# Patient Record
Sex: Male | Born: 1965 | Race: Asian | Hispanic: No | Marital: Married | Smoking: Never smoker
Health system: Southern US, Community
[De-identification: ages and names within clinical notes are randomized; demographics above are authoritative.]

## PROBLEM LIST (undated history)

## (undated) DIAGNOSIS — Z923 Personal history of irradiation: Secondary | ICD-10-CM

## (undated) DIAGNOSIS — I10 Essential (primary) hypertension: Secondary | ICD-10-CM

## (undated) DIAGNOSIS — C3490 Malignant neoplasm of unspecified part of unspecified bronchus or lung: Secondary | ICD-10-CM

## (undated) DIAGNOSIS — D492 Neoplasm of unspecified behavior of bone, soft tissue, and skin: Secondary | ICD-10-CM

## (undated) DIAGNOSIS — G939 Disorder of brain, unspecified: Secondary | ICD-10-CM

## (undated) HISTORY — DX: Neoplasm of unspecified behavior of bone, soft tissue, and skin: D49.2

## (undated) HISTORY — DX: Personal history of irradiation: Z92.3

## (undated) HISTORY — DX: Malignant neoplasm of unspecified part of unspecified bronchus or lung: C34.90

## (undated) HISTORY — DX: Disorder of brain, unspecified: G93.9

---

## 2013-11-05 ENCOUNTER — Emergency Department: Payer: Self-pay | Admitting: Emergency Medicine

## 2013-11-05 LAB — CBC
HCT: 45.6 % (ref 40.0–52.0)
HGB: 14.9 g/dL (ref 13.0–18.0)
MCH: 26.6 pg (ref 26.0–34.0)
MCHC: 32.6 g/dL (ref 32.0–36.0)
MCV: 82 fL (ref 80–100)
Platelet: 229 10*3/uL (ref 150–440)
RBC: 5.6 10*6/uL (ref 4.40–5.90)
RDW: 13.8 % (ref 11.5–14.5)
WBC: 9.6 10*3/uL (ref 3.8–10.6)

## 2013-11-05 LAB — BASIC METABOLIC PANEL
Anion Gap: 5 — ABNORMAL LOW (ref 7–16)
BUN: 15 mg/dL (ref 7–18)
Calcium, Total: 9.1 mg/dL (ref 8.5–10.1)
Chloride: 104 mmol/L (ref 98–107)
Co2: 30 mmol/L (ref 21–32)
Creatinine: 1.26 mg/dL (ref 0.60–1.30)
EGFR (African American): >60
EGFR (Non-African Amer.): >60
Glucose: 94 mg/dL (ref 65–99)
Osmolality: 278 (ref 275–301)
POTASSIUM: 3.2 mmol/L — AB (ref 3.5–5.1)
SODIUM: 139 mmol/L (ref 136–145)

## 2013-11-05 LAB — TROPONIN I

## 2014-02-11 ENCOUNTER — Ambulatory Visit: Payer: Self-pay | Admitting: Internal Medicine

## 2014-02-11 DIAGNOSIS — D492 Neoplasm of unspecified behavior of bone, soft tissue, and skin: Secondary | ICD-10-CM

## 2014-02-11 HISTORY — DX: Neoplasm of unspecified behavior of bone, soft tissue, and skin: D49.2

## 2014-03-04 LAB — CBC
HCT: 39.3 % — ABNORMAL LOW (ref 40.0–52.0)
HGB: 12.7 g/dL — ABNORMAL LOW (ref 13.0–18.0)
MCH: 25 pg — ABNORMAL LOW (ref 26.0–34.0)
MCHC: 32.2 g/dL (ref 32.0–36.0)
MCV: 78 fL — ABNORMAL LOW (ref 80–100)
PLATELETS: 400 10*3/uL (ref 150–440)
RBC: 5.07 10*6/uL (ref 4.40–5.90)
RDW: 15.2 % — AB (ref 11.5–14.5)
WBC: 15 10*3/uL — AB (ref 3.8–10.6)

## 2014-03-04 LAB — BASIC METABOLIC PANEL
Anion Gap: 11 (ref 7–16)
BUN: 17 mg/dL (ref 7–18)
CREATININE: 1.04 mg/dL (ref 0.60–1.30)
Calcium, Total: 9.8 mg/dL (ref 8.5–10.1)
Chloride: 96 mmol/L — ABNORMAL LOW (ref 98–107)
Co2: 28 mmol/L (ref 21–32)
EGFR (Non-African Amer.): >60
Glucose: 137 mg/dL — ABNORMAL HIGH (ref 65–99)
OSMOLALITY: 274 (ref 275–301)
POTASSIUM: 3.8 mmol/L (ref 3.5–5.1)
Sodium: 135 mmol/L — ABNORMAL LOW (ref 136–145)

## 2014-03-04 LAB — URINALYSIS, COMPLETE
BILIRUBIN, UR: NEGATIVE
BLOOD: NEGATIVE
Bacteria: NONE SEEN
Glucose,UR: NEGATIVE mg/dL (ref 0–75)
Ketone: NEGATIVE
LEUKOCYTE ESTERASE: NEGATIVE
NITRITE: NEGATIVE
PH: 6 (ref 4.5–8.0)
Protein: 30
RBC, UR: NONE SEEN /HPF (ref 0–5)
SQUAMOUS EPITHELIAL: NONE SEEN
Specific Gravity: 1.018 (ref 1.003–1.030)
WBC UR: NONE SEEN /HPF (ref 0–5)

## 2014-03-04 LAB — PRO B NATRIURETIC PEPTIDE: B-TYPE NATIURETIC PEPTID: 71 pg/mL (ref 0–125)

## 2014-03-04 LAB — TROPONIN I: Troponin-I: <0.02 ng/mL

## 2014-03-06 ENCOUNTER — Inpatient Hospital Stay: Payer: Self-pay | Admitting: Internal Medicine

## 2014-03-06 ENCOUNTER — Inpatient Hospital Stay (HOSPITAL_COMMUNITY)
Admission: AD | Admit: 2014-03-06 | Discharge: 2014-03-11 | DRG: 477 | Disposition: A | Discharge status: Home Health | Payer: BC Managed Care – PPO | Source: Other Acute Inpatient Hospital | Attending: Internal Medicine | Admitting: Internal Medicine

## 2014-03-06 DIAGNOSIS — D497 Neoplasm of unspecified behavior of endocrine glands and other parts of nervous system: Secondary | ICD-10-CM

## 2014-03-06 DIAGNOSIS — R279 Unspecified lack of coordination: Secondary | ICD-10-CM | POA: Diagnosis present

## 2014-03-06 DIAGNOSIS — R339 Retention of urine, unspecified: Secondary | ICD-10-CM | POA: Diagnosis present

## 2014-03-06 DIAGNOSIS — M8448XA Pathological fracture, other site, initial encounter for fracture: Secondary | ICD-10-CM | POA: Diagnosis present

## 2014-03-06 DIAGNOSIS — R29898 Other symptoms and signs involving the musculoskeletal system: Secondary | ICD-10-CM | POA: Diagnosis present

## 2014-03-06 DIAGNOSIS — G992 Myelopathy in diseases classified elsewhere: Secondary | ICD-10-CM | POA: Diagnosis present

## 2014-03-06 DIAGNOSIS — G959 Disease of spinal cord, unspecified: Secondary | ICD-10-CM | POA: Diagnosis present

## 2014-03-06 DIAGNOSIS — G9520 Unspecified cord compression: Secondary | ICD-10-CM

## 2014-03-06 DIAGNOSIS — D630 Anemia in neoplastic disease: Secondary | ICD-10-CM | POA: Diagnosis present

## 2014-03-06 DIAGNOSIS — C7951 Secondary malignant neoplasm of bone: Secondary | ICD-10-CM | POA: Diagnosis present

## 2014-03-06 DIAGNOSIS — G936 Cerebral edema: Secondary | ICD-10-CM | POA: Diagnosis present

## 2014-03-06 DIAGNOSIS — I10 Essential (primary) hypertension: Secondary | ICD-10-CM | POA: Diagnosis present

## 2014-03-06 DIAGNOSIS — C3490 Malignant neoplasm of unspecified part of unspecified bronchus or lung: Secondary | ICD-10-CM | POA: Diagnosis present

## 2014-03-06 DIAGNOSIS — K5900 Constipation, unspecified: Secondary | ICD-10-CM | POA: Diagnosis not present

## 2014-03-06 DIAGNOSIS — C799 Secondary malignant neoplasm of unspecified site: Secondary | ICD-10-CM

## 2014-03-06 DIAGNOSIS — Z800 Family history of malignant neoplasm of digestive organs: Secondary | ICD-10-CM | POA: Diagnosis not present

## 2014-03-06 DIAGNOSIS — C7952 Secondary malignant neoplasm of bone marrow: Principal | ICD-10-CM

## 2014-03-06 DIAGNOSIS — C7949 Secondary malignant neoplasm of other parts of nervous system: Secondary | ICD-10-CM | POA: Diagnosis present

## 2014-03-06 DIAGNOSIS — C7931 Secondary malignant neoplasm of brain: Secondary | ICD-10-CM | POA: Diagnosis present

## 2014-03-06 DIAGNOSIS — M545 Low back pain, unspecified: Secondary | ICD-10-CM | POA: Diagnosis present

## 2014-03-06 DIAGNOSIS — R531 Weakness: Secondary | ICD-10-CM | POA: Diagnosis present

## 2014-03-06 DIAGNOSIS — G8220 Paraplegia, unspecified: Secondary | ICD-10-CM | POA: Diagnosis not present

## 2014-03-06 HISTORY — DX: Essential (primary) hypertension: I10

## 2014-03-06 LAB — CBC WITH DIFFERENTIAL/PLATELET
BASOS ABS: 0.1 10*3/uL (ref 0.0–0.1)
Basophil %: 0.5 %
EOS PCT: 1.5 %
Eosinophil #: 0.2 10*3/uL (ref 0.0–0.7)
HCT: 36.7 % — ABNORMAL LOW (ref 40.0–52.0)
HGB: 11.7 g/dL — ABNORMAL LOW (ref 13.0–18.0)
Lymphocyte #: 1.8 10*3/uL (ref 1.0–3.6)
Lymphocyte %: 16.1 %
MCH: 24.9 pg — ABNORMAL LOW (ref 26.0–34.0)
MCHC: 31.8 g/dL — ABNORMAL LOW (ref 32.0–36.0)
MCV: 78 fL — ABNORMAL LOW (ref 80–100)
Monocyte #: 1.1 x10 3/mm — ABNORMAL HIGH (ref 0.2–1.0)
Monocyte %: 10.1 %
NEUTROS PCT: 71.8 %
Neutrophil #: 8.1 10*3/uL — ABNORMAL HIGH (ref 1.4–6.5)
PLATELETS: 364 10*3/uL (ref 150–440)
RBC: 4.7 10*6/uL (ref 4.40–5.90)
RDW: 14.8 % — ABNORMAL HIGH (ref 11.5–14.5)
WBC: 11.3 10*3/uL — AB (ref 3.8–10.6)

## 2014-03-06 LAB — BASIC METABOLIC PANEL
Anion Gap: 6 — ABNORMAL LOW (ref 7–16)
BUN: 14 mg/dL (ref 7–18)
CHLORIDE: 102 mmol/L (ref 98–107)
CREATININE: 1.06 mg/dL (ref 0.60–1.30)
Calcium, Total: 8.8 mg/dL (ref 8.5–10.1)
Co2: 29 mmol/L (ref 21–32)
EGFR (African American): >60
Glucose: 91 mg/dL (ref 65–99)
Osmolality: 274 (ref 275–301)
POTASSIUM: 3.9 mmol/L (ref 3.5–5.1)
Sodium: 137 mmol/L (ref 136–145)

## 2014-03-06 LAB — MAGNESIUM: MAGNESIUM: 1.9 mg/dL

## 2014-03-06 MED ORDER — TUBERCULIN PPD 5 UNIT/0.1ML ID SOLN
5.0000 [IU] | Freq: Once | INTRADERMAL | Status: DC
  Filled 2014-03-06: qty 0.1

## 2014-03-06 MED ORDER — DEXAMETHASONE SODIUM PHOSPHATE 4 MG/ML IJ SOLN
8.0000 mg | Freq: Two times a day (BID) | INTRAMUSCULAR | Status: DC
  Administered 2014-03-06 – 2014-03-07 (×2): 8 mg via INTRAVENOUS
  Filled 2014-03-06 (×2): qty 2

## 2014-03-06 MED ORDER — MORPHINE SULFATE 2 MG/ML IJ SOLN: 1.0000 mg | INTRAMUSCULAR | Status: DC | PRN

## 2014-03-06 MED ORDER — HEPARIN SODIUM (PORCINE) 5000 UNIT/ML IJ SOLN
5000.0000 [IU] | Freq: Three times a day (TID) | INTRAMUSCULAR | Status: DC
  Administered 2014-03-06 – 2014-03-07 (×2): 5000 [IU] via SUBCUTANEOUS
  Filled 2014-03-06 (×2): qty 1

## 2014-03-06 NOTE — Evaluation (Signed)
Received call from Dr. Manuella Ghazi at [facility] about patient with progressive lower extremity weakness. Was found to have multiple metastatic lesions on MRI of the spine yesterday (ER physician unclear at this MRI was obtained in the ER versus outpatient setting). MRI of the spine also showedT2 and L2 compression fractures as well as some canal stenosis. A followup chest CT in the ER today shows a 2.5 cm left upper lobe nodule concerning for bronchogenic carcinoma. Patient's been having some urinary retention. Worsening ability to ambulate.patient previously relatively healthy high functioning prior to onset of symptoms per report. Has received IV Decadron. Vital signs stable.Dr. Manuella Ghazi discuss case with neurosurgery via Dr. Ellene Route. His currently are recommending medical admission for workup of metastatic lesions.

## 2014-03-06 NOTE — H&P (Addendum)
Hospitalist Admission History and Physical  Patient name: Luis [name] Medical record number: [id number] Date of birth: 01-19-1966 Age: 48 y.o. Gender: male  Primary Care Provider: Pcp Not In System  Chief Complaint: back pain, weakness History of Present Illness:This is a 48 y.o. year old male with significant past medical history of HTN presenting with back pain, weakness. Pt states that he has had progressive back pain over the past 3-4 months. Pt states that he saw his PCP about the issue within the past 2-3 weeks. Had ? xrays that were benign. Was rxd anti inflammatories and tramadol/flexeril with minimal to mild improvement in sxs. Pt states that he developed progressive LE weakness and intermittent urinary retention over the course of the weekend. Also with abd pain, nausea, vomiting. Pt went to [facility] because of sxs. Was found to have multiple lytic and bony lesions on MRI L spine. Had follow up chest CT that showed roughly 3x2x1 cm RUL nodule concerning for ? Bronchogenic carcinoma with multiple pulmonary and bony nodules. Oncology was also consulted. SPEP and UPEP were ordered. Was planned for CT guided bx biopsy by H-O at [facility] However this was stopped given neuro complaints. PPD also ordered out of concern for possible TB (pt originally from [place]). Oncologist from [facility] discussed case with Dr. Ellene [name] from [facility] out of concern for sxs of spinal cord compression. Dr. Ellene [name] recommended transfer to [facility] for further eval to assess whether pt is surgical candidate. Pt received decadron 40mg  IV x1 prior to transfer. Reports minimal to mild improvement in sxs with decadron thus far. Currently with minimal abd pain, nausea, vomiting. Does have some mild leg pain bilaterally. Has not had colonoscopy. + family hx/o colon ca in mother (died from colon ca complications)  Assessment and Plan: Luis [name] is a 48 y.o. year old male presenting with back pain, weakness, lytic lesions   Active Problems:    Weakness   1-Weakness/back pain  -NS aware of case -noted lytic lesions on MRI and CT -f/u on recs -continue IV decadron  -H-O c/s while in house -PPD  2-Lytic lesions/chest mass -broad ddx including metatstatic and infectious etiologies -no pulm complaints currently  -resp isolation --PPD  -ca markers -f/u SPEP/UPEP from [facility] -f/u H-O recs  -may need pulm/ID input given ? Infectious etiology   3-HTN -continue home regimen   4-Urinary retention  -likely secondary to above -continue foley   FEN/GI: heart healthy diet  Prophylaxis: sub q heparin  Disposition: pending furhter evaluation  Code Status:Full Code    Patient Active Problem List   Diagnosis Date Noted  . Weakness 03/06/2014   Past Medical History: No past medical history on file.  Past Surgical History: No past surgical history on file.  Social History: History   Social History  . Marital Status: Married    Spouse Name: N/A    Number of Children: N/A  . Years of Education: N/A   Social History Main Topics  . Smoking status: Not on file  . Smokeless tobacco: Not on file  . Alcohol Use: Not on file  . Drug Use: Not on file  . Sexual Activity: Not on file   Other Topics Concern  . Not on file   Social History Narrative  . No narrative on file    Family History: No family history on file.  Allergies: Allergies not on file  Current Facility-Administered Medications  Medication Dose [name] Frequency Provider Last Rate Last Dose  . dexamethasone (DECADRON) injection 8 mg  8 mg Intravenous Q12H Shanda Howells, MD      . heparin injection 5,000 Units  5,000 Units Subcutaneous 3 times per day Shanda Howells, MD      . morphine 2 MG/ML injection 1-2 mg  1-2 mg Intravenous Q4H PRN Shanda Howells, MD       Review Of Systems: 12 point ROS negative except as noted above in HPI.  Physical Exam: Filed Vitals:   03/06/14 2208  BP: 154/99  Pulse: 112  Temp: 98.4 F (36.9 C)  Resp: 16     General: alert and cooperative HEENT: PERRLA and extra ocular movement intact Heart: S1, S2 normal, no murmur, rub or gallop, regular rate and rhythm Lungs: clear to auscultation, no wheezes or rales and unlabored breathing Abdomen: abdomen is soft without significant tenderness, masses, organomegaly or guarding Extremities: + weakness in LEs bilaterallyu  Skin:no rashes, no ecchymoses Neurology: CN, UEs WNL, + generalized weakness in LEs. No noted hypo/hypereflexia   Labs and Imaging:       Shanda Howells MD  Pager: [phone]

## 2014-03-07 ENCOUNTER — Inpatient Hospital Stay (HOSPITAL_COMMUNITY): Payer: BC Managed Care – PPO

## 2014-03-07 ENCOUNTER — Encounter (HOSPITAL_COMMUNITY)
Admission: AD | Disposition: A | Discharge status: Home Health | Payer: Self-pay | Source: Other Acute Inpatient Hospital | Attending: Internal Medicine

## 2014-03-07 ENCOUNTER — Encounter (HOSPITAL_COMMUNITY): Payer: BC Managed Care – PPO | Admitting: Anesthesiology

## 2014-03-07 ENCOUNTER — Encounter (HOSPITAL_COMMUNITY): Payer: Self-pay | Admitting: *Deleted

## 2014-03-07 ENCOUNTER — Inpatient Hospital Stay (HOSPITAL_COMMUNITY): Payer: BC Managed Care – PPO | Admitting: Anesthesiology

## 2014-03-07 DIAGNOSIS — R5383 Other fatigue: Secondary | ICD-10-CM

## 2014-03-07 DIAGNOSIS — R5381 Other malaise: Secondary | ICD-10-CM

## 2014-03-07 DIAGNOSIS — G959 Disease of spinal cord, unspecified: Secondary | ICD-10-CM

## 2014-03-07 DIAGNOSIS — D497 Neoplasm of unspecified behavior of endocrine glands and other parts of nervous system: Secondary | ICD-10-CM

## 2014-03-07 HISTORY — PX: LAMINECTOMY: SHX219

## 2014-03-07 LAB — CBC
HCT: 39.2 % (ref 39.0–52.0)
Hemoglobin: 12.7 g/dL — ABNORMAL LOW (ref 13.0–17.0)
MCH: 25.1 pg — AB (ref 26.0–34.0)
MCHC: 32.4 g/dL (ref 30.0–36.0)
MCV: 77.6 fL — AB (ref 78.0–100.0)
Platelets: 374 10*3/uL (ref 150–400)
RBC: 5.05 MIL/uL (ref 4.22–5.81)
RDW: 14.7 % (ref 11.5–15.5)
WBC: 9.4 10*3/uL (ref 4.0–10.5)

## 2014-03-07 LAB — CREATININE, SERUM
Creatinine, Ser: 0.82 mg/dL (ref 0.50–1.35)
GFR calc Af Amer: >90 mL/min (ref >90)
GFR calc non Af Amer: >90 mL/min (ref >90)

## 2014-03-07 LAB — PROTEIN ELECTROPHORESIS(ARMC)

## 2014-03-07 LAB — COMPREHENSIVE METABOLIC PANEL
ALK PHOS: 258 U/L — AB (ref 39–117)
ALT: 87 U/L — AB (ref 0–53)
AST: 53 U/L — AB (ref 0–37)
Albumin: 2.8 g/dL — ABNORMAL LOW (ref 3.5–5.2)
Anion gap: 12 (ref 5–15)
BUN: 15 mg/dL (ref 6–23)
CO2: 25 mEq/L (ref 19–32)
Calcium: 9.7 mg/dL (ref 8.4–10.5)
Chloride: 98 mEq/L (ref 96–112)
Creatinine, Ser: 0.76 mg/dL (ref 0.50–1.35)
GFR calc non Af Amer: >90 mL/min (ref >90)
GLUCOSE: 136 mg/dL — AB (ref 70–99)
POTASSIUM: 4.6 meq/L (ref 3.7–5.3)
SODIUM: 135 meq/L — AB (ref 137–147)
TOTAL PROTEIN: 8.5 g/dL — AB (ref 6.0–8.3)
Total Bilirubin: 0.3 mg/dL (ref 0.3–1.2)

## 2014-03-07 LAB — LACTATE DEHYDROGENASE: LDH: 213 U/L (ref 94–250)

## 2014-03-07 LAB — CBC WITH DIFFERENTIAL/PLATELET
Basophils Absolute: 0 10*3/uL (ref 0.0–0.1)
Basophils Relative: 0 % (ref 0–1)
EOS ABS: 0 10*3/uL (ref 0.0–0.7)
Eosinophils Relative: 0 % (ref 0–5)
HEMATOCRIT: 39.1 % (ref 39.0–52.0)
HEMOGLOBIN: 12.6 g/dL — AB (ref 13.0–17.0)
Lymphocytes Relative: 10 % — ABNORMAL LOW (ref 12–46)
Lymphs Abs: 0.9 10*3/uL (ref 0.7–4.0)
MCH: 24.6 pg — AB (ref 26.0–34.0)
MCHC: 32.2 g/dL (ref 30.0–36.0)
MCV: 76.4 fL — ABNORMAL LOW (ref 78.0–100.0)
MONO ABS: 0.1 10*3/uL (ref 0.1–1.0)
MONOS PCT: 1 % — AB (ref 3–12)
Neutro Abs: 7.8 10*3/uL — ABNORMAL HIGH (ref 1.7–7.7)
Neutrophils Relative %: 89 % — ABNORMAL HIGH (ref 43–77)
Platelets: 413 10*3/uL — ABNORMAL HIGH (ref 150–400)
RBC: 5.12 MIL/uL (ref 4.22–5.81)
RDW: 14.5 % (ref 11.5–15.5)
WBC: 8.8 10*3/uL (ref 4.0–10.5)

## 2014-03-07 LAB — HIV ANTIBODY (ROUTINE TESTING W REFLEX): HIV 1&2 Ab, 4th Generation: NONREACTIVE

## 2014-03-07 LAB — CANCER ANTIGEN 19-9: CA 19-9: 1.1 U/mL — ABNORMAL LOW (ref <35.0)

## 2014-03-07 LAB — CEA: CEA: 3.2 ng/mL (ref 0.0–5.0)

## 2014-03-07 SURGERY — THORACIC LAMINECTOMY FOR TUMOR
Anesthesia: General

## 2014-03-07 MED ORDER — PROPOFOL 10 MG/ML IV BOLUS
INTRAVENOUS | Status: DC | PRN
  Administered 2014-03-07: 200 mg via INTRAVENOUS

## 2014-03-07 MED ORDER — ONDANSETRON HCL 4 MG/2ML IJ SOLN
INTRAMUSCULAR | Status: DC | PRN
  Administered 2014-03-07: 4 mg via INTRAVENOUS

## 2014-03-07 MED ORDER — ONDANSETRON HCL 4 MG/2ML IJ SOLN: 4.0000 mg | INTRAMUSCULAR | Status: DC | PRN

## 2014-03-07 MED ORDER — METHOCARBAMOL 500 MG PO TABS
500.0000 mg | ORAL_TABLET | Freq: Four times a day (QID) | ORAL | Status: DC | PRN
  Administered 2014-03-07: 500 mg via ORAL

## 2014-03-07 MED ORDER — PHENOL 1.4 % MT LIQD: 1.0000 | OROMUCOSAL | Status: DC | PRN

## 2014-03-07 MED ORDER — PROPOFOL 10 MG/ML IV BOLUS
INTRAVENOUS | Status: AC
  Filled 2014-03-07: qty 20

## 2014-03-07 MED ORDER — 0.9 % SODIUM CHLORIDE (POUR BTL) OPTIME
TOPICAL | Status: DC | PRN
  Administered 2014-03-07: 1000 mL

## 2014-03-07 MED ORDER — GLYCOPYRROLATE 0.2 MG/ML IJ SOLN
INTRAMUSCULAR | Status: AC
  Filled 2014-03-07: qty 3

## 2014-03-07 MED ORDER — SODIUM CHLORIDE 0.9 % IJ SOLN: 3.0000 mL | INTRAMUSCULAR | Status: DC | PRN

## 2014-03-07 MED ORDER — BACITRACIN 50000 UNITS IM SOLR
INTRAMUSCULAR | Status: DC | PRN
  Administered 2014-03-07: 17:00:00

## 2014-03-07 MED ORDER — FENTANYL CITRATE 0.05 MG/ML IJ SOLN
INTRAMUSCULAR | Status: DC | PRN
  Administered 2014-03-07: 150 ug via INTRAVENOUS
  Administered 2014-03-07: 100 ug via INTRAVENOUS

## 2014-03-07 MED ORDER — HYDROCODONE-ACETAMINOPHEN 5-325 MG PO TABS: 1.0000 | ORAL_TABLET | ORAL | Status: DC | PRN

## 2014-03-07 MED ORDER — DEXAMETHASONE SODIUM PHOSPHATE 4 MG/ML IJ SOLN
INTRAMUSCULAR | Status: DC | PRN
  Administered 2014-03-07: 4 mg via INTRAVENOUS

## 2014-03-07 MED ORDER — MIDAZOLAM HCL 5 MG/5ML IJ SOLN
INTRAMUSCULAR | Status: DC | PRN
  Administered 2014-03-07: 2 mg via INTRAVENOUS

## 2014-03-07 MED ORDER — LIDOCAINE HCL (CARDIAC) 20 MG/ML IV SOLN
INTRAVENOUS | Status: AC
  Filled 2014-03-07: qty 10

## 2014-03-07 MED ORDER — SCOPOLAMINE 1 MG/3DAYS TD PT72
MEDICATED_PATCH | TRANSDERMAL | Status: DC | PRN
  Administered 2014-03-07: 1 via TRANSDERMAL

## 2014-03-07 MED ORDER — ROCURONIUM BROMIDE 50 MG/5ML IV SOLN
INTRAVENOUS | Status: AC
Start: 2014-03-07 — End: 2014-03-07
  Filled 2014-03-07: qty 1

## 2014-03-07 MED ORDER — ACETAMINOPHEN 325 MG PO TABS: 650.0000 mg | ORAL_TABLET | ORAL | Status: DC | PRN

## 2014-03-07 MED ORDER — LACTATED RINGERS IV SOLN
INTRAVENOUS | Status: DC | PRN
  Administered 2014-03-07: 16:00:00 via INTRAVENOUS

## 2014-03-07 MED ORDER — ACETAMINOPHEN 650 MG RE SUPP: 650.0000 mg | RECTAL | Status: DC | PRN

## 2014-03-07 MED ORDER — METHOCARBAMOL 500 MG PO TABS
ORAL_TABLET | ORAL | Status: AC
  Filled 2014-03-07: qty 1

## 2014-03-07 MED ORDER — LIDOCAINE HCL 4 % MT SOLN
OROMUCOSAL | Status: DC | PRN
  Administered 2014-03-07: 4 mL via TOPICAL

## 2014-03-07 MED ORDER — SCOPOLAMINE 1 MG/3DAYS TD PT72
MEDICATED_PATCH | TRANSDERMAL | Status: AC
  Filled 2014-03-07: qty 1

## 2014-03-07 MED ORDER — LIDOCAINE-EPINEPHRINE 1 %-1:100000 IJ SOLN
INTRAMUSCULAR | Status: DC | PRN
  Administered 2014-03-07: 5 mL

## 2014-03-07 MED ORDER — GADOBENATE DIMEGLUMINE 529 MG/ML IV SOLN
15.0000 mL | Freq: Once | INTRAVENOUS | Status: AC | PRN
  Administered 2014-03-07: 14 mL via INTRAVENOUS

## 2014-03-07 MED ORDER — NEOSTIGMINE METHYLSULFATE 10 MG/10ML IV SOLN
INTRAVENOUS | Status: AC
  Filled 2014-03-07: qty 1

## 2014-03-07 MED ORDER — HYDROMORPHONE HCL PF 1 MG/ML IJ SOLN
INTRAMUSCULAR | Status: AC
  Filled 2014-03-07: qty 1

## 2014-03-07 MED ORDER — THROMBIN 20000 UNITS EX SOLR
CUTANEOUS | Status: DC | PRN
  Administered 2014-03-07: 17:00:00 via TOPICAL

## 2014-03-07 MED ORDER — BUPIVACAINE HCL (PF) 0.5 % IJ SOLN
INTRAMUSCULAR | Status: DC | PRN
Start: 2014-03-07 — End: 2014-03-07
  Administered 2014-03-07: 5 mL

## 2014-03-07 MED ORDER — SODIUM CHLORIDE 0.9 % IV SOLN: 250.0000 mL | INTRAVENOUS | Status: DC

## 2014-03-07 MED ORDER — ONDANSETRON HCL 4 MG/2ML IJ SOLN
INTRAMUSCULAR | Status: AC
  Filled 2014-03-07: qty 2

## 2014-03-07 MED ORDER — SODIUM CHLORIDE 0.9 % IJ SOLN
3.0000 mL | Freq: Two times a day (BID) | INTRAMUSCULAR | Status: DC
  Administered 2014-03-07 – 2014-03-10 (×5): 3 mL via INTRAVENOUS

## 2014-03-07 MED ORDER — PHENYLEPHRINE HCL 10 MG/ML IJ SOLN
10.0000 mg | INTRAVENOUS | Status: DC | PRN
  Administered 2014-03-07: 50 ug/min via INTRAVENOUS

## 2014-03-07 MED ORDER — HYDROMORPHONE HCL PF 1 MG/ML IJ SOLN
0.2500 mg | INTRAMUSCULAR | Status: DC | PRN
  Administered 2014-03-07 (×3): 0.5 mg via INTRAVENOUS

## 2014-03-07 MED ORDER — OXYCODONE-ACETAMINOPHEN 5-325 MG PO TABS
1.0000 | ORAL_TABLET | ORAL | Status: DC | PRN
  Administered 2014-03-08: 2 via ORAL
  Administered 2014-03-08: 1 via ORAL
  Administered 2014-03-08: 2 via ORAL
  Administered 2014-03-10: 1 via ORAL
  Administered 2014-03-10: 2 via ORAL
  Filled 2014-03-07: qty 1
  Filled 2014-03-07 (×3): qty 2
  Filled 2014-03-07: qty 1

## 2014-03-07 MED ORDER — DEXAMETHASONE SODIUM PHOSPHATE 4 MG/ML IJ SOLN
INTRAMUSCULAR | Status: AC
  Filled 2014-03-07: qty 1

## 2014-03-07 MED ORDER — DEXAMETHASONE SODIUM PHOSPHATE 4 MG/ML IJ SOLN
4.0000 mg | Freq: Four times a day (QID) | INTRAMUSCULAR | Status: DC
  Administered 2014-03-08 – 2014-03-10 (×12): 4 mg via INTRAVENOUS
  Filled 2014-03-07 (×12): qty 1

## 2014-03-07 MED ORDER — ROCURONIUM BROMIDE 100 MG/10ML IV SOLN
INTRAVENOUS | Status: DC | PRN
  Administered 2014-03-07: 50 mg via INTRAVENOUS

## 2014-03-07 MED ORDER — ARTIFICIAL TEARS OP OINT
TOPICAL_OINTMENT | OPHTHALMIC | Status: DC | PRN
  Administered 2014-03-07: 1 via OPHTHALMIC

## 2014-03-07 MED ORDER — GLYCOPYRROLATE 0.2 MG/ML IJ SOLN
INTRAMUSCULAR | Status: DC | PRN
  Administered 2014-03-07: 0.6 mg via INTRAVENOUS

## 2014-03-07 MED ORDER — ALUM & MAG HYDROXIDE-SIMETH 200-200-20 MG/5ML PO SUSP: 30.0000 mL | Freq: Four times a day (QID) | ORAL | Status: DC | PRN

## 2014-03-07 MED ORDER — METOPROLOL SUCCINATE ER 25 MG PO TB24
50.0000 mg | ORAL_TABLET | Freq: Every day | ORAL | Status: DC
  Administered 2014-03-07 – 2014-03-10 (×4): 50 mg via ORAL
  Filled 2014-03-07 (×4): qty 2

## 2014-03-07 MED ORDER — HYDROMORPHONE HCL PF 1 MG/ML IJ SOLN: 0.5000 mg | INTRAMUSCULAR | Status: DC | PRN

## 2014-03-07 MED ORDER — MENTHOL 3 MG MT LOZG: 1.0000 | LOZENGE | OROMUCOSAL | Status: DC | PRN

## 2014-03-07 MED ORDER — NEOSTIGMINE METHYLSULFATE 10 MG/10ML IV SOLN
INTRAVENOUS | Status: DC | PRN
  Administered 2014-03-07: 4 mg via INTRAVENOUS

## 2014-03-07 MED ORDER — METHOCARBAMOL 1000 MG/10ML IJ SOLN
500.0000 mg | Freq: Four times a day (QID) | INTRAMUSCULAR | Status: DC | PRN
  Filled 2014-03-07: qty 5

## 2014-03-07 MED ORDER — PROMETHAZINE HCL 25 MG/ML IJ SOLN: 6.2500 mg | INTRAMUSCULAR | Status: DC | PRN

## 2014-03-07 MED ORDER — LISINOPRIL 20 MG PO TABS
40.0000 mg | ORAL_TABLET | Freq: Every day | ORAL | Status: DC
Start: 2014-03-07 — End: 2014-03-11
  Administered 2014-03-07 – 2014-03-11 (×5): 40 mg via ORAL
  Filled 2014-03-07: qty 4
  Filled 2014-03-07 (×4): qty 2

## 2014-03-07 MED ORDER — METOPROLOL TARTRATE 1 MG/ML IV SOLN
INTRAVENOUS | Status: DC | PRN
  Administered 2014-03-07: 5 mg via INTRAVENOUS

## 2014-03-07 MED ORDER — FENTANYL CITRATE 0.05 MG/ML IJ SOLN
INTRAMUSCULAR | Status: AC
  Filled 2014-03-07: qty 5

## 2014-03-07 MED ORDER — CEFAZOLIN SODIUM-DEXTROSE 2-3 GM-% IV SOLR
INTRAVENOUS | Status: DC | PRN
  Administered 2014-03-07: 2 g via INTRAVENOUS

## 2014-03-07 MED ORDER — OXYCODONE HCL 5 MG PO TABS
ORAL_TABLET | ORAL | Status: AC
  Filled 2014-03-07: qty 1

## 2014-03-07 MED ORDER — CEFAZOLIN SODIUM 1-5 GM-% IV SOLN
1.0000 g | Freq: Three times a day (TID) | INTRAVENOUS | Status: AC
  Administered 2014-03-08 (×2): 1 g via INTRAVENOUS
  Filled 2014-03-07 (×2): qty 50

## 2014-03-07 MED ORDER — OXYCODONE HCL 5 MG PO TABS
5.0000 mg | ORAL_TABLET | Freq: Once | ORAL | Status: AC | PRN
  Administered 2014-03-07: 5 mg via ORAL

## 2014-03-07 MED ORDER — MIDAZOLAM HCL 2 MG/2ML IJ SOLN
INTRAMUSCULAR | Status: AC
  Filled 2014-03-07: qty 2

## 2014-03-07 MED ORDER — OXYCODONE HCL 5 MG/5ML PO SOLN: 5.0000 mg | Freq: Once | ORAL | Status: AC | PRN

## 2014-03-07 MED ORDER — METOPROLOL TARTRATE 1 MG/ML IV SOLN
INTRAVENOUS | Status: AC
  Filled 2014-03-07: qty 5

## 2014-03-07 MED ORDER — HYDROCHLOROTHIAZIDE 25 MG PO TABS
25.0000 mg | ORAL_TABLET | Freq: Every day | ORAL | Status: DC
  Administered 2014-03-07 – 2014-03-11 (×5): 25 mg via ORAL
  Filled 2014-03-07 (×5): qty 1

## 2014-03-07 MED ORDER — THROMBIN 5000 UNITS EX SOLR
OROMUCOSAL | Status: DC | PRN
  Administered 2014-03-07 (×2): via TOPICAL

## 2014-03-07 MED ORDER — ALBUMIN HUMAN 5 % IV SOLN
INTRAVENOUS | Status: DC | PRN
  Administered 2014-03-07 (×2): via INTRAVENOUS

## 2014-03-07 MED ORDER — PHENYLEPHRINE HCL 10 MG/ML IJ SOLN
INTRAMUSCULAR | Status: DC | PRN
  Administered 2014-03-07: 160 ug via INTRAVENOUS
  Administered 2014-03-07 (×2): 120 ug via INTRAVENOUS

## 2014-03-07 SURGICAL SUPPLY — 75 items
BAG DECANTER FOR FLEXI CONT (MISCELLANEOUS) ×3 IMPLANT
BENZOIN TINCTURE PRP APPL 2/3 (GAUZE/BANDAGES/DRESSINGS) IMPLANT
BLADE SURG 11 STRL SS (BLADE) IMPLANT
BLADE SURG ROTATE 9660 (MISCELLANEOUS) ×3 IMPLANT
BUR MATCHSTICK NEURO 3.0 LAGG (BURR) ×3 IMPLANT
CANISTER SUCT 3000ML (MISCELLANEOUS) ×3 IMPLANT
CATH ROBINSON RED A/P 10FR (CATHETERS) IMPLANT
CLIP TI MEDIUM 6 (CLIP) IMPLANT
CLOSURE WOUND 1/2 X4 (GAUZE/BANDAGES/DRESSINGS)
CONT SPEC 4OZ CLIKSEAL STRL BL (MISCELLANEOUS) ×3 IMPLANT
DECANTER SPIKE VIAL GLASS SM (MISCELLANEOUS) ×3 IMPLANT
DERMABOND ADVANCED (GAUZE/BANDAGES/DRESSINGS) ×2
DERMABOND ADVANCED .7 DNX12 (GAUZE/BANDAGES/DRESSINGS) ×1 IMPLANT
DRAPE LAPAROTOMY 100X72 PEDS (DRAPES) IMPLANT
DRAPE LAPAROTOMY 100X72X124 (DRAPES) ×3 IMPLANT
DRAPE LONG LASER MIC (DRAPES) IMPLANT
DRAPE MICROSCOPE LEICA (MISCELLANEOUS) IMPLANT
DRAPE POUCH INSTRU U-SHP 10X18 (DRAPES) ×3 IMPLANT
DRSG OPSITE POSTOP 4X6 (GAUZE/BANDAGES/DRESSINGS) ×3 IMPLANT
DURAPREP 26ML APPLICATOR (WOUND CARE) ×3 IMPLANT
ELECT REM PT RETURN 9FT ADLT (ELECTROSURGICAL) ×3
ELECTRODE REM PT RTRN 9FT ADLT (ELECTROSURGICAL) ×1 IMPLANT
EVACUATOR 1/8 PVC DRAIN (DRAIN) IMPLANT
EVACUATOR 3/16  PVC DRAIN (DRAIN) ×2
EVACUATOR 3/16 PVC DRAIN (DRAIN) ×1 IMPLANT
GAUZE SPONGE 4X4 12PLY STRL (GAUZE/BANDAGES/DRESSINGS) IMPLANT
GAUZE SPONGE 4X4 16PLY XRAY LF (GAUZE/BANDAGES/DRESSINGS) IMPLANT
GLOVE BIOGEL PI IND STRL 8.5 (GLOVE) ×1 IMPLANT
GLOVE BIOGEL PI INDICATOR 8.5 (GLOVE) ×2
GLOVE ECLIPSE 6.5 STRL STRAW (GLOVE) ×6 IMPLANT
GLOVE ECLIPSE 8.5 STRL (GLOVE) ×6 IMPLANT
GLOVE EXAM NITRILE LRG STRL (GLOVE) IMPLANT
GLOVE EXAM NITRILE MD LF STRL (GLOVE) IMPLANT
GLOVE EXAM NITRILE XL STR (GLOVE) IMPLANT
GLOVE EXAM NITRILE XS STR PU (GLOVE) IMPLANT
GOWN STRL REUS W/ TWL LRG LVL3 (GOWN DISPOSABLE) IMPLANT
GOWN STRL REUS W/ TWL XL LVL3 (GOWN DISPOSABLE) ×2 IMPLANT
GOWN STRL REUS W/TWL 2XL LVL3 (GOWN DISPOSABLE) ×6 IMPLANT
GOWN STRL REUS W/TWL LRG LVL3 (GOWN DISPOSABLE)
GOWN STRL REUS W/TWL XL LVL3 (GOWN DISPOSABLE) ×4
HEMOSTAT POWDER SURGIFOAM 1G (HEMOSTASIS) IMPLANT
HEMOSTAT SURGICEL 2X14 (HEMOSTASIS) ×3 IMPLANT
KIT BASIN OR (CUSTOM PROCEDURE TRAY) ×3 IMPLANT
KIT ROOM TURNOVER OR (KITS) ×3 IMPLANT
NEEDLE HYPO 22GX1.5 SAFETY (NEEDLE) ×3 IMPLANT
NEEDLE SPNL 18GX3.5 QUINCKE PK (NEEDLE) ×3 IMPLANT
NS IRRIG 1000ML POUR BTL (IV SOLUTION) ×3 IMPLANT
PACK LAMINECTOMY NEURO (CUSTOM PROCEDURE TRAY) IMPLANT
PAD ARMBOARD 7.5X6 YLW CONV (MISCELLANEOUS) ×9 IMPLANT
PATTIES SURGICAL .25X.25 (GAUZE/BANDAGES/DRESSINGS) IMPLANT
PATTIES SURGICAL .5 X1 (DISPOSABLE) IMPLANT
PATTIES SURGICAL .5 X3 (DISPOSABLE) IMPLANT
PATTIES SURGICAL 1/4 X 3 (GAUZE/BANDAGES/DRESSINGS) IMPLANT
PATTIES SURGICAL 1X1 (DISPOSABLE) ×3 IMPLANT
RUBBERBAND STERILE (MISCELLANEOUS) IMPLANT
SPECIMEN JAR SMALL (MISCELLANEOUS) IMPLANT
SPONGE LAP 4X18 X RAY DECT (DISPOSABLE) IMPLANT
SPONGE SURGIFOAM ABS GEL 100 (HEMOSTASIS) ×3 IMPLANT
STAPLER SKIN PROX WIDE 3.9 (STAPLE) ×3 IMPLANT
STRIP CLOSURE SKIN 1/2X4 (GAUZE/BANDAGES/DRESSINGS) IMPLANT
SUT NURALON 4 0 TR CR/8 (SUTURE) IMPLANT
SUT PDS AB 1 CTX 36 (SUTURE) IMPLANT
SUT PROLENE 6 0 BV (SUTURE) IMPLANT
SUT SILK 3 0 TIES 17X18 (SUTURE)
SUT SILK 3-0 18XBRD TIE BLK (SUTURE) IMPLANT
SUT VIC AB 1 CT1 18XBRD ANBCTR (SUTURE) ×1 IMPLANT
SUT VIC AB 1 CT1 8-18 (SUTURE) ×2
SUT VIC AB 2-0 CP2 18 (SUTURE) ×3 IMPLANT
SUT VIC AB 3-0 SH 8-18 (SUTURE) ×6 IMPLANT
SYR 20ML ECCENTRIC (SYRINGE) ×3 IMPLANT
SYR CONTROL 10ML LL (SYRINGE) ×3 IMPLANT
TOWEL OR 17X24 6PK STRL BLUE (TOWEL DISPOSABLE) ×3 IMPLANT
TOWEL OR 17X26 10 PK STRL BLUE (TOWEL DISPOSABLE) ×3 IMPLANT
TRAY FOLEY CATH 14FRSI W/METER (CATHETERS) IMPLANT
WATER STERILE IRR 1000ML POUR (IV SOLUTION) ×3 IMPLANT

## 2014-03-07 NOTE — Progress Notes (Signed)
Patient ID: Luis [name], male   DOB: 05-21-1966, 48 y.o.   MRN: [id number] Legs are moving well. Patient has moderate pain in back of neck but very tolerable at this time.  Discussed pathology and situation with wife and friend who interprets for the patient's wife. They will need much support in the coming weeks and months.

## 2014-03-07 NOTE — Transfer of Care (Signed)
Immediate Anesthesia Transfer of Care Note  Patient: Luis [name]  Procedure(s) Performed: Procedure(s): T1-T2 Laminectomy with Decompression of Cord and Removal of Cancer  thoracic one/two (N/A)  Patient Location: PACU  Anesthesia Type:General  Level of Consciousness: sedated  Airway & Oxygen Therapy: Patient Spontanous Breathing and Patient connected to nasal cannula oxygen  Post-op Assessment: Report given to PACU RN and Post -op Vital signs reviewed and stable  Post vital signs: Reviewed and stable  Complications: No apparent anesthesia complications

## 2014-03-07 NOTE — Progress Notes (Signed)
Report called from [place]. Was told in report that patient received PPD test at 1314 on 8/24. Patient arrived to floor via carelink. MD notified. Pt calm and cooperative. C/o lower extremity weakness and numbness. Seven medications were counted and taken down to pharmacy for keeping. Form for meds is in chart. Currently in room settled and resting. Will continue to monitor.

## 2014-03-07 NOTE — Consult Note (Signed)
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 male.  HPI: Patient is a 48 year old right-handed male who emigrated from [place] about 10 years ago. He notes that in the past several months he developed a vague pain across the base of the neck and shoulders. A few days ago he started developed weakness in his legs. He was last able to walk on Friday. Since about Saturday or Sunday he couldn't void. He was hospitalized at [facility] and x-rays were performed which demonstrated lesions in multiple vertebrae of the thoracic spine. He was to have a biopsy of her rib mass and a lung lesion. He was noted to be significantly weak, and house consults and yesterday. It took the patient about 12 hours to be transferred here. This morning early he underwent an MRI of the thoracic spine. This reveals bony destructive lesions in the vertebrae of T1-T2 T3-T4 and T5. Worst area of spinal cord compression is at T1-T2. He's been advised regarding the need for surgical decompression of his thoracic spinal cord at T1-T2 as he has significant weakness in the legs he has also had urinary retention. This will also provide tissues so that an adequate biopsy diagnosis of the exact nature of the lesion can be performed.  No past medical history on file.  No past surgical history on file.  No family history on file.  Social History:  reports that he has never smoked. He does not have any smokeless tobacco history on file. He reports that he drinks about .5 ounces of alcohol per week. His drug history is not on file.  Allergies: No Known Allergies  Medications: I have reviewed the patient's current medications.  Results for orders placed during the hospital encounter of 03/06/14 (from the past 48 hour(s))  CBC     Status: Abnormal   Collection Time    03/07/14 12:38 AM      Result Value Ref Range   WBC 9.4  4.0 - 10.5 K/uL   RBC  5.05  4.22 - 5.81 MIL/uL   Hemoglobin 12.7 (*) 13.0 - 17.0 g/dL   HCT 39.2  39.0 - 52.0 %   MCV 77.6 (*) 78.0 - 100.0 fL   MCH 25.1 (*) 26.0 - 34.0 pg   MCHC 32.4  30.0 - 36.0 g/dL   RDW 14.7  11.5 - 15.5 %   Platelets 374  150 - 400 K/uL  CREATININE, SERUM     Status: None   Collection Time    03/07/14 12:38 AM      Result Value Ref Range   Creatinine, Ser 0.82  0.50 - 1.35 mg/dL   GFR calc non Af Amer >90  >90 mL/min   GFR calc Af Amer >90  >90 mL/min   Comment: (NOTE)     The eGFR has been calculated using the CKD EPI equation.     This calculation has not been validated in all clinical situations.     eGFR's persistently <90 mL/min signify possible Chronic Kidney     Disease.  HIV ANTIBODY (ROUTINE TESTING)     Status: None   Collection Time    03/07/14 12:38 AM      Result Value Ref Range   HIV 1&2 Ab, 4th Generation NONREACTIVE  NONREACTIVE   Comment: (NOTE)     A NONREACTIVE HIV Ag/Ab result does not exclude HIV infection since     the time frame  for seroconversion is variable. If acute HIV infection     is suspected, a HIV-1 RNA Qualitative TMA test is recommended.     HIV-1/2 Antibody Diff         Not indicated.     HIV-1 RNA, Qual TMA           Not indicated.     PLEASE NOTE: This information has been disclosed to you from records     whose confidentiality may be protected by state law. If your state     requires such protection, then the state law prohibits you from making     any further disclosure of the information without the specific written     consent of the person to whom it pertains, or as otherwise permitted     by law. A general authorization for the release of medical or other     information is NOT sufficient for this purpose.     The performance of this assay has not been clinically validated in     patients less than 20 years old.     Performed at [facility]     Status: Abnormal   Collection Time    03/07/14   6:40 AM      Result Value Ref Range   Sodium 135 (*) 137 - 147 mEq/L   Potassium 4.6  3.7 - 5.3 mEq/L   Chloride 98  96 - 112 mEq/L   CO2 25  19 - 32 mEq/L   Glucose, Bld 136 (*) 70 - 99 mg/dL   BUN 15  6 - 23 mg/dL   Creatinine, Ser 0.76  0.50 - 1.35 mg/dL   Calcium 9.7  8.4 - 10.5 mg/dL   Total Protein 8.5 (*) 6.0 - 8.3 g/dL   Albumin 2.8 (*) 3.5 - 5.2 g/dL   AST 53 (*) 0 - 37 U/L   ALT 87 (*) 0 - 53 U/L   Alkaline Phosphatase 258 (*) 39 - 117 U/L   Total Bilirubin 0.3  0.3 - 1.2 mg/dL   GFR calc non Af Amer >90  >90 mL/min   GFR calc Af Amer >90  >90 mL/min   Comment: (NOTE)     The eGFR has been calculated using the CKD EPI equation.     This calculation has not been validated in all clinical situations.     eGFR's persistently <90 mL/min signify possible Chronic Kidney     Disease.   Anion gap 12  5 - 15  CBC WITH DIFFERENTIAL     Status: Abnormal   Collection Time    03/07/14  6:40 AM      Result Value Ref Range   WBC 8.8  4.0 - 10.5 K/uL   RBC 5.12  4.22 - 5.81 MIL/uL   Hemoglobin 12.6 (*) 13.0 - 17.0 g/dL   HCT 39.1  39.0 - 52.0 %   MCV 76.4 (*) 78.0 - 100.0 fL   MCH 24.6 (*) 26.0 - 34.0 pg   MCHC 32.2  30.0 - 36.0 g/dL   RDW 14.5  11.5 - 15.5 %   Platelets 413 (*) 150 - 400 K/uL   Neutrophils Relative % 89 (*) 43 - 77 %   Neutro Abs 7.8 (*) 1.7 - 7.7 K/uL   Lymphocytes Relative 10 (*) 12 - 46 %   Lymphs Abs 0.9  0.7 - 4.0 K/uL   Monocytes Relative 1 (*) 3 - 12 %  Monocytes Absolute 0.1  0.1 - 1.0 K/uL   Eosinophils Relative 0  0 - 5 %   Eosinophils Absolute 0.0  0.0 - 0.7 K/uL   Basophils Relative 0  0 - 1 %   Basophils Absolute 0.0  0.0 - 0.1 K/uL  LACTATE DEHYDROGENASE     Status: None   Collection Time    03/07/14  6:40 AM      Result Value Ref Range   LDH 213  94 - 250 U/L    Mr Thoracic Spine W Wo Contrast  03/07/2014   ADDENDUM REPORT: 03/07/2014 09:28  ADDENDUM: Critical Value/emergent results were called by telephone at the time of  interpretation on 03/07/2014 at 0923 hr to Dr. Charlies Silvers who verbally acknowledged these results.   Electronically Signed   By: Lars Pinks M.D.   On: 03/07/2014 09:28   03/07/2014   CLINICAL DATA:  48 year old male with progressive Back pain and paraplegia. 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  EXAM: MRI THORACIC SPINE WITHOUT AND WITH CONTRAST  TECHNIQUE: Multiplanar and multiecho pulse sequences of the thoracic spine were obtained without and with intravenous contrast.  CONTRAST:  47m MULTIHANCE GADOBENATE DIMEGLUMINE 529 MG/ML IV SOLN  COMPARISON:  Chest CT 03/06/2014 and earlier.  FINDINGS: Bulky enhancing tumor throughout the upper thoracic spine and paraspinal soft tissues. Confluent disease affecting the T1, T2, and T3 posterior elements and medial ribs, greater on the left. Nearly circumferential epidural tumor results, with mild cord compression at T2 (series 7, image 7), and T3 (series 7, image 11). Obliteration of the left T1 and T2 neural foramina.  Confluent tumor throughout the T4 vertebra. Ventral epidural extension on the right without cord compression at this time.  Pathologic compression fracture of T5, with bulky pedicle and posterior element tumor on the left resulting in mild cord compression (series 7, image 19). The left T5 neural foramen is obliterated.  Vertebral and epidural tumor at T6 without cord compression at this time.  Bulky and expansile right T7 rib mass (series 7, image 25). Similar expansile left T10 rib mass. Lower thoracic vertebral body metastases. Without additional epidural tumor  No abnormal intradural enhancement identified.  Stable lung parenchyma and visualized abdominal viscera from recent CTs.  IMPRESSION: 1. Confluent metastatic disease to the upper thoracic spine. Epidural and paraspinal tumor resulting in mild cord compression at the T2, T3, and T5 spinal cord levels. No cord edema identified. 2. Tumor filling the left T1, T2, and T5 neural foramina.  3. Epidural tumor without cord compression also at the T4 and T6 levels. 4. Metastatic destruction of several posterior ribs.  Electronically Signed: By: LLars PinksM.D. On: 03/07/2014 08:51    Review of Systems  Eyes: Negative.   Respiratory: Negative.   Cardiovascular: Negative.   Gastrointestinal: Negative.   Musculoskeletal: Positive for back pain and neck pain.  Skin: Negative.   Neurological: Positive for focal weakness and weakness.  Endo/Heme/Allergies: Negative.   Psychiatric/Behavioral: Negative.    Blood pressure 138/93, pulse 95, temperature 98.6 F (37 C), temperature source Oral, resp. rate 18, height 5' 7" (1.702 m), weight 65.137 kg (143 lb 9.6 oz), SpO2 100.00%. Physical Exam  Constitutional: He is oriented to person, place, and time. He appears well-developed and well-nourished.  HENT:  Head: Normocephalic and atraumatic.  Eyes: Conjunctivae and EOM are normal. Pupils are equal, round, and reactive to light.  Neck: Normal range of motion. Neck supple.  Cardiovascular: Normal rate and regular  rhythm.   Respiratory: Effort normal and breath sounds normal.  GI: Soft. Bowel sounds are normal.  Musculoskeletal:  Weakness in lower extremities proximally and distally to 4 minus out of 5 in iliopsoas and quads 4-5 in tibialis anterior gastrocs. Absent deep tendon reflexes in the patellae and Achilles both.  Neurological: He is alert and oriented to person, place, and time.  Cranial nerve examination revealed pupils are 3 mm briskly reactive light and accommodation extraocular movements are full face is symmetric to grimace tongue and uvula in the midline sclera and conjunctiva are clear there is no evidence of cortical drift in the upper extremities upper extremity strength is completely within the limits of normal lower external he strength reveals that there is weakness proximally and distally to 4-5. Is hyporeflexive. Sensory examination appears to be intact  Skin: Skin is  warm and dry.  Psychiatric: He has a normal mood and affect. His behavior is normal. Judgment and thought content normal.    Assessment/Plan: Metastatic lesion at T1 and T2.  Patient is to undergo surgical decompression via laminectomy at the T1-T2 level for ultimate diagnosis of what appears to be a malignancy in his thoracic spine.  Corie Allis J 03/07/2014, 3:01 PM

## 2014-03-07 NOTE — Progress Notes (Signed)
Patient ID: Luis [name], male   DOB: [birth year], 48 y.o.   MRN: [id number] [facility] PROGRESS NOTE  Luis [name] DUK:[id number] DOB: Feb 24, 1966 DOA: 03/06/2014 PCP: Pcp Not In System  Brief narrative: 48 year old male from [place] who presented to [facility] ED 03/06/2014 with vague neck and shoulder pain for past few months which has progressed to involve weakness in lower extremities. This then progressed to inability to void. He was hospitalized at [facility] and x-rays were performed which demonstrated lesions in multiple vertebrae of the thoracic spine. He was to have a biopsy of her rib mass and a lung lesion. He was noted to be significantly weak and has presented to [facility] 03/06/2014. MRI of thoracic spine which revealed bony destructive lesions in the vertebrae of T1-T2 T3-T4 and T5. Worst area of spinal cord compression is at T1-T2. Pt underwent surgical decompression via laminectomy at T1-T2 level 03/07/2014 by Dr. Ellene Route of neurosurgery.  Assessment/Plan:    Principal Problem: Lower extremity weakness with urinary retention in the setting of spinal cord compression at T1-T2  Status post surgical decompression via laminectomy at the T1-T2 level for ultimate diagnosis of what appears to be a malignancy in his thoracic spine.  Order also placed for radiation oncology.  Continue pain management efforts with dilaudid 0.5 mg IV PRN for severe pain, oxycodone PO PRN moderate pain; also on robaxin scheduled dose  Continue decadron 4 mg IV every 6 hours.  Active Problems: Hypertension  Continue lisinopril, hctz, metoprolol  DVT Prophylaxis   SCD's bilaterally    Code Status: Full.  Family Communication:  plan of care discussed with the patient Disposition Plan: Home when stable.    IV Access:   Peripheral IV Procedures and diagnostic studies:    Mr Thoracic Spine W Wo Contrast 03/07/2014   ADDENDUM REPORT: 03/07/2014 09:28  ADDENDUM: Critical Value/emergent results  were called by telephone at the time of interpretation on 03/07/2014 at 0923 hr to Dr. Charlies Silvers who verbally acknowledged these results.   Electronically Signed   By: Lars Pinks M.D.   On: 03/07/2014 09:28 1. Confluent metastatic disease to the upper thoracic spine. Epidural and paraspinal tumor resulting in mild cord compression at the T2, T3, and T5 spinal cord levels. No cord edema identified. 2. Tumor filling the left T1, T2, and T5 neural foramina. 3. Epidural tumor without cord compression also at the T4 and T6 levels. 4. Metastatic destruction of several posterior ribs.    Dg Thoracic Spine 1 View 03/07/2014: Limited exam showing no definite acute thoracic spine abnormalities.     Medical Consultants:   Neurosurgery (Dr. Ellene Route) Radiation oncology  Other Consultants:   Physical therapy  Anti-Infectives:   Cefazolin preoperatively and postoperatively 03/07/2014    Leisa Lenz, MD  [facility] Pager [phone]  If 7PM-7AM, please contact night-coverage [email or web address] Password [email or web address] 03/07/2014, 10:18 PM   LOS: 1 day    HPI/Subjective: No acute overnight events.  Objective: Filed Vitals:   03/07/14 2003 03/07/14 2015 03/07/14 2044 03/07/14 2156  BP: 115/73  118/82 139/82  Pulse:  96 98   Temp:  98.3 F (36.8 C) 97.8 F (36.6 C)   TempSrc:   Oral   Resp:  13 16   Height:      Weight:      SpO2:  100% 97%     Intake/Output Summary (Last 24 hours) at 03/07/14 2218 Last data filed at 03/07/14 2020  Gross per 24 hour  Intake   1730  ml  Output   3000 ml  Net  -1270 ml    Exam:   General:  Pt is alert, follows commands appropriately, not in acute distress  Cardiovascular: Regular rate and rhythm, S1/S2, no murmurs  Respiratory: Clear to auscultation bilaterally, no wheezing, no crackles, no rhonchi  Abdomen: Soft, non tender, non distended, bowel sounds present  Extremities: No edema, pulses DP and PT palpable bilaterally  Neuro: Grossly nonfocal  Data  Reviewed: Basic Metabolic Panel:  Recent Labs Lab 03/07/14 0038 03/07/14 0640  NA  --  135*  K  --  4.6  CL  --  98  CO2  --  25  GLUCOSE  --  136*  BUN  --  15  CREATININE 0.82 0.76  CALCIUM  --  9.7   Liver Function Tests:  Recent Labs Lab 03/07/14 0640  AST 53*  ALT 87*  ALKPHOS 258*  BILITOT 0.3  PROT 8.5*  ALBUMIN 2.8*   No results found for this basename: LIPASE, AMYLASE,  in the last 168 hours No results found for this basename: AMMONIA,  in the last 168 hours CBC:  Recent Labs Lab 03/07/14 0038 03/07/14 0640  WBC 9.4 8.8  NEUTROABS  --  7.8*  HGB 12.7* 12.6*  HCT 39.2 39.1  MCV 77.6* 76.4*  PLT 374 413*   Cardiac Enzymes: No results found for this basename: CKTOTAL, CKMB, CKMBINDEX, TROPONINI,  in the last 168 hours BNP: No components found with this basename: POCBNP,  CBG: No results found for this basename: GLUCAP,  in the last 168 hours  No results found for this or any previous visit (from the past 240 hour(s)).   Scheduled Meds: . [START ON 03/08/2014]  ceFAZolin (ANCEF) IV  1 g Intravenous Q8H  . hydrochlorothiazide  25 mg Oral Daily  . HYDROmorphone      . HYDROmorphone      . lisinopril  40 mg Oral Daily  . methocarbamol      . metoprolol succinate  50 mg Oral QHS  . oxyCODONE      . sodium chloride  3 mL Intravenous Q12H  . tuberculin  5 Units Intradermal Once   Continuous Infusions: . sodium chloride

## 2014-03-07 NOTE — Progress Notes (Signed)
Utilization Review Completed.Donne Anon T8/25/2015

## 2014-03-07 NOTE — Op Note (Signed)
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 Anesthesia: Gen. endotracheal Indications: Patient is a 48 year old male who had developed some upper neck and shoulder pain in addition to weakness in his legs his found to have deposits of cancer in multiple vertebrae and ribs and lesions lung. He's not had a tissue diagnosis. Because he has spinal cord compression and weakness in the legs he is taken to the operating room to undergo decompression at T1 and T2. The MRI demonstrates that he has expansive cancer involving only T1-T2 but also T3-T4 and T5.  Procedure: Patient was brought to the operating room supine on a stretcher. After the smooth induction of general endotracheal anesthesia, he was turned prone. The back was prepped with alcohol and DuraPrep in the region of the cervical thoracic junction. This region was draped sterilely. Prior to this the shoulders and arms had been padded and protected very carefully. These were placed at the patient's side. A localizing radiograph was obtained with a needle identifying the T1-T2 interspace. A vertical incision was created in the midline measuring 56 cm in length. Dissection was taken through the cervical dorsal fascia into the rectal dorsal fascia which was opened on either side of midline. Incision was extended to 10 cm. A subperiosteal dissection was then performed at T1-T2, however on the left side there was noted be substantially weakened cortical structure of bone at the level of T1 and T2 vertebrae. This was clearly invaded with a gelatinous material which was a cancerous lesion itself. This was resected and a laminectomy  of T1 and T2 were performed with a Leksell rongeur removing the very softened bone. The epidural space was identified. Gelatinous tumor was encountered in this region and it was sent for frozen section diagnosis which was later returned as a papillary malignant tumor. Many pieces of specimen were then sent for permanent section as a decompression ensued removing the laminar arches of L1 and L2 and decompressing the dura and the epidural space from left to right. The tumor extended into the muscular tissues inferiorly. This was partially resected but the tumor itself was proving to be rather hemorrhagic. A number of efforts at obtaining hemostasis were performed including packing with Gelfoam pledgets packing with Surgifoam and careful cautery of the tissues along with further packing methods. Ultimately it hemostasis was achieved. The dura was carefully inspected to make sure that the decompression was adequate at the T1 and T2 levels. This felt that the tumor did extend ventrally into the vertebral bodies. They still remained with some structure in the were left alone. Once adequate hemostasis was achieved, a large Hemovac drain was placed into the depths of the wound and brought out through separate stab incision. The thoracic of dorsal fascia was then closed with #1 Vicryl in interrupted fashion 2-0 Vicryl is used to keep pain is clean 3-0 Vicryl subcuticularly. Blood loss was estimated at 500 cc. The patient was returned to the recovery room in stable condition.

## 2014-03-07 NOTE — Anesthesia Procedure Notes (Addendum)
Procedure Name: Intubation Date/Time: 03/07/2014 4:10 PM Performed by: Trixie Deis A Pre-anesthesia Checklist: Patient identified, Timeout performed, Emergency Drugs available, Patient being monitored and Suction available Patient Re-evaluated:Patient Re-evaluated prior to inductionOxygen Delivery Method: Circle system utilized Preoxygenation: Pre-oxygenation with 100% oxygen Intubation Type: IV induction Ventilation: Mask ventilation without difficulty Laryngoscope Size: Mac and 3 Grade View: Grade I Tube type: Oral Tube size: 7.5 mm Number of attempts: 1 Airway Equipment and Method: Stylet and LTA kit utilized Placement Confirmation: ETT inserted through vocal cords under direct vision,  breath sounds checked- equal and bilateral and positive ETCO2 Secured at: 23 cm Tube secured with: Tape Dental Injury: Teeth and Oropharynx as per pre-operative assessment

## 2014-03-07 NOTE — Anesthesia Preprocedure Evaluation (Signed)
Anesthesia Evaluation  Patient identified by MRN, date of birth, ID band Patient awake    Reviewed: Allergy & Precautions, H&P , NPO status , Patient's Chart, lab work & pertinent test results  History of Anesthesia Complications Negative for: history of anesthetic complications  Airway Mallampati: II TM Distance: >3 FB Neck ROM: Full    Dental  (+) Teeth Intact, Dental Advisory Given   Pulmonary neg pulmonary ROS,    Pulmonary exam normal       Cardiovascular hypertension, Pt. on home beta blockers and Pt. on medications     Neuro/Psych negative psych ROS   GI/Hepatic negative GI ROS, Neg liver ROS,   Endo/Other  negative endocrine ROS  Renal/GU negative Renal ROS     Musculoskeletal   Abdominal   Peds  Hematology   Anesthesia Other Findings   Reproductive/Obstetrics                           Anesthesia Physical Anesthesia Plan  ASA: II  Anesthesia Plan: General   Post-op Pain Management:    Induction: Intravenous  Airway Management Planned: Oral ETT  Additional Equipment:   Intra-op Plan:   Post-operative Plan: Extubation in OR  Informed Consent: I have reviewed the patients History and Physical, chart, labs and discussed the procedure including the risks, benefits and alternatives for the proposed anesthesia with the patient or authorized representative who has indicated his/her understanding and acceptance.   Dental advisory given  Plan Discussed with: CRNA, Anesthesiologist and Surgeon  Anesthesia Plan Comments:         Anesthesia Quick Evaluation

## 2014-03-08 ENCOUNTER — Encounter (HOSPITAL_COMMUNITY): Payer: Self-pay | Admitting: Physical Medicine and Rehabilitation

## 2014-03-08 ENCOUNTER — Ambulatory Visit: Payer: BC Managed Care – PPO | Attending: Radiation Oncology | Admitting: Radiation Oncology

## 2014-03-08 DIAGNOSIS — D630 Anemia in neoplastic disease: Secondary | ICD-10-CM

## 2014-03-08 DIAGNOSIS — R350 Frequency of micturition: Secondary | ICD-10-CM

## 2014-03-08 DIAGNOSIS — I10 Essential (primary) hypertension: Secondary | ICD-10-CM | POA: Diagnosis present

## 2014-03-08 DIAGNOSIS — C801 Malignant (primary) neoplasm, unspecified: Secondary | ICD-10-CM

## 2014-03-08 DIAGNOSIS — D497 Neoplasm of unspecified behavior of endocrine glands and other parts of nervous system: Secondary | ICD-10-CM | POA: Diagnosis present

## 2014-03-08 DIAGNOSIS — D72819 Decreased white blood cell count, unspecified: Secondary | ICD-10-CM

## 2014-03-08 DIAGNOSIS — C7952 Secondary malignant neoplasm of bone marrow: Principal | ICD-10-CM

## 2014-03-08 DIAGNOSIS — C7951 Secondary malignant neoplasm of bone: Principal | ICD-10-CM

## 2014-03-08 DIAGNOSIS — M25519 Pain in unspecified shoulder: Secondary | ICD-10-CM

## 2014-03-08 DIAGNOSIS — R911 Solitary pulmonary nodule: Secondary | ICD-10-CM

## 2014-03-08 DIAGNOSIS — M549 Dorsalgia, unspecified: Secondary | ICD-10-CM

## 2014-03-08 LAB — COMPREHENSIVE METABOLIC PANEL
ALT: 65 U/L — ABNORMAL HIGH (ref 0–53)
ANION GAP: 11 (ref 5–15)
AST: 29 U/L (ref 0–37)
Albumin: 2.7 g/dL — ABNORMAL LOW (ref 3.5–5.2)
Alkaline Phosphatase: 180 U/L — ABNORMAL HIGH (ref 39–117)
BUN: 16 mg/dL (ref 6–23)
CHLORIDE: 100 meq/L (ref 96–112)
CO2: 26 meq/L (ref 19–32)
Calcium: 8.8 mg/dL (ref 8.4–10.5)
Creatinine, Ser: 0.76 mg/dL (ref 0.50–1.35)
Glucose, Bld: 123 mg/dL — ABNORMAL HIGH (ref 70–99)
Potassium: 4.3 mEq/L (ref 3.7–5.3)
Sodium: 137 mEq/L (ref 137–147)
Total Protein: 6.9 g/dL (ref 6.0–8.3)

## 2014-03-08 LAB — CBC WITH DIFFERENTIAL/PLATELET
BASOS ABS: 0 10*3/uL (ref 0.0–0.1)
Basophils Relative: 0 % (ref 0–1)
EOS PCT: 0 % (ref 0–5)
Eosinophils Absolute: 0 10*3/uL (ref 0.0–0.7)
HCT: 30.4 % — ABNORMAL LOW (ref 39.0–52.0)
Hemoglobin: 9.7 g/dL — ABNORMAL LOW (ref 13.0–17.0)
LYMPHS ABS: 1.1 10*3/uL (ref 0.7–4.0)
LYMPHS PCT: 6 % — AB (ref 12–46)
MCH: 24.7 pg — ABNORMAL LOW (ref 26.0–34.0)
MCHC: 31.9 g/dL (ref 30.0–36.0)
MCV: 77.4 fL — AB (ref 78.0–100.0)
Monocytes Absolute: 0.8 10*3/uL (ref 0.1–1.0)
Monocytes Relative: 4 % (ref 3–12)
NEUTROS ABS: 16.9 10*3/uL — AB (ref 1.7–7.7)
NEUTROS PCT: 90 % — AB (ref 43–77)
Platelets: 335 10*3/uL (ref 150–400)
RBC: 3.93 MIL/uL — AB (ref 4.22–5.81)
RDW: 14.6 % (ref 11.5–15.5)
WBC: 18.8 10*3/uL — ABNORMAL HIGH (ref 4.0–10.5)

## 2014-03-08 LAB — FREE PSA
PSA, Free Pct: 24 % (ref >25)
PSA, Free: 0.18 ng/mL

## 2014-03-08 LAB — PSA: PSA: 0.76 ng/mL (ref <=4.00)

## 2014-03-08 LAB — AFP TUMOR MARKER: AFP TUMOR MARKER: 1.9 ng/mL (ref <6.1)

## 2014-03-08 LAB — PROT IMMUNOELECTROPHORES(ARMC)

## 2014-03-08 MED ORDER — SODIUM CHLORIDE 0.9 % IV SOLN
INTRAVENOUS | Status: DC
  Administered 2014-03-08 (×3): via INTRAVENOUS

## 2014-03-08 NOTE — Consult Note (Signed)
Physical Medicine and Rehabilitation Consult  Reason for Consult: Metastatic thoracic cancer Referring Physician: Dr. Tana Coast.   HPI: Luis [name] is a 48 y.o. [place] male who developed vague neck and shoulder pain a few months ago. He started developing BLE weakness last week and was last able to walk on 03/03/14. He developed difficulty with urinary retention, constipation as well as abdominal pain. He was evaluated at [facility] and was found to have multiple lytic bony lesion involving Lumbar sine as well as RUL nodule concerning for bronchogenic CA. SPEP/UPEP ordered as well as  PPD due to concerns of possible TB. MRI spine done 03/07/14 revealing  destructive lesions in the vertebrae of T1-T2 T3-T4 and T5 as well as spinal cord compression --worse at T1-T2, bulky expansile T7 rib mass and metastatic destruction of several posterior ribs. He was transferred to [facility] for treatment. He was evaluated by Dr. Ellene Route and taken to OR for T1-T2 laminectomy and decompression of spinal cord, resection of malignant metastatic tumor. Frozen section positive for papillary malignant tumor and final pathology pending. Heme onc consulted for input and work up ongoing. PT evaluation done this am and patient continues to be limited by BLE weakness with ataxia. MD, PT recommending CIR.    Review of Systems  HENT: Negative for hearing loss.   Eyes: Negative for blurred vision and double vision.       Wears reading glasses  Respiratory: Negative for shortness of breath and wheezing.   Cardiovascular: Negative for palpitations and orthopnea.  Gastrointestinal: Negative for heartburn, nausea, abdominal pain and constipation (+BM today).  Genitourinary:       Urinary retention  Musculoskeletal: Positive for myalgias and neck pain.  Neurological: Positive for sensory change and focal weakness (BLE). Negative for headaches.  Psychiatric/Behavioral: Negative for hallucinations, memory loss and substance abuse.     Past  Medical History  Diagnosis Date  . HTN (hypertension)     No past surgical history on file.  Family History  Problem Relation Age of Onset  . Cancer Mother   . Heart disease Father   . Hypertension Sister   . Hypertension Brother     Social History:  Leanne Chang with wife and two children (18 months and 10  Years). Wife works but has supportive friends Works as a Architectural technologist at [employer]. Move from [place] 10 years ago. He reports that he has never smoked. He does not have any smokeless tobacco history on file. He reports that he drinks about .5 ounces of alcohol per week. His drug history is not on file.   Allergies: No Known Allergies   Medications Prior to Admission  Medication Sig Dispense Refill  . cyclobenzaprine (FLEXERIL) 5 MG tablet Take 5 mg by mouth every 8 (eight) hours as needed. For muscle spasms      . hydrochlorothiazide (HYDRODIURIL) 25 MG tablet Take 25 mg by mouth daily.      . hydrocortisone valerate ointment (WEST-CORT) 0.2 % Apply 1 application topically 2 (two) times daily. Applied to affected area twice daily      . lisinopril (PRINIVIL,ZESTRIL) 40 MG tablet Take 40 mg by mouth daily.      . meloxicam (MOBIC) 15 MG tablet Take 15 mg by mouth daily.      . metoprolol succinate (TOPROL-XL) 50 MG 24 hr tablet Take 50 mg by mouth at bedtime.      . traMADol (ULTRAM) 50 MG tablet Take 50 mg by mouth every 8 (eight)  hours as needed. For pain        Home: Home Living Family/patient expects to be discharged to:: Private residence Living Arrangements: Spouse/significant other;Children Available Help at Discharge: Family;Available 24 hours/day Type of Home: Apartment Home Access: Stairs to enter Entrance Stairs-Number of Steps: 1 (threshold step) Entrance Stairs-Rails: None Home Layout: One level Home Equipment: None Additional Comments: pt works a Architectural technologist at [employer] History: Prior Function Level of Independence:  Independent Functional Status:  Mobility: Bed Mobility Overal bed mobility: Needs Assistance Bed Mobility: Rolling;Sidelying to Sit Rolling: Supervision Sidelying to sit: Min guard General bed mobility comments: cues for log rolling technique; min guard to facilitate sidelying to sit position  Transfers Overall transfer level: Needs assistance Equipment used: 1 person hand held assist Transfers: Sit to/from Stand Sit to Stand: Min assist General transfer comment: (A) to maintain balance with sit to stand; reaching for UE support; pt with shaky; unsteady LEs due to generalized weakness  Ambulation/Gait Ambulation/Gait assistance: Mod assist Ambulation Distance (Feet): 10 Feet Assistive device: 1 person hand held assist (with IV support in opposite UE) Gait Pattern/deviations: Step-to pattern;Decreased stride length;Wide base of support;Ataxic (bil LEs locked in extension ) Gait velocity: very decr Gait velocity interpretation: Below normal speed for age/gender General Gait Details: pt unsteady with gt; keeping wide BOS and knees extended; mod (A) to maintain balance; pt with ataxic like/uncoordinated movements; given RW for support to transfer with nurses     ADL:    Cognition: Cognition Overall Cognitive Status: Within Functional Limits for tasks assessed Orientation Level: Oriented X4 Cognition Arousal/Alertness: Awake/alert Behavior During Therapy: WFL for tasks assessed/performed Overall Cognitive Status: Within Functional Limits for tasks assessed Memory: Decreased recall of precautions  Blood pressure 110/64, pulse 70, temperature 98.2 F (36.8 C), temperature source Oral, resp. rate 16, height 5\' 7"  (1.702 m), weight 67.087 kg (147 lb 14.4 oz), SpO2 100.00%. Physical Exam  Nursing note and vitals reviewed. Constitutional: He is oriented to person, place, and time. He appears well-developed and well-nourished.  HENT:  Head: Normocephalic and atraumatic.  Eyes: Pupils  are equal, round, and reactive to light.  Neck: Normal range of motion. Neck supple.  Cardiovascular: Normal rate and regular rhythm.   Respiratory: Effort normal and breath sounds normal. No respiratory distress. He has no wheezes.  GI: Bowel sounds are normal. He exhibits no distension. There is no tenderness.  Musculoskeletal: He exhibits no edema and no tenderness.  Surgical site tender.  Neurological: He is alert and oriented to person, place, and time.  UES 5/5. No sensory deficits. LE's: HF 4-, KE 4+, ADF/APF 4+. Senses light touch and pain. Mild pain with proximal leg movement.   Skin: Skin is warm and dry.  Psychiatric: He has a normal mood and affect. His behavior is normal. Judgment and thought content normal.    Results for orders placed during the hospital encounter of 03/06/14 (from the past 24 hour(s))  COMPREHENSIVE METABOLIC PANEL     Status: Abnormal   Collection Time    03/08/14  4:30 AM      Result Value Ref Range   Sodium 137  137 - 147 mEq/L   Potassium 4.3  3.7 - 5.3 mEq/L   Chloride 100  96 - 112 mEq/L   CO2 26  19 - 32 mEq/L   Glucose, Bld 123 (*) 70 - 99 mg/dL   BUN 16  6 - 23 mg/dL   Creatinine, Ser 0.76  0.50 - 1.35 mg/dL  Calcium 8.8  8.4 - 10.5 mg/dL   Total Protein 6.9  6.0 - 8.3 g/dL   Albumin 2.7 (*) 3.5 - 5.2 g/dL   AST 29  0 - 37 U/L   ALT 65 (*) 0 - 53 U/L   Alkaline Phosphatase 180 (*) 39 - 117 U/L   Total Bilirubin <0.2 (*) 0.3 - 1.2 mg/dL   GFR calc non Af Amer >90  >90 mL/min   GFR calc Af Amer >90  >90 mL/min   Anion gap 11  5 - 15  CBC WITH DIFFERENTIAL     Status: Abnormal   Collection Time    03/08/14  4:30 AM      Result Value Ref Range   WBC 18.8 (*) 4.0 - 10.5 K/uL   RBC 3.93 (*) 4.22 - 5.81 MIL/uL   Hemoglobin 9.7 (*) 13.0 - 17.0 g/dL   HCT 30.4 (*) 39.0 - 52.0 %   MCV 77.4 (*) 78.0 - 100.0 fL   MCH 24.7 (*) 26.0 - 34.0 pg   MCHC 31.9  30.0 - 36.0 g/dL   RDW 14.6  11.5 - 15.5 %   Platelets 335  150 - 400 K/uL    Neutrophils Relative % 90 (*) 43 - 77 %   Neutro Abs 16.9 (*) 1.7 - 7.7 K/uL   Lymphocytes Relative 6 (*) 12 - 46 %   Lymphs Abs 1.1  0.7 - 4.0 K/uL   Monocytes Relative 4  3 - 12 %   Monocytes Absolute 0.8  0.1 - 1.0 K/uL   Eosinophils Relative 0  0 - 5 %   Eosinophils Absolute 0.0  0.0 - 0.7 K/uL   Basophils Relative 0  0 - 1 %   Basophils Absolute 0.0  0.0 - 0.1 K/uL   Mr Thoracic Spine W Wo Contrast  03/07/2014   ADDENDUM REPORT: 03/07/2014 09:28  ADDENDUM: Critical Value/emergent results were called by telephone at the time of interpretation on 03/07/2014 at 0923 hr to Dr. Charlies Silvers who verbally acknowledged these results.   Electronically Signed   By: Lars Pinks M.D.   On: 03/07/2014 09:28   03/07/2014   CLINICAL DATA:  48 year old male with progressive Back pain and paraplegia. 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  EXAM: MRI THORACIC SPINE WITHOUT AND WITH CONTRAST  TECHNIQUE: Multiplanar and multiecho pulse sequences of the thoracic spine were obtained without and with intravenous contrast.  CONTRAST:  40mL MULTIHANCE GADOBENATE DIMEGLUMINE 529 MG/ML IV SOLN  COMPARISON:  Chest CT 03/06/2014 and earlier.  FINDINGS: Bulky enhancing tumor throughout the upper thoracic spine and paraspinal soft tissues. Confluent disease affecting the T1, T2, and T3 posterior elements and medial ribs, greater on the left. Nearly circumferential epidural tumor results, with mild cord compression at T2 (series 7, image 7), and T3 (series 7, image 11). Obliteration of the left T1 and T2 neural foramina.  Confluent tumor throughout the T4 vertebra. Ventral epidural extension on the right without cord compression at this time.  Pathologic compression fracture of T5, with bulky pedicle and posterior element tumor on the left resulting in mild cord compression (series 7, image 19). The left T5 neural foramen is obliterated.  Vertebral and epidural tumor at T6 without cord compression at this time.  Bulky and  expansile right T7 rib mass (series 7, image 25). Similar expansile left T10 rib mass. Lower thoracic vertebral body metastases. Without additional epidural tumor  No abnormal intradural enhancement identified.  Stable lung  parenchyma and visualized abdominal viscera from recent CTs.  IMPRESSION: 1. Confluent metastatic disease to the upper thoracic spine. Epidural and paraspinal tumor resulting in mild cord compression at the T2, T3, and T5 spinal cord levels. No cord edema identified. 2. Tumor filling the left T1, T2, and T5 neural foramina. 3. Epidural tumor without cord compression also at the T4 and T6 levels. 4. Metastatic destruction of several posterior ribs.  Electronically Signed: By: Lars Pinks M.D. On: 03/07/2014 08:51   Dg Thoracic Spine 1 View  03/07/2014   CLINICAL DATA:  Thoracic laminectomy for tumor removal  EXAM: OPERATIVE THORACIC SPINE 1 VIEW(S)  COMPARISON:  Thoracic MRI 03/07/2014  FINDINGS: Tip of endotracheal tube projects 3.2 cm above carina.  Metallic marker projects over C7 and T1 at the midline.  Thoracic spine inadequately visualized due to technique.  IMPRESSION: Limited exam showing no definite acute thoracic spine abnormalities.   Electronically Signed   By: Lavonia Dana M.D.   On: 03/07/2014 18:30    Assessment/Plan: Diagnosis: metastatic ca to the Thoracic spine s/p decompression 1. Does the need for close, 24 hr/day medical supervision in concert with the patient's rehab needs make it unreasonable for this patient to be served in a less intensive setting? Yes 2. Co-Morbidities requiring supervision/potential complications: htn, oncological management 3. Due to bladder management, bowel management, safety, skin/wound care, disease management, medication administration, pain management and patient education, does the patient require 24 hr/day rehab nursing? Yes 4. Does the patient require coordinated care of a physician, rehab nurse, PT (1-2 hrs/day, 5 days/week) and OT  (1-2 hrs/day, 5 days/week) to address physical and functional deficits in the context of the above medical diagnosis(es)? Yes Addressing deficits in the following areas: balance, endurance, locomotion, strength, transferring, bowel/bladder control, bathing, dressing, feeding, grooming, toileting and psychosocial support 5. Can the patient actively participate in an intensive therapy program of at least 3 hrs of therapy per day at least 5 days per week? Yes 6. The potential for patient to make measurable gains while on inpatient rehab is excellent 7. Anticipated functional outcomes upon discharge from inpatient rehab are modified independent  with PT, modified independent with OT, n/a with SLP. 8. Estimated rehab length of stay to reach the above functional goals is: ?5-7 days 9. Does the patient have adequate social supports to accommodate these discharge functional goals? Potentially 10. Anticipated D/C setting: Home   11. Anticipated post D/C treatments: HH therapy, Outpatient therapy and Home excercise program 12. Overall Rehab/Functional Prognosis: excellent  RECOMMENDATIONS: This patient's condition is appropriate for continued rehabilitative care in the following setting: CIR vs SNF Patient has agreed to participate in recommended program. No and Potentially Note that insurance prior authorization may be required for reimbursement for recommended care.  Comment: The patient tells me that his family prefers that he's closer to home [facility]). I explained the differences between our program and SNF.  He seems to be progressing neurologically. Would like to see how he does with therapy here this am. If he's doing really well, it might be sufficient just to go to SNF.  Rehab Admissions Coordinator to follow up.  Thanks,  Meredith Staggers, MD, Mellody Drown     03/08/2014

## 2014-03-08 NOTE — Consult Note (Signed)
[place]  Telephone:(336) [place] NOTE  Luis [name]                                MR#: [id number]  DOB: 02-27-1966                       CSN#: [id number]  Referring MD: Dr. Sheliah Plane [facility]   Primary MD: Dr. Virgel Bouquet [facility])  Reason for Consult: Metastatic Bone Lesions   [name]:[name] Stapel is a 48 y.o. male originally from [place], admitted with 4 month history of diffuse neck and shoulder pain,not improved with antiinflammatories, flexeril and tramadol given by his PCP 2 weeks ago. It appears at that time he had x rays performed which were negative, but no records are available for review. Status was complicated by bilateral lower extremity weakness, unable to walk since 8/21, nausea and vomiting. On 8/22, he noted inability to void. He had no unintentional weight loss.He presented to [facility] on 8/23.  Multiple lytic and bony lesions on MRI L spine were see, with L2 pathologic compression fracture, destructive lesion on the 10th rib with extraosseous extension of tumor.  Had follow up chest CT remarkable for  a 2.5 x 2.1 LUL nodule concerning for possible Bronchogenic carcinoma, innumerable smaller pulmonary nodules scattered throughout the lungs (although more pronounced on the upper lung) without definite hilar or mediastinal lymphadenopathy and a large right rib lesion, among other osseous mets, including a T2 lesion with soft tissue extension encroaching the spinal canal. Oncology was also consulted at that facility, Dr. Ma [name].    He was to undergo ar CT guided biopsy at [facility]. However this was stopped given neurological complaints. He received Decadron IV and he was transferred to [facility] for further evaluation and management of possible cord compression. At the time, he had SPEP and UPEP drawn, but the results are unavailable due to transfer here. Calcium was normal. LDH upper normal at 213.  Of note tumor markers drawn on  8/25 are as follows:  AFP 1.9, CA 19.9 1.1, CEA 3.2, PSA 0.76 . AlkPhos was elevated at 258, now 123.   On arrival, he underwent  MRI of the T spine, showing confluent metastatic disease to the upper thoracic spine with some destruction in the posterior ribs. He had emergent surgical decompression via laminectomy at the T1-T2 level with biopsies taken for formal diagnosis (Dr. Ellene Route). He is now able to move his extremities(4/10), ambulate with assistance, and sensation has increased. His pain is better controlled. He is still unable to urinate, but his constipation is resolved.  Radiation Oncology to see patient today for treatment options. We were requested to see this patient with recommendations.        PMH: Hypertension  Surgeries: T1-T2 laminectomy and decompression of spinal cord, resection of malignant metastatic tumor, frozen section biopsy, Dr. Ellene Route 03/07/14  Allergies: No Known Allergies  Medications:  Scheduled Meds: . dexamethasone  4 mg Intravenous 4 times per day  . hydrochlorothiazide  25 mg Oral Daily  . lisinopril  40 mg Oral Daily  . metoprolol succinate  50 mg Oral QHS  . sodium chloride  3 mL Intravenous Q12H  . tuberculin  5 Units Intradermal Once   Continuous Infusions: . sodium chloride    . sodium chloride 75 mL/hr at 03/08/14 0751   PRN Meds:.acetaminophen, acetaminophen, alum & mag  hydroxide-simeth, HYDROcodone-acetaminophen, HYDROmorphone (DILAUDID) injection, HYDROmorphone (DILAUDID) injection, menthol-cetylpyridinium, methocarbamol (ROBAXIN) IV, methocarbamol, morphine injection, ondansetron (ZOFRAN) IV, oxyCODONE-acetaminophen, phenol, promethazine, sodium chloride  ROS: Constitutional: Denies fevers, chills or abnormal night sweats Eyes: Denies blurriness of vision, double vision or watery eyes Ears, nose, mouth, throat, and face: Denies mucositis or sore throat Respiratory: Denies cough, dyspnea or wheezes Cardiovascular: Denies palpitation, chest  discomfort or lower extremity swelling Gastrointestinal: He had  nausea and vomiting on presentation, denies heartburn. Constipation due to compression. No hematochezia. Skin: Denies abnormal skin rashes Lymphatics: Denies new lymphadenopathy or easy bruising Neurological Had bilateral lower extremity weakness, inability to urinate, constipation due to compression, as per HPI, but is able to move legs after surgery (4/10), sensation is 7/10, still unable to urinate. Constipation resolved. Musculoskeletal: positive for myalgia and bone pain as per HPI, especially in pelvis, shoulder and back Behavioral/Psych: Mood is stable, no new changes   All other systems otherwise mentioned in HPI were reviewed with the patient and are negative.   Family History:    Remarkable for mother died with colon cancer. Father has cardiac disease. 4 brothers and 1 sister, all with HTN.  Social History:  reports that he has never smoked. He does not have any smokeless tobacco history on file. He reports that he drinks about .5 ounces of alcohol per week. His drug history is not on file. moved from [place] 10 years ago Married. 2 children Works as a Architectural technologist at [employer].  Physical Exam    ECOG PERFORMANCE STATUS:  3  Symptomatic, >50% in bed, but not bedbound (Capable of only limited self-care, confined to bed or chair 50% or more of waking hours)     Filed Vitals:   03/08/14 1014  BP: 110/64  Pulse: 70  Temp: 98.2 F (36.8 C)  Resp: 16   Filed Weights   03/06/14 2208 03/07/14 0500 03/08/14 0500  Weight: 142 lb 8 oz (64.638 kg) 143 lb 9.6 oz (65.137 kg) 147 lb 14.4 oz (67.087 kg)    GENERAL:alert, no distress and comfortable SKIN: skin color, texture, turgor are normal, no rashes or significant lesions EYES: normal, conjunctiva are pink and non-injected, sclera clear OROPHARYNX:no exudate, no erythema and lips, buccal mucosa, and tongue normal  NECK: supple, thyroid normal size, non-tender,  without nodularity LYMPH:  no palpable lymphadenopathy in the cervical, axillary or inguinal area LUNGS: clear to auscultation and percussion with normal breathing effort HEART: regular rate & rhythm and no murmurs and no lower extremity edema ABDOMEN:abdomen soft, non-tender and normal bowel sounds Musculoskeletal:no cyanosis of digits and no clubbing  PSYCH: alert & oriented x 3 with fluent speech NEURO: strength 3/5 bilaterally in lower extremities, sensation 7/10. No other focal abnormalities.   Labs:  CBC   Recent Labs Lab 03/07/14 0038 03/07/14 0640 03/08/14 0430  WBC 9.4 8.8 18.8*  HGB 12.7* 12.6* 9.7*  HCT 39.2 39.1 30.4*  PLT 374 413* 335  MCV 77.6* 76.4* 77.4*  MCH 25.1* 24.6* 24.7*  MCHC 32.4 32.2 31.9  RDW 14.7 14.5 14.6  LYMPHSABS  --  0.9 1.1  MONOABS  --  0.1 0.8  EOSABS  --  0.0 0.0  BASOSABS  --  0.0 0.0     CMP    Recent Labs Lab 03/07/14 0038 03/07/14 0640 03/08/14 0430  NA  --  135* 137  K  --  4.6 4.3  CL  --  98 100  CO2  --  25 26  GLUCOSE  --  136* 123*  BUN  --  15 16  CREATININE 0.82 0.76 0.76  CALCIUM  --  9.7 8.8  AST  --  53* 29  ALT  --  87* 65*  ALKPHOS  --  258* 180*  BILITOT  --  0.3 <0.2*      Imaging Studies:  Mr Thoracic Spine W Wo Contrast  03/07/2014   COMPARISON:  Chest CT 03/06/2014 and earlier.  FINDINGS: Bulky enhancing tumor throughout the upper thoracic spine and paraspinal soft tissues. Confluent disease affecting the T1, T2, and T3 posterior elements and medial ribs, greater on the left. Nearly circumferential epidural tumor results, with mild cord compression at T2 (series 7, image 7), and T3 (series 7, image 11). Obliteration of the left T1 and T2 neural foramina.  Confluent tumor throughout the T4 vertebra. Ventral epidural extension on the right without cord compression at this time.  Pathologic compression fracture of T5, with bulky pedicle and posterior element tumor on the left resulting in mild cord  compression (series 7, image 19). The left T5 neural foramen is obliterated.  Vertebral and epidural tumor at T6 without cord compression at this time.  Bulky and expansile right T7 rib mass (series 7, image 25). Similar expansile left T10 rib mass. Lower thoracic vertebral body metastases. Without additional epidural tumor  No abnormal intradural enhancement identified.  Stable lung parenchyma and visualized abdominal viscera from recent CTs.  IMPRESSION: 1. Confluent metastatic disease to the upper thoracic spine. Epidural and paraspinal tumor resulting in mild cord compression at the T2, T3, and T5 spinal cord levels. No cord edema identified. 2. Tumor filling the left T1, T2, and T5 neural foramina. 3. Epidural tumor without cord compression also at the T4 and T6 levels. 4. Metastatic destruction of several posterior ribs.  Electronically Signed: By: Lars Pinks M.D. On: 03/07/2014 08:51   Dg Thoracic Spine 1 View  03/07/2014   CLINICAL DATA:  Thoracic laminectomy for tumor removal  EXAM: OPERATIVE THORACIC SPINE 1 VIEW(S)  COMPARISON:  Thoracic MRI 03/07/2014  FINDINGS: Tip of endotracheal tube projects 3.2 cm above carina.  Metallic marker projects over C7 and T1 at the midline.  Thoracic spine inadequately visualized due to technique.  IMPRESSION: Limited exam showing no definite acute thoracic spine abnormalities.   Electronically Signed   By: Lavonia Dana M.D.   On: 03/07/2014 18:30    A/P: 48 y.o. male with   Metastatic Bone Lesions: Cord Compression   MRI demonstrated that he has expansive cancer involving only T1-T2 but also T3-T4 and T5.   MRI L spine remarkable for L2 pathologic compression fracture, destructive lesion on the 10th rib with extraosseous extension of tumor.   CT of the chest shows a 2.5 x 2.1 LUL nodule concerning for possible Bronchogenic carcinoma, innumerable smaller pulmonary nodules scattered throughout the  lungs (although more pronounced on the upper lung) without  definite    hilar or mediastinal lymphadenopathy   He is S/P decompression at T1 and T2. Biopsies were obtained with Pathology results pending. Further studies to follow once biopsy results obtained    He will need to complete staging with CT of abdomen and pelvis and possible MRI brain due to extensive metastatic disease   Continue Decadron IV   Obtain SPEP/UPEP from [place] to see   Patient is interested in following up in [facility] for treatment once discharged, due to geographical convenience.  Urinary Retention Secondary to malignancy/cord compression On  urinary foley  Anemia Secondary to malignancy, malnutrition, surgical procedure and dilution Could benefit from screening colonoscopy as outpatient due to family history of colon cancer  Leukocytosis Secondary to Decadron, pain and recent surgery/inflammation Monitor.  Possible prior exposure to TB PPD ordered out of concern for possible TB (pt originally from [place])  Continue OT/PT  DVT prophylaxis On Lovenox  Full Code No transfusion indicated at this time    **Disclaimer: This note was dictated with voice recognition software. Similar sounding words can inadvertently be transcribed and this note may contain transcription errors which may not have been corrected upon publication of note.Sharene Butters E, PA-C 03/08/2014 10:30 AM  ADDENDUM: Hematology/Oncology Attending: The patient is seen and examined today. I agree with the above note. This is a very pleasant 48 years old Asian male originally from [place] runs a [employer] in [place]. The patient mentions that since February of 2015 he has been complaining of neck and left shoulder pain. He was seen by his primary care physician several times and was treated with anti-inflammatory as well as muscle relaxants and pain medication. His condition was not improving and the patient started feeling weakness and fatigue in the  lower extremities. He presented to the emergency Department at [facility] on 03/05/2014. During his evaluation CT scan of the abdomen was performed initially and it showed metastatic disease to the visible the spine with L2 pathologic compression fracture. There was also a destructive lesion of the left 10th rib with extraosseous extension of tumor. MRI of the lumbosacral spines was performed on 03/05/2014 and it showed diffuse osseous metastases involving the lumbar spine and pelvis, including lesions at T12 and T11. There was also metastatic lesion involving the entirety of the L2 vertebral body associated with pathologic compression fracture with approximately 50% of the anterior height loss. Metastatic tumor also present within the spinous process of L1. Tumor present within the L4 spinous process. 1.2 cm lesion present within the right lamina/spinous process of L5. 7 mm lesion present within the S3 segment. There is a 1.1 cm within the left sacral ala. 1.9 x 2.3 cm lesion present within the right sacral ala. There is no evidence of significant spinal canal stenosis or CORD compression within the lumbar spine.  CT scan of the chest was performed on 03/03/2014 and it showed 2.5 x 2.1 CM left upper lobe nodule highly suspicious for primary bronchogenic carcinoma. There was no definite mediastinal or hilar lymphadenopathy but there was multiple lytic lesions in the bone compatible with metastatic disease to the skeleton. There was also innumerable smaller pulmonary nodules scattered throughout the lungs bilaterally predominantly in the upper lung and peribronchial vascular in distribution. The scan also showed multiple skeletal lesions including a lesion and T2 with any pathologic fractures and soft tissue encroachment upon the central spinal canal.  The patient was transferred to [facility] and MRI of the thoracic spine on 03/07/2014 and it showed metastatic disease to the upper thoracic spine.  There was epidural and paraspinal tumor resulting in mild cord compression at the T2, T3 and T5 spinal cord levels. There was no cord edema identified. There is tumor filling the left T1 and T2 as well as T5 neural foramina. There was epidural tumor without cord compression also at the T4 and T6 levels. The patient also had metastatic destruction of several posterior ribs. The patient underwent T1-T2 laminectomy and decompression of spinal cord, resection of malignant metastatic tumor under the care of Dr.  Elsner on 03/07/2014. The final pathology is still pending. The patient is feeling much better today and he still has good strength and mobility in his lower extremities. His pain in the neck and shoulder area has improved. He denied having any significant chest pain, shortness breath, cough or hemoptysis. He denied having any significant weight loss or night sweats. The patient denied having any bowel or urinary incontinence. His mother had colon cancer. 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 He has no history of alcohol or drug abuse. He is married and has 2 children age 67 and 1&1/2 years.  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 The final pathology is still pending. He presented with cord compression at the T1-T2 A. status post laminectomy and decompression of the spinal cord with resection of the malignant metastatic tumor. I would wait for the final pathology and it is consistent with non-small cell lung cancer, I would request the tissue block to be sent to [facility] one for molecular biomarker testing as the patient is likely to have a target mutation for treatment like EGFR. I would recommend consultation with radiation oncology for evaluation and palliative radiotherapy to the metastatic bone  lesion and the resection area after healing. I will order MRI of the brain to rule out brain metastasis. I would also consider the patient for a PET scan on outpatient basis after discharge. I will arrange for the patient a followup appointment with me at the [facility] after discharge from the hospital for more detailed discussion of his treatment options based on the final pathology and the staging workup. I gave the patient the time to ask questions and I answered them completely to his satisfaction. Thank you so much for allowing me to participate in the care of Mr. Kauth. I will continue to follow the patient with you and assist in his management an as-needed basis.

## 2014-03-08 NOTE — Progress Notes (Signed)
Clinical Social Work Department BRIEF PSYCHOSOCIAL ASSESSMENT 03/08/2014  Patient:  Luis [name],Luis [name]     Account Number:  [id number]     Admit date:  03/06/2014  Clinical Social Worker:  Adair Laundry  Date/Time:  03/08/2014 03:00 PM  Referred by:  Physician  Date Referred:  03/08/2014 Referred for  SNF Placement   Other Referral:   Interview type:  Patient Other interview type:    PSYCHOSOCIAL DATA Living Status:  WIFE Admitted from facility:   Level of care:   Primary support name:   Primary support relationship to patient:  SPOUSE Degree of support available:   Pt has good family support    CURRENT CONCERNS Current Concerns  Post-Acute Placement   Other Concerns:    SOCIAL WORK ASSESSMENT / PLAN CSW aware that recommendation is for CIR. CSW visited pt room to assess for backup SNF. CSW introduced self and spoke with pt about recomendation. Pt informed CSW he is aware of recommendation and is hoping he can dc to CIR. CSW explained that CIR will be preference but pt should begin to think of back up option. CSW explained SNF vs home with HH.  After discussing, rehab options of both pt decided he would not be able to manage at home and would like to have SNF backup. CSW explained referral process and pt is agreeable to referral being sent to all of [facility]. Pt prefers a [place] facility but has no specific facility preference. Pt thankful for CSW visit and informed CSW he would speak with his family about our discussion.   Assessment/plan status:  Psychosocial Support/Ongoing Assessment of Needs Other assessment/ plan:   Information/referral to community resources:   SNF list to be provided with bed offers if needed    PATIENT'S/FAMILY'S RESPONSE TO PLAN OF CARE: Pt very pleasant and coopeartive. Pt has strong preference for CIR but agreeable to SNF backup.       [place], [place]

## 2014-03-08 NOTE — Progress Notes (Signed)
Clinical Social Work Department CLINICAL SOCIAL WORK PLACEMENT NOTE 03/08/2014  Patient:  Luis [name], Luis [name]  Account Number:  [id number] [place] date:  03/06/2014  Clinical Social Worker:  Berton Mount, Latanya Presser  Date/time:  03/08/2014 03:45 PM  Clinical Social Work is seeking post-discharge placement for this patient at the following level of care:   [place]   (*CSW will update this form in Epic as items are completed)   03/08/2014  Patient/family provided with [place] Department of Clinical Social Work's list of facilities offering this level of care within the geographic area requested by the patient (or if unable, by the patient's family).  03/08/2014  Patient/family informed of their freedom to choose among providers that offer the needed level of care, that participate in Medicare, Medicaid or managed care program needed by the patient, have an available bed and are willing to accept the patient.  03/08/2014  Patient/family informed of [facility]' ownership interest in [facility], as well as of the fact that they are under no obligation to receive care at this facility.  PASARR submitted to EDS on 03/08/2014 PASARR number received on 03/08/2014  FL2 transmitted to all facilities in geographic area requested by pt/family on  03/08/2014 FL2 transmitted to all facilities within larger geographic area on   Patient informed that his/her managed care company has contracts with or will negotiate with  certain facilities, including the following:     Patient/family informed of bed offers received:   Patient chooses bed at  Physician recommends and patient chooses bed at    Patient to be transferred to  on   Patient to be transferred to facility by  Patient and family notified of transfer on  Name of family member notified:    The following physician request were entered in Epic: Physician Request  Please sign FL2.    Additional CommentsBerton Mount,  [place]

## 2014-03-08 NOTE — Progress Notes (Signed)
Patient ID: Luis [name]  male  NHA:[id number]    DOB: [birth year]    DOA: 03/06/2014  PCP: Pcp Not In System  Assessment/Plan: Principal Problem:  Metastatic Spinal cord tumor: Patient presented with pain across base of neck, shoulders, bilateral lower extremity weakness. MRI of the thoracic spine showed bony destructive lesions in the vertebraeT1-T2 T3-T4 and T5. Worst area of spinal cord compression is at T1-T2. Metastatic destruction of several posterior ribs. - Neurosurgery was consulted and patient underwent T1-T2 laminectomy and decompression of spinal cord, resection of malignant metastatic tumor and biopsy. Biopsy results are still pending - I have consulted oncology, Dr. Julien Nordmann and urgent consult radiation oncology for further recommendations   Active Problems:   Weakness - PT, OT evaluation done, recommending CIR    Essential hypertension, benign - Currently stable, continue Toprol, lisinopril, HCTZ  DVT Prophylaxis:  Code Status:  Family Communication:Discussed with patient in detail   Disposition:  Consultants:  Neurosurgery    oncology  Radiation oncology  Procedures: T1-T2 laminectomy and decompression of spinal cord, resection of malignant metastatic tumor and biopsy.   Antibiotics:  none    Subjective: Patient seen and examined, feeling that strength is improving no significant pain   Objective: Weight change: 2.449 kg (5 lb 6.4 oz)  Intake/Output Summary (Last 24 hours) at 03/08/14 1142 Last data filed at 03/08/14 0814  Gross per 24 hour  Intake   1730 ml  Output   4095 ml  Net  -2365 ml   Blood pressure 110/64, pulse 70, temperature 98.2 F (36.8 C), temperature source Oral, resp. rate 16, height 5\' 7"  (1.702 m), weight 67.087 kg (147 lb 14.4 oz), SpO2 100.00%.  Physical Exam: General: Alert and awake, oriented x3, not in any acute distress. CVS: S1-S2 clear, no murmur rubs or gallops Chest: clear to auscultation bilaterally, no wheezing,  rales or rhonchi Abdomen: soft nontender, nondistended, normal bowel sounds  Extremities: no cyanosis, clubbing or edema noted bilaterally   Lab Results: Basic Metabolic Panel:  Recent Labs Lab 03/07/14 0640 03/08/14 0430  NA 135* 137  K 4.6 4.3  CL 98 100  CO2 25 26  GLUCOSE 136* 123*  BUN 15 16  CREATININE 0.76 0.76  CALCIUM 9.7 8.8   Liver Function Tests:  Recent Labs Lab 03/07/14 0640 03/08/14 0430  AST 53* 29  ALT 87* 65*  ALKPHOS 258* 180*  BILITOT 0.3 <0.2*  PROT 8.5* 6.9  ALBUMIN 2.8* 2.7*   No results found for this basename: LIPASE, AMYLASE,  in the last 168 hours No results found for this basename: AMMONIA,  in the last 168 hours CBC:  Recent Labs Lab 03/07/14 0640 03/08/14 0430  WBC 8.8 18.8*  NEUTROABS 7.8* 16.9*  HGB 12.6* 9.7*  HCT 39.1 30.4*  MCV 76.4* 77.4*  PLT 413* 335   Cardiac Enzymes: No results found for this basename: CKTOTAL, CKMB, CKMBINDEX, TROPONINI,  in the last 168 hours BNP: No components found with this basename: POCBNP,  CBG: No results found for this basename: GLUCAP,  in the last 168 hours   Micro Results: No results found for this or any previous visit (from the past 240 hour(s)).  Studies/Results: Mr Thoracic Spine Moise Boring Contrast  03/07/2014   ADDENDUM REPORT: 03/07/2014 09:28  ADDENDUM: Critical Value/emergent results were called by telephone at the time of interpretation on 03/07/2014 at 0923 hr to Dr. Charlies Silvers who verbally acknowledged these results.   Electronically Signed   By: Lezlie Octave.D.  On: 03/07/2014 09:28   03/07/2014   CLINICAL DATA:  48 year old male with progressive Back pain and paraplegia. 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  EXAM: MRI THORACIC SPINE WITHOUT AND WITH CONTRAST  TECHNIQUE: Multiplanar and multiecho pulse sequences of the thoracic spine were obtained without and with intravenous contrast.  CONTRAST:  55mL MULTIHANCE GADOBENATE DIMEGLUMINE 529 MG/ML IV SOLN  COMPARISON:   Chest CT 03/06/2014 and earlier.  FINDINGS: Bulky enhancing tumor throughout the upper thoracic spine and paraspinal soft tissues. Confluent disease affecting the T1, T2, and T3 posterior elements and medial ribs, greater on the left. Nearly circumferential epidural tumor results, with mild cord compression at T2 (series 7, image 7), and T3 (series 7, image 11). Obliteration of the left T1 and T2 neural foramina.  Confluent tumor throughout the T4 vertebra. Ventral epidural extension on the right without cord compression at this time.  Pathologic compression fracture of T5, with bulky pedicle and posterior element tumor on the left resulting in mild cord compression (series 7, image 19). The left T5 neural foramen is obliterated.  Vertebral and epidural tumor at T6 without cord compression at this time.  Bulky and expansile right T7 rib mass (series 7, image 25). Similar expansile left T10 rib mass. Lower thoracic vertebral body metastases. Without additional epidural tumor  No abnormal intradural enhancement identified.  Stable lung parenchyma and visualized abdominal viscera from recent CTs.  IMPRESSION: 1. Confluent metastatic disease to the upper thoracic spine. Epidural and paraspinal tumor resulting in mild cord compression at the T2, T3, and T5 spinal cord levels. No cord edema identified. 2. Tumor filling the left T1, T2, and T5 neural foramina. 3. Epidural tumor without cord compression also at the T4 and T6 levels. 4. Metastatic destruction of several posterior ribs.  Electronically Signed: By: Lars Pinks M.D. On: 03/07/2014 08:51   Dg Thoracic Spine 1 View  03/07/2014   CLINICAL DATA:  Thoracic laminectomy for tumor removal  EXAM: OPERATIVE THORACIC SPINE 1 VIEW(S)  COMPARISON:  Thoracic MRI 03/07/2014  FINDINGS: Tip of endotracheal tube projects 3.2 cm above carina.  Metallic marker projects over C7 and T1 at the midline.  Thoracic spine inadequately visualized due to technique.  IMPRESSION: Limited  exam showing no definite acute thoracic spine abnormalities.   Electronically Signed   By: Lavonia Dana M.D.   On: 03/07/2014 18:30    Medications: Scheduled Meds: . dexamethasone  4 mg Intravenous 4 times per day  . hydrochlorothiazide  25 mg Oral Daily  . lisinopril  40 mg Oral Daily  . metoprolol succinate  50 mg Oral QHS  . sodium chloride  3 mL Intravenous Q12H  . tuberculin  5 Units Intradermal Once      LOS: 2 days   Abbygail Willhoite M.D. [facility] 03/08/2014, 11:42 AM Pager: [phone]  If 7PM-7AM, please contact night-coverage [email or web address] Password [email or web address]  **Disclaimer: This note was dictated with voice recognition software. Similar sounding words can inadvertently be transcribed and this note may contain transcription errors which may not have been corrected upon publication of note.**

## 2014-03-08 NOTE — Evaluation (Signed)
Occupational Therapy Evaluation Patient Details Name: Luis [name] MRN: [id number] DOB: [birth year] Today's Date: 03/08/2014    History of Present Illness 48 yo male underwent T1-2 laminectomy and decompression of spinal cord resectionof malignant metastatic tumor.    Clinical Impression   Patient is s/p T1-2 surgery resulting in functional limitations due to the deficits listed below (see OT problem list). PtA independent with ADLs. Patient will benefit from skilled OT acutely to increase independence and safety with ADLS to allow discharge CIR. Recommend a palliative consult to help address all follow up options. Family and patient could benefit from staff to translate. Pt does reports ability to read [place] handouts without difficulty.  Pt currently with decr understanding of cervical precautions with adls and balance deficits.      Follow Up Recommendations  CIR    Equipment Recommendations  None recommended by OT    Recommendations for Other Services Rehab consult;Other (comment) (Palliative)     Precautions / Restrictions Precautions Precautions: Cervical;Fall Precaution Comments: educated on cervical precautions Restrictions Weight Bearing Restrictions: No      Mobility Bed Mobility Overal bed mobility: Needs Assistance Bed Mobility: Sidelying to Sit;Supine to Sit;Rolling Rolling: Supervision Sidelying to sit: Min guard Supine to sit: Min guard     General bed mobility comments: cues for cervical precautions  Transfers Overall transfer level: Needs assistance Equipment used: Rolling walker (2 wheeled) Transfers: Sit to/from Stand Sit to Stand: Min guard              Balance                                            ADL Overall ADL's : Needs assistance/impaired     Grooming: Wash/dry hands;Min guard;Standing Grooming Details (indicate cue type and reason): pt alternating hands and unable to static stand with bil UE Upper Body  Bathing: Minimal assitance;Sitting   Lower Body Bathing: Minimal assistance;Sit to/from stand Lower Body Bathing Details (indicate cue type and reason): cues for cervical precautions         Toilet Transfer: Min guard;Ambulation;Regular Glass blower/designer Details (indicate cue type and reason): cues for safety and use of RW required         Functional mobility during ADLs: Min guard;Rolling walker General ADL Comments: pt with decr gait velocity and widen base of support     Vision                     Perception     Praxis      Pertinent Vitals/Pain Pain Assessment: Faces Faces Pain Scale: Hurts little more Pain Location: Rt lower back Pain Descriptors / Indicators: Constant Pain Intervention(s): Repositioned;Premedicated before session (medicated at 12 PM and reports pain decreasing)     Hand Dominance Right   Extremity/Trunk Assessment Upper Extremity Assessment Upper Extremity Assessment: Overall WFL for tasks assessed   Lower Extremity Assessment Lower Extremity Assessment: Generalized weakness   Cervical / Trunk Assessment Cervical / Trunk Assessment: Normal   Communication Communication Communication: No difficulties   Cognition Arousal/Alertness: Awake/alert Behavior During Therapy: WFL for tasks assessed/performed Overall Cognitive Status: Within Functional Limits for tasks assessed                     General Comments       Exercises       Shoulder Instructions  Home Living Family/patient expects to be discharged to:: Private residence Living Arrangements: Spouse/significant other;Children Available Help at Discharge: Family;Available 24 hours/day Type of Home: Apartment Home Access: Stairs to enter Entrance Stairs-Number of Steps: 1 Entrance Stairs-Rails: None Home Layout: One level         Biochemist, clinical: Standard     Home Equipment: None   Additional Comments: pt works a Architectural technologist at [employer]       Prior Functioning/Environment Level of Independence: Independent             OT Diagnosis: Generalized weakness;Acute pain   OT Problem List: Decreased strength;Decreased activity tolerance;Impaired balance (sitting and/or standing);Decreased safety awareness;Decreased knowledge of use of DME or AE;Decreased knowledge of precautions;Pain   OT Treatment/Interventions: Self-care/ADL training;Therapeutic exercise;Therapeutic activities;Patient/family education;Balance training    OT Goals(Current goals can be found in the care plan section) Acute Rehab OT Goals Patient Stated Goal: to get home with my kids  OT Goal Formulation: With patient Time For Goal Achievement: 03/22/14 Potential to Achieve Goals: Good  OT Frequency: Min 2X/week   Barriers to D/C:            Co-evaluation              End of Session Equipment Utilized During Treatment: Gait belt;Rolling walker Nurse Communication: Mobility status;Precautions  Activity Tolerance: Patient limited by pain Patient left: in bed;with call bell/phone within reach   Time: 1432-1443 OT Time Calculation (min): 11 min Charges:  OT General Charges $OT Visit: 1 Procedure OT Evaluation $Initial OT Evaluation Tier I: 1 Procedure OT Treatments $Self Care/Home Management : 8-22 mins G-Codes:    Peri Maris 2014/03/31, 2:49 PM Pager: [phone]

## 2014-03-08 NOTE — Progress Notes (Signed)
Rehab Admissions Coordinator Note:  Patient was screened by Cleatrice Burke for appropriateness for an Inpatient Acute Rehab Consult per PT recommendation.  At this time, we are recommending Inpatient Rehab consult. I will contact Dr. Tana Coast for order.  Cleatrice Burke 03/08/2014, 11:36 AM  I can be reached at [phone].

## 2014-03-08 NOTE — Evaluation (Signed)
Physical Therapy Evaluation Patient Details Name: Luis [name] MRN: [id number] DOB: [birth year] Today's Date: 03/08/2014   History of Present Illness  48 yo male underwent T1-2 laminectomy and decompression of spinal cord resectionof malignant metastatic tumor.   Clinical Impression  Patient is s/p above surgery resulting in the deficits listed below (see PT Problem List). Patient will benefit from skilled PT to increase their independence and safety with mobility (while adhering to their precautions) to allow discharge to next appropriate venue. PTA pt independent and working full-time at [employer]. Pt is very motivated to return to PLOF and return home with his family. Will recommend CIR at this time per patient's goals for therapy to increase independence prior to D/C with family.      Follow Up Recommendations CIR    Equipment Recommendations  Rolling walker with 5" wheels    Recommendations for Other Services OT consult;Rehab consult     Precautions / Restrictions Precautions Precautions: Cervical;Fall Precaution Comments: educated on cervical precautions Restrictions Weight Bearing Restrictions: No      Mobility  Bed Mobility Overal bed mobility: Needs Assistance Bed Mobility: Rolling;Sidelying to Sit Rolling: Supervision Sidelying to sit: Min guard       General bed mobility comments: cues for log rolling technique; min guard to facilitate sidelying to sit position   Transfers Overall transfer level: Needs assistance Equipment used: 1 person hand held assist Transfers: Sit to/from Stand Sit to Stand: Min assist         General transfer comment: (A) to maintain balance with sit to stand; reaching for UE support; pt with shaky; unsteady LEs due to generalized weakness   Ambulation/Gait Ambulation/Gait assistance: Mod assist Ambulation Distance (Feet): 10 Feet Assistive device: 1 person hand held assist (with IV support in opposite UE) Gait Pattern/deviations:  Step-to pattern;Decreased stride length;Wide base of support;Ataxic (bil LEs locked in extension ) Gait velocity: very decr Gait velocity interpretation: Below normal speed for age/gender General Gait Details: pt unsteady with gt; keeping wide BOS and knees extended; mod (A) to maintain balance; pt with ataxic like/uncoordinated movements; given RW for support to transfer with nurses   Stairs            Wheelchair Mobility    Modified Rankin (Stroke Patients Only)       Balance Overall balance assessment: Needs assistance Sitting-balance support: Feet supported;No upper extremity supported Sitting balance-Leahy Scale: Fair Sitting balance - Comments: sat EOB ~5 min; denied dizziness   Standing balance support: During functional activity;Bilateral upper extremity supported Standing balance-Leahy Scale: Poor Standing balance comment: required UE support at all times; unsteady in standing                              Pertinent Vitals/Pain Pain Assessment: 0-10 Pain Score: 3  Pain Location: surgical site Pain Descriptors / Indicators: Aching;Operative site guarding Pain Intervention(s): Limited activity within patient's tolerance;Repositioned;Patient requesting pain meds-RN notified    Home Living Family/patient expects to be discharged to:: Private residence Living Arrangements: Spouse/significant other;Children Available Help at Discharge: Family;Available 24 hours/day Type of Home: Apartment Home Access: Stairs to enter Entrance Stairs-Rails: None Entrance Stairs-Number of Steps: 1 (threshold step) Home Layout: One level Home Equipment: None Additional Comments: pt works a Architectural technologist at [employer] Level of Independence: Independent               Journalist, newspaper  Extremity/Trunk Assessment   Upper Extremity Assessment: Defer to OT evaluation           Lower Extremity Assessment: Generalized weakness (sensation  WFL; bil hips 3-/5)      Cervical / Trunk Assessment: Normal  Communication   Communication: No difficulties  Cognition Arousal/Alertness: Awake/alert Behavior During Therapy: WFL for tasks assessed/performed Overall Cognitive Status: Within Functional Limits for tasks assessed       Memory: Decreased recall of precautions              General Comments General comments (skin integrity, edema, etc.): discussed rehab goals and D/C disposition    Exercises General Exercises - Lower Extremity Ankle Circles/Pumps: AROM;Strengthening;Both;10 reps;Supine      Assessment/Plan    PT Assessment Patient needs continued PT services  PT Diagnosis Abnormality of gait;Generalized weakness;Acute pain   PT Problem List Decreased strength;Decreased activity tolerance;Decreased balance;Decreased mobility;Decreased coordination;Decreased knowledge of use of DME;Decreased safety awareness;Decreased knowledge of precautions;Pain  PT Treatment Interventions DME instruction;Gait training;Stair training;Functional mobility training;Therapeutic activities;Therapeutic exercise;Balance training;Neuromuscular re-education;Patient/family education   PT Goals (Current goals can be found in the Care Plan section) Acute Rehab PT Goals Patient Stated Goal: to get home with my kids  PT Goal Formulation: With patient Time For Goal Achievement: 03/15/14 Potential to Achieve Goals: Good    Frequency Min 5X/week   Barriers to discharge        Co-evaluation               End of Session Equipment Utilized During Treatment: Gait belt Activity Tolerance: Patient tolerated treatment well Patient left: in chair;with call bell/phone within reach;with chair alarm set Nurse Communication: Mobility status;Precautions         Time: 9628-3662 PT Time Calculation (min): 23 min   Charges:   PT Evaluation $Initial PT Evaluation Tier I: 1 Procedure PT Treatments $Gait Training: 8-22 mins   PT G  CodesGustavus [name], [place]  [phone] 03/08/2014, 10:12 AM

## 2014-03-09 ENCOUNTER — Inpatient Hospital Stay (HOSPITAL_COMMUNITY): Payer: BC Managed Care – PPO

## 2014-03-09 DIAGNOSIS — C3490 Malignant neoplasm of unspecified part of unspecified bronchus or lung: Secondary | ICD-10-CM

## 2014-03-09 DIAGNOSIS — G8220 Paraplegia, unspecified: Secondary | ICD-10-CM

## 2014-03-09 LAB — COMPREHENSIVE METABOLIC PANEL
ALBUMIN: 2.7 g/dL — AB (ref 3.5–5.2)
ALT: 90 U/L — ABNORMAL HIGH (ref 0–53)
ANION GAP: 13 (ref 5–15)
AST: 45 U/L — ABNORMAL HIGH (ref 0–37)
Alkaline Phosphatase: 213 U/L — ABNORMAL HIGH (ref 39–117)
BUN: 17 mg/dL (ref 6–23)
CHLORIDE: 101 meq/L (ref 96–112)
CO2: 26 mEq/L (ref 19–32)
Calcium: 9 mg/dL (ref 8.4–10.5)
Creatinine, Ser: 0.74 mg/dL (ref 0.50–1.35)
GFR calc Af Amer: >90 mL/min (ref >90)
Glucose, Bld: 112 mg/dL — ABNORMAL HIGH (ref 70–99)
Potassium: 4 mEq/L (ref 3.7–5.3)
Sodium: 140 mEq/L (ref 137–147)
Total Protein: 7 g/dL (ref 6.0–8.3)

## 2014-03-09 LAB — CBC WITH DIFFERENTIAL/PLATELET
BASOS ABS: 0 10*3/uL (ref 0.0–0.1)
Basophils Relative: 0 % (ref 0–1)
Eosinophils Absolute: 0 10*3/uL (ref 0.0–0.7)
Eosinophils Relative: 0 % (ref 0–5)
HCT: 34.4 % — ABNORMAL LOW (ref 39.0–52.0)
Hemoglobin: 10.8 g/dL — ABNORMAL LOW (ref 13.0–17.0)
Lymphocytes Relative: 6 % — ABNORMAL LOW (ref 12–46)
Lymphs Abs: 1.1 10*3/uL (ref 0.7–4.0)
MCH: 24.4 pg — ABNORMAL LOW (ref 26.0–34.0)
MCHC: 31.4 g/dL (ref 30.0–36.0)
MCV: 77.8 fL — ABNORMAL LOW (ref 78.0–100.0)
Monocytes Absolute: 0.6 10*3/uL (ref 0.1–1.0)
Monocytes Relative: 3 % (ref 3–12)
NEUTROS ABS: 16.5 10*3/uL — AB (ref 1.7–7.7)
Neutrophils Relative %: 91 % — ABNORMAL HIGH (ref 43–77)
Platelets: 361 10*3/uL (ref 150–400)
RBC: 4.42 MIL/uL (ref 4.22–5.81)
RDW: 14.7 % (ref 11.5–15.5)
WBC: 18.3 10*3/uL — AB (ref 4.0–10.5)

## 2014-03-09 LAB — GLUCOSE, CAPILLARY: GLUCOSE-CAPILLARY: 108 mg/dL — AB (ref 70–99)

## 2014-03-09 LAB — BETA HCG QUANT (REF LAB): Beta hCG, Tumor Marker: <2 m[IU]/mL (ref <5.0)

## 2014-03-09 MED ORDER — GADOBENATE DIMEGLUMINE 529 MG/ML IV SOLN
15.0000 mL | Freq: Once | INTRAVENOUS | Status: AC
  Administered 2014-03-09: 13 mL via INTRAVENOUS

## 2014-03-09 NOTE — Progress Notes (Signed)
Patient ID: Luis [name], male   DOB: 1966/05/03, 48 y.o.   MRN: [id number] Vital signs are stable. Patient feels reasonably improved. Pain seems under reasonable control Final path pending Radiation oncology consult should be obtained also

## 2014-03-09 NOTE — Progress Notes (Addendum)
Hemovac removed per pt and Dr. No drainage noted.

## 2014-03-09 NOTE — Progress Notes (Signed)
I will begin insurance authorization for a possible inpt rehab admission when pt felt medically ready for d/c. ? Tomorrow. I will discuss with pt and family also. [phone]

## 2014-03-09 NOTE — Progress Notes (Signed)
Physical Therapy Treatment Patient Details Name: Luis [name] MRN: [id number] DOB: [birth year] Today's Date: 03/09/2014    History of Present Illness 48 yo male underwent T1-2 laminectomy and decompression of spinal cord resectionof malignant metastatic tumor.     PT Comments    Pt very motivated and eager to participate in therapy, pt stated "i like to walk". Pt continues to demo balance deficits with gt and c/o dizziness with mobility. Recommend pt ambulate with RW when up with nursing. Continue to recommend CIR; pending palliative consult and POC.   Follow Up Recommendations  CIR     Equipment Recommendations  Rolling walker with 5" wheels    Recommendations for Other Services       Precautions / Restrictions Precautions Precautions: Cervical;Fall Precaution Comments: reinforced cervical precautions  Restrictions Weight Bearing Restrictions: No    Mobility  Bed Mobility Overal bed mobility: Needs Assistance Bed Mobility: Sidelying to Sit;Rolling;Sit to Sidelying Rolling: Supervision Sidelying to sit: Supervision     Sit to sidelying: Supervision General bed mobility comments: cues for log rolling technique due to cervical precautions   Transfers Overall transfer level: Needs assistance Equipment used: Rolling walker (2 wheeled) Transfers: Sit to/from Stand Sit to Stand: Min guard         General transfer comment: min guard to balance and max cues for hand placement/safety with RW  Ambulation/Gait Ambulation/Gait assistance: Min assist Ambulation Distance (Feet): 150 Feet Assistive device: Rolling walker (2 wheeled) Gait Pattern/deviations: Ataxic;Decreased dorsiflexion - left;Shuffle;Narrow base of support (circumducts Lt LE to clear Lt foot PRN) Gait velocity: decreased   General Gait Details: pt more steady with RW; continues to demo difficulty and LOB with high level balance activities; min (A) to balance with directional changes; Lt LE weaker and pt  circumducts at times to clear Lt foot    Stairs            Wheelchair Mobility    Modified Rankin (Stroke Patients Only)       Balance Overall balance assessment: Needs assistance Sitting-balance support: Feet supported;No upper extremity supported Sitting balance-Leahy Scale: Good                       High level balance activites: Direction changes;Head turns;Turns High Level Balance Comments: LOB with directional changes; min (A) to maintain balance and manage RW; c/o dizziness with head turns     Cognition Arousal/Alertness: Awake/alert Behavior During Therapy: WFL for tasks assessed/performed Overall Cognitive Status: Within Functional Limits for tasks assessed                      Exercises      General Comments        Pertinent Vitals/Pain Pain Assessment: 0-10 Pain Score: 2  Pain Location: surgical site Pain Descriptors / Indicators: Aching Pain Intervention(s): Monitored during session;Premedicated before session;Repositioned    Home Living                      Prior Function            PT Goals (current goals can now be found in the care plan section) Acute Rehab PT Goals Patient Stated Goal: to get home with my kids  PT Goal Formulation: With patient Time For Goal Achievement: 03/15/14 Potential to Achieve Goals: Good Progress towards PT goals: Progressing toward goals    Frequency  Min 5X/week    PT Plan Current plan remains appropriate  Co-evaluation             End of Session Equipment Utilized During Treatment: Gait belt Activity Tolerance: Patient tolerated treatment well Patient left: in bed;with call bell/phone within reach;with bed alarm set;with family/visitor present     Time: 9150-5697 PT Time Calculation (min): 16 min  Charges:  $Gait Training: 8-22 mins                    G CodesElie Confer [place], [place] 03/09/2014, 5:06 PM

## 2014-03-09 NOTE — Consult Note (Signed)
Radiation Oncology         [phone] ________________________________  Initial inpatient Consultation  Name: Fatih Stalvey MRN: [id number]  Date: 03/06/2014  DOB: 1965-12-07  CC:Pcp Not In System  No ref. 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 male who is originally from [place], with 4 month history of neck and shoulder pain.  Patient subsequently developed bilateral lower extremity weakness, unable to walk since 8/21, nausea and vomiting. On 8/22, he noted inability to void. He had no unintentional weight loss.He presented to [facility] on 8/23.  Multiple lytic and bony lesions on MRI L spine were see, with L2 pathologic compression fracture, destructive lesion on the 10th rib with extraosseous extension of tumor. Had follow up chest CT remarkable for a 2.5 x 2.1 LUL nodule concerning for possible Bronchogenic carcinoma, innumerable smaller pulmonary nodules scattered throughout the lungs (although more pronounced on the upper lung) without definite hilar or mediastinal lymphadenopathy and a large right rib lesion, among other osseous mets, including a T2 lesion with soft tissue extension encroaching the spinal canal. Oncology was also consulted at that facility, Dr. Ma Hillock.  He was to undergo ar CT guided biopsy at [facility]. However this was stopped given neurological complaints. He received Decadron IV and he was transferred to [facility] for further evaluation and management of possible cord compression. At the time, he had SPEP and UPEP drawn, but the results are unavailable due to transfer here. Calcium was normal. LDH upper normal at 213. Of note tumor markers drawn on 8/25 are as follows: AFP 1.9, CA 19.9 1.1, CEA 3.2, PSA 0.76 . AlkPhos was elevated at 258, now 123.  On arrival, he  underwent MRI of the T spine, showing confluent metastatic disease to the upper thoracic spine with some destruction in the posterior ribs.  He had emergent surgical decompression via laminectomy at the T1-T2 level with biopsies taken for formal diagnosis (Dr. Ellene Route). He is now able to move his extremities(4/10), ambulate with assistance, and sensation has increased. His pain is better controlled. He is still unable to urinate, but his constipation is resolved.  He has kindly been referred to discuss possible radiation options.  PREVIOUS RADIATION THERAPY: No  PAST MEDICAL HISTORY:  has a past medical history of HTN (hypertension).    PAST SURGICAL HISTORY: Past Surgical History  Procedure Laterality Date  . Laminectomy N/A 03/07/2014    Procedure: T1-T2 Laminectomy with Decompression of Cord and Removal of Cancer  thoracic one/two;  Surgeon: Kristeen Miss, MD;  Location: [place] [facility] ORS;  Service: Neurosurgery;  Laterality: N/A;    FAMILY HISTORY: family history includes Cancer in his mother; Heart disease in his father; Hypertension in his brother and sister.  SOCIAL HISTORY:  reports that he has never smoked. He does not have any smokeless tobacco history on file. He reports that he drinks about .5 ounces of alcohol per week. He reports that he does not use illicit drugs.  ALLERGIES: Review of patient's allergies indicates no known allergies.  MEDICATIONS:  Current Facility-Administered Medications  Medication Dose Route Frequency Provider Last Rate Last Dose  . 0.9 %  sodium chloride infusion  250 mL Intravenous Continuous Kristeen Miss, MD      . 0.9 %  sodium chloride infusion   Intravenous Continuous Kristeen Miss, MD 75 mL/hr at  03/08/14 2120    . acetaminophen (TYLENOL) tablet 650 mg  650 mg Oral Q4H PRN Kristeen Miss, MD       Or  . acetaminophen (TYLENOL) suppository 650 mg  650 mg Rectal Q4H PRN Kristeen Miss, MD      . alum & mag hydroxide-simeth (MAALOX/MYLANTA) 200-200-20 MG/5ML  suspension 30 mL  30 mL Oral Q6H PRN Kristeen Miss, MD      . dexamethasone (DECADRON) injection 4 mg  4 mg Intravenous 4 times per day Robbie Lis, MD   4 mg at 03/09/14 1601  . hydrochlorothiazide (HYDRODIURIL) tablet 25 mg  25 mg Oral Daily Kristeen Miss, MD   25 mg at 03/08/14 1212  . HYDROcodone-acetaminophen (NORCO/VICODIN) 5-325 MG per tablet 1-2 tablet  1-2 tablet Oral Q4H PRN Kristeen Miss, MD      . HYDROmorphone (DILAUDID) injection 0.25-0.5 mg  0.25-0.5 mg Intravenous Q5 min PRN Duane Boston, MD   0.5 mg at 03/07/14 1858  . HYDROmorphone (DILAUDID) injection 0.5-1 mg  0.5-1 mg Intravenous Q2H PRN Kristeen Miss, MD      . lisinopril (PRINIVIL,ZESTRIL) tablet 40 mg  40 mg Oral Daily Kristeen Miss, MD   40 mg at 03/08/14 1212  . menthol-cetylpyridinium (CEPACOL) lozenge 3 mg  1 lozenge Oral PRN Kristeen Miss, MD       Or  . phenol (CHLORASEPTIC) mouth spray 1 spray  1 spray Mouth/Throat PRN Kristeen Miss, MD      . methocarbamol (ROBAXIN) tablet 500 mg  500 mg Oral Q6H PRN Kristeen Miss, MD   500 mg at 03/07/14 1831   Or  . methocarbamol (ROBAXIN) 500 mg in dextrose 5 % 50 mL IVPB  500 mg Intravenous Q6H PRN Kristeen Miss, MD      . metoprolol succinate (TOPROL-XL) 24 hr tablet 50 mg  50 mg Oral QHS Kristeen Miss, MD   50 mg at 03/08/14 2129  . morphine 2 MG/ML injection 1-2 mg  1-2 mg Intravenous Q4H PRN Shanda Howells, MD      . ondansetron [facility]) injection 4 mg  4 mg Intravenous Q4H PRN Kristeen Miss, MD      . oxyCODONE-acetaminophen (PERCOCET/ROXICET) 5-325 MG per tablet 1-2 tablet  1-2 tablet Oral Q4H PRN Kristeen Miss, MD   1 tablet at 03/08/14 2129  . promethazine (PHENERGAN) injection 6.25-12.5 mg  6.25-12.5 mg Intravenous Q15 min PRN Duane Boston, MD      . sodium chloride 0.9 % injection 3 mL  3 mL Intravenous Q12H Kristeen Miss, MD   3 mL at 03/08/14 2131  . sodium chloride 0.9 % injection 3 mL  3 mL Intravenous PRN Kristeen Miss, MD      . tuberculin injection 5 Units  5 Units  Intradermal Once Shanda Howells, MD        REVIEW OF SYSTEMS:  A 15 point review of systems is documented in the electronic medical record. This was obtained by the nursing staff. However, I reviewed this with the patient to discuss relevant findings and make appropriate changes.  Pertinent items are noted in HPI.   PHYSICAL EXAM:  height is 5\' 7"  (1.702 m) and weight is 147 lb 14.4 oz (67.087 kg). His oral temperature is 97.7 F (36.5 C). His blood pressure is 150/87 and his pulse is 87. His respiration is 18 and oxygen saturation is 100%.   Per Medical Oncology:  GENERAL:alert, no distress and comfortable  SKIN: skin color, texture, turgor are normal, no rashes or significant  lesions  EYES: normal, conjunctiva are pink and non-injected, sclera clear  OROPHARYNX:no exudate, no erythema and lips, buccal mucosa, and tongue normal  NECK: supple, thyroid normal size, non-tender, without nodularity  LYMPH: no palpable lymphadenopathy in the cervical, axillary or inguinal area  LUNGS: clear to auscultation and percussion with normal breathing effort  HEART: regular rate & rhythm and no murmurs and no lower extremity edema  ABDOMEN:abdomen soft, non-tender and normal bowel sounds  Musculoskeletal:no cyanosis of digits and no clubbing  PSYCH: alert & oriented x 3 with fluent speech  NEURO: strength 3/5 bilaterally in lower extremities, sensation 7/10. No other focal abnormalities   KPS = 40  100 - Normal; no complaints; no evidence of disease. 90   - Able to carry on normal activity; minor signs or symptoms of disease. 80   - Normal activity with effort; some signs or symptoms of disease. 24   - Cares for self; unable to carry on normal activity or to do active work. 60   - Requires occasional assistance, but is able to care for most of his personal needs. 50   - Requires considerable assistance and frequent medical care. 26   - Disabled; requires special care and assistance. 57   - Severely  disabled; hospital admission is indicated although death not imminent. 60   - Very sick; hospital admission necessary; active supportive treatment necessary. 10   - Moribund; fatal processes progressing rapidly. 0     - Dead  Karnofsky DA, Abelmann [place], Craver LS and Burchenal JH [phone]) The use of the nitrogen mustards in the palliative treatment of carcinoma: with particular reference to bronchogenic carcinoma Cancer 1 634-56  LABORATORY DATA:  Lab Results  Component Value Date   WBC 18.8* 03/08/2014   HGB 9.7* 03/08/2014   HCT 30.4* 03/08/2014   MCV 77.4* 03/08/2014   PLT 335 03/08/2014   Lab Results  Component Value Date   NA 137 03/08/2014   K 4.3 03/08/2014   CL 100 03/08/2014   CO2 26 03/08/2014   Lab Results  Component Value Date   ALT 65* 03/08/2014   AST 29 03/08/2014   ALKPHOS 180* 03/08/2014   BILITOT <0.2* 03/08/2014     RADIOGRAPHY: Mr Thoracic Spine W Wo Contrast  03/07/2014   ADDENDUM REPORT: 03/07/2014 09:28  ADDENDUM: Critical Value/emergent results were called by telephone at the time of interpretation on 03/07/2014 at 0923 hr to Dr. Charlies Silvers who verbally acknowledged these results.   Electronically Signed   By: Lars Pinks M.D.   On: 03/07/2014 09:28   03/07/2014   CLINICAL DATA:  48 year old male with progressive Back pain and paraplegia. 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  EXAM: MRI THORACIC SPINE WITHOUT AND WITH CONTRAST  TECHNIQUE: Multiplanar and multiecho pulse sequences of the thoracic spine were obtained without and with intravenous contrast.  CONTRAST:  78mL MULTIHANCE GADOBENATE DIMEGLUMINE 529 MG/ML IV SOLN  COMPARISON:  Chest CT 03/06/2014 and earlier.  FINDINGS: Bulky enhancing tumor throughout the upper thoracic spine and paraspinal soft tissues. Confluent disease affecting the T1, T2, and T3 posterior elements and medial ribs, greater on the left. Nearly circumferential epidural tumor results, with mild cord compression at T2 (series 7, image 7),  and T3 (series 7, image 11). Obliteration of the left T1 and T2 neural foramina.  Confluent tumor throughout the T4 vertebra. Ventral epidural extension on the right without cord compression at this time.  Pathologic compression fracture of T5, with  bulky pedicle and posterior element tumor on the left resulting in mild cord compression (series 7, image 19). The left T5 neural foramen is obliterated.  Vertebral and epidural tumor at T6 without cord compression at this time.  Bulky and expansile right T7 rib mass (series 7, image 25). Similar expansile left T10 rib mass. Lower thoracic vertebral body metastases. Without additional epidural tumor  No abnormal intradural enhancement identified.  Stable lung parenchyma and visualized abdominal viscera from recent CTs.  IMPRESSION: 1. Confluent metastatic disease to the upper thoracic spine. Epidural and paraspinal tumor resulting in mild cord compression at the T2, T3, and T5 spinal cord levels. No cord edema identified. 2. Tumor filling the left T1, T2, and T5 neural foramina. 3. Epidural tumor without cord compression also at the T4 and T6 levels. 4. Metastatic destruction of several posterior ribs.  Electronically Signed: By: Lars Pinks M.D. On: 03/07/2014 08:51   Dg Thoracic Spine 1 View  03/07/2014   CLINICAL DATA:  Thoracic laminectomy for tumor removal  EXAM: OPERATIVE THORACIC SPINE 1 VIEW(S)  COMPARISON:  Thoracic MRI 03/07/2014  FINDINGS: Tip of endotracheal tube projects 3.2 cm above carina.  Metallic marker projects over C7 and T1 at the midline.  Thoracic spine inadequately visualized due to technique.  IMPRESSION: Limited exam showing no definite acute thoracic spine abnormalities.   Electronically Signed   By: Lavonia Dana M.D.   On: 03/07/2014 18:30      IMPRESSION: 48 yo gentleman presenting with widespread bone metastases and a lung nodule s/p T1-2 laminectomy for cord compression.  While pathology remains pending, malignancy is presumed at this  time.  The patient is recovering from surgery with improved neuro function, but, may require ongoing rehab to optimize mobility.  Once malignancy is established, more detailed treatment recommendations will be appropriate.  I would anticipate that the patient may benefit from adjuvant radiotherapy to the upper thoracic spine to reduce the risk of further spinal cord injury.  Radiotherapy in this setting should be considered no sooner than 14 days post-op to optimize incision healing.  PLAN:Today, I talked to the patient and family about the findings and work-up thus far.  We discussed the natural history of cord compression from malignancy and general treatment, highlighting the role or radiotherapy in the management.  We discussed the available radiation techniques, and focused on the details of logistics and delivery.  We reviewed the anticipated acute and late sequelae associated with radiation in this setting.  The patient was encouraged to ask questions that I answered to the best of my ability.   We will await pathology and recovery, and follow-up with further recommendations.  I spent 60 minutes minutes face to face with the patient and more than 50% of that time was spent in counseling and/or coordination of care.   ------------------------------------------------  Sheral Apley. Tammi Klippel, M.D.

## 2014-03-09 NOTE — Progress Notes (Signed)
Patient ID: Luis [name]  male  JGO:[id number]    DOB: [birth year]    DOA: 03/06/2014  PCP: Pcp Not In System  Assessment/Plan: Principal Problem:  Metastatic Spinal cord tumor: Patient had presented with pain across base of neck, shoulders, bilateral lower extremity weakness.  - MRI of the thoracic spine showed bony destructive lesions in the vertebraeT1-T2 T3-T4 and T5. Worst area of spinal cord compression is at T1-T2. Metastatic destruction of several posterior ribs. - Neurosurgery was consulted and patient underwent T1-T2 laminectomy and decompression of spinal cord, resection of malignant metastatic tumor and biopsy. Biopsy results are still pending - Appreciate oncology, awaiting radiation oncology recommendations - MRI of the brain done today: Positive for brain metastasis, limited to cerebellum at this time, cerebellar edema with mass effect including on the fourth ventricle but no impending herniation, 3 cm left vertex skull metastasis - Continue IV Decadron - Patient had chest CT at [place] remarkable for a 2.5 x 2.1 LUL nodule concerning for possible Bronchogenic Ca,  innumerable smaller pulmonary nodules (more pronounced on the upper lung) without definite hilar or mediastinal lymphadenopathy, large right rib lesion, among other osseous mets, including a T2 lesion with soft tissue extension encroaching the spinal canal   Active Problems:   Weakness - PT, OT evaluation done, recommending CIR vs SNF    Essential hypertension, benign - Currently stable, continue Toprol, lisinopril, HCTZ  DVT Prophylaxis:  Code Status:  Family Communication:Discussed with patient in detail   Disposition: Unclear at this time, awaiting pathology, now also metastatic to brain, will discuss with oncology. ?SNF  Consultants:  Neurosurgery, Dr Ellene Route   Oncology Dr Earlie Server   Radiation oncology  Procedures: T1-T2 laminectomy and decompression of spinal cord, resection of malignant metastatic  tumor and biopsy.   Antibiotics:  none    Subjective: Patient seen and examined, no acute complaints, states that strehgth is improving  Objective: Weight change:   Intake/Output Summary (Last 24 hours) at 03/09/14 1235 Last data filed at 03/09/14 0800  Gross per 24 hour  Intake    200 ml  Output   2860 ml  Net  -2660 ml   Blood pressure 110/73, pulse 87, temperature 98.1 F (36.7 C), temperature source Oral, resp. rate 18, height 5\' 7"  (1.702 m), weight 67.087 kg (147 lb 14.4 oz), SpO2 100.00%.  Physical Exam: General: Alert and awake, oriented x3, not in any acute distress. CVS: S1-S2 clear, no murmur rubs or gallops Chest: clear to auscultation bilaterally, no wheezing, rales or rhonchi Abdomen: soft nontender, nondistended, normal bowel sounds  Extremities: no cyanosis, clubbing or edema noted bilaterally   Lab Results: Basic Metabolic Panel:  Recent Labs Lab 03/08/14 0430 03/09/14 0808  NA 137 140  K 4.3 4.0  CL 100 101  CO2 26 26  GLUCOSE 123* 112*  BUN 16 17  CREATININE 0.76 0.74  CALCIUM 8.8 9.0   Liver Function Tests:  Recent Labs Lab 03/08/14 0430 03/09/14 0808  AST 29 45*  ALT 65* 90*  ALKPHOS 180* 213*  BILITOT <0.2* <0.2*  PROT 6.9 7.0  ALBUMIN 2.7* 2.7*   No results found for this basename: LIPASE, AMYLASE,  in the last 168 hours No results found for this basename: AMMONIA,  in the last 168 hours CBC:  Recent Labs Lab 03/08/14 0430 03/09/14 0808  WBC 18.8* 18.3*  NEUTROABS 16.9* 16.5*  HGB 9.7* 10.8*  HCT 30.4* 34.4*  MCV 77.4* 77.8*  PLT 335 361   Cardiac Enzymes: No results  found for this basename: CKTOTAL, CKMB, CKMBINDEX, TROPONINI,  in the last 168 hours BNP: No components found with this basename: POCBNP,  CBG:  Recent Labs Lab 03/09/14 1151  GLUCAP 108*     Micro Results: No results found for this or any previous visit (from the past 240 hour(s)).  Studies/Results: Mr Thoracic Spine Moise Boring  Contrast  03/07/2014   ADDENDUM REPORT: 03/07/2014 09:28  ADDENDUM: Critical Value/emergent results were called by telephone at the time of interpretation on 03/07/2014 at 0923 hr to Dr. Charlies Silvers who verbally acknowledged these results.   Electronically Signed   By: Lars Pinks M.D.   On: 03/07/2014 09:28   03/07/2014   CLINICAL DATA:  48 year old male with progressive Back pain and paraplegia. 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  EXAM: MRI THORACIC SPINE WITHOUT AND WITH CONTRAST  TECHNIQUE: Multiplanar and multiecho pulse sequences of the thoracic spine were obtained without and with intravenous contrast.  CONTRAST:  26mL MULTIHANCE GADOBENATE DIMEGLUMINE 529 MG/ML IV SOLN  COMPARISON:  Chest CT 03/06/2014 and earlier.  FINDINGS: Bulky enhancing tumor throughout the upper thoracic spine and paraspinal soft tissues. Confluent disease affecting the T1, T2, and T3 posterior elements and medial ribs, greater on the left. Nearly circumferential epidural tumor results, with mild cord compression at T2 (series 7, image 7), and T3 (series 7, image 11). Obliteration of the left T1 and T2 neural foramina.  Confluent tumor throughout the T4 vertebra. Ventral epidural extension on the right without cord compression at this time.  Pathologic compression fracture of T5, with bulky pedicle and posterior element tumor on the left resulting in mild cord compression (series 7, image 19). The left T5 neural foramen is obliterated.  Vertebral and epidural tumor at T6 without cord compression at this time.  Bulky and expansile right T7 rib mass (series 7, image 25). Similar expansile left T10 rib mass. Lower thoracic vertebral body metastases. Without additional epidural tumor  No abnormal intradural enhancement identified.  Stable lung parenchyma and visualized abdominal viscera from recent CTs.  IMPRESSION: 1. Confluent metastatic disease to the upper thoracic spine. Epidural and paraspinal tumor resulting in mild  cord compression at the T2, T3, and T5 spinal cord levels. No cord edema identified. 2. Tumor filling the left T1, T2, and T5 neural foramina. 3. Epidural tumor without cord compression also at the T4 and T6 levels. 4. Metastatic destruction of several posterior ribs.  Electronically Signed: By: Lars Pinks M.D. On: 03/07/2014 08:51   Dg Thoracic Spine 1 View  03/07/2014   CLINICAL DATA:  Thoracic laminectomy for tumor removal  EXAM: OPERATIVE THORACIC SPINE 1 VIEW(S)  COMPARISON:  Thoracic MRI 03/07/2014  FINDINGS: Tip of endotracheal tube projects 3.2 cm above carina.  Metallic marker projects over C7 and T1 at the midline.  Thoracic spine inadequately visualized due to technique.  IMPRESSION: Limited exam showing no definite acute thoracic spine abnormalities.   Electronically Signed   By: Lavonia Dana M.D.   On: 03/07/2014 18:30    Medications: Scheduled Meds: . dexamethasone  4 mg Intravenous 4 times per day  . hydrochlorothiazide  25 mg Oral Daily  . lisinopril  40 mg Oral Daily  . metoprolol succinate  50 mg Oral QHS  . sodium chloride  3 mL Intravenous Q12H  . tuberculin  5 Units Intradermal Once      LOS: 3 days   Sameera Betton M.D. [facility] 03/09/2014, 12:35 PM Pager: [phone]  If 7PM-7AM, please  contact night-coverage [email or web address] Password [email or web address]  **Disclaimer: This note was dictated with voice recognition software. Similar sounding words can inadvertently be transcribed and this note may contain transcription errors which may not have been corrected upon publication of note.**

## 2014-03-10 ENCOUNTER — Telehealth: Payer: Self-pay | Admitting: Medical Oncology

## 2014-03-10 LAB — COMPREHENSIVE METABOLIC PANEL
ALBUMIN: 2.6 g/dL — AB (ref 3.5–5.2)
ALK PHOS: 280 U/L — AB (ref 39–117)
ALT: 116 U/L — ABNORMAL HIGH (ref 0–53)
AST: 49 U/L — AB (ref 0–37)
Anion gap: 13 (ref 5–15)
BUN: 17 mg/dL (ref 6–23)
CO2: 27 mEq/L (ref 19–32)
CREATININE: 0.85 mg/dL (ref 0.50–1.35)
Calcium: 9.1 mg/dL (ref 8.4–10.5)
Chloride: 98 mEq/L (ref 96–112)
GFR calc Af Amer: >90 mL/min (ref >90)
GFR calc non Af Amer: >90 mL/min (ref >90)
Glucose, Bld: 112 mg/dL — ABNORMAL HIGH (ref 70–99)
Potassium: 4.7 mEq/L (ref 3.7–5.3)
Sodium: 138 mEq/L (ref 137–147)
Total Bilirubin: <0.2 mg/dL — ABNORMAL LOW (ref 0.3–1.2)
Total Protein: 6.9 g/dL (ref 6.0–8.3)

## 2014-03-10 LAB — CBC WITH DIFFERENTIAL/PLATELET
BASOS ABS: 0 10*3/uL (ref 0.0–0.1)
Basophils Relative: 0 % (ref 0–1)
EOS PCT: 0 % (ref 0–5)
Eosinophils Absolute: 0 10*3/uL (ref 0.0–0.7)
HEMATOCRIT: 34.7 % — AB (ref 39.0–52.0)
Hemoglobin: 11.1 g/dL — ABNORMAL LOW (ref 13.0–17.0)
LYMPHS PCT: 9 % — AB (ref 12–46)
Lymphs Abs: 1.7 10*3/uL (ref 0.7–4.0)
MCH: 24.9 pg — ABNORMAL LOW (ref 26.0–34.0)
MCHC: 32 g/dL (ref 30.0–36.0)
MCV: 78 fL (ref 78.0–100.0)
MONO ABS: 0.9 10*3/uL (ref 0.1–1.0)
Monocytes Relative: 5 % (ref 3–12)
Neutro Abs: 16.8 10*3/uL — ABNORMAL HIGH (ref 1.7–7.7)
Neutrophils Relative %: 86 % — ABNORMAL HIGH (ref 43–77)
Platelets: 344 10*3/uL (ref 150–400)
RBC: 4.45 MIL/uL (ref 4.22–5.81)
RDW: 15 % (ref 11.5–15.5)
WBC: 19.4 10*3/uL — AB (ref 4.0–10.5)

## 2014-03-10 MED ORDER — DEXAMETHASONE 2 MG PO TABS
2.0000 mg | ORAL_TABLET | Freq: Three times a day (TID) | ORAL | Status: DC
  Administered 2014-03-11 (×2): 2 mg via ORAL
  Filled 2014-03-10 (×2): qty 1

## 2014-03-10 NOTE — Progress Notes (Signed)
I discussed with Dr. Naaman Plummer. Pt not in need of intense inpt rehab admission at this high functional level. I discussed with pt and then SW to assess for possible d/c home with [facility] and family support or SNF. I will contact Dr. Tana Coast. [phone]

## 2014-03-10 NOTE — Progress Notes (Signed)
  Radiation Oncology         [phone] ________________________________  Name: Luis [name] MRN: [id number]  Date: 03/06/2014  DOB: February 29, 1966  Chart Note:  Recent events noted.  Brain MRI shows brain metastases.  At this point, the patient may benefit from radiotherapy, either in the form of whole brain radiation versus SRS, depending on tumor histology.  Pathology appears to still be pending.  Disposition is also pending.  Patient indicated a preference to return to [facility] for rehab.  Once pathology and disposition are established, we will make recommendations regarding radiation options for the brain as well as the spine.  Please feel free to page me once pathology and disposition are established for immediate planning - pager [phone] ________________________________  Sheral Apley. Tammi Klippel, M.D.

## 2014-03-10 NOTE — Progress Notes (Signed)
Patient ID: Luis [name]  male  WLN:[id number]    DOB: [birth year]    DOA: 03/06/2014  PCP: Pcp Not In System  Assessment/Plan: Principal Problem:  Metastatic Spinal cord tumor: Patient had presented with pain across base of neck, shoulders, bilateral lower extremity weakness.  - MRI of the thoracic spine showed bony destructive lesions in the vertebraeT1-T2 T3-T4 and T5. Worst area of spinal cord compression is at T1-T2. Metastatic destruction of several posterior ribs. - Neurosurgery was consulted and patient underwent T1-T2 laminectomy and decompression of spinal cord, resection of malignant metastatic tumor and biopsy.  - MRI of the brain: Positive for brain metastasis, limited to cerebellum at this time, cerebellar edema with mass effect including on the fourth ventricle but no impending herniation, 3 cm left vertex skull metastasis - Continue IV Decadron - Patient had chest CT at [place] remarkable for a 2.5 x 2.1 LUL nodule concerning for possible Bronchogenic Ca,  innumerable smaller pulmonary nodules (more pronounced on the upper lung) without definite hilar or mediastinal lymphadenopathy, large right rib lesion, among other osseous mets, including a T2 lesion with soft tissue extension encroaching the spinal canal - spoke with Dr Earlie Server, recommended continue with disposition planning, once final biopsies available, oncology will plan further with PET scan. Dr. Julien Nordmann will arrange outpatient followup. - I also spoke with Dr. Tammi Klippel, radiation oncology again once final biopsies available, plan for further radiation therapy.  -Biopsy results are still pending   Active Problems:   Weakness - PT, OT evaluation done, recommending CIR vs SNF    Essential hypertension, benign - Currently stable, continue Toprol, lisinopril, HCTZ  DVT Prophylaxis:  Code Status:  Family Communication: Discussed with patient in detail   Disposition: Unclear at this time, awaiting pathology also, CIR vs  SNF vs home PT   Consultants:  Neurosurgery, Dr Ellene Route   Oncology Dr Earlie Server   Radiation oncology  Procedures: T1-T2 laminectomy and decompression of spinal cord, resection of malignant metastatic tumor and biopsy.   Antibiotics:  none    Subjective: Patient seen and examined, no acute complaints, wound VAC removed yesterday  Objective: Weight change:   Intake/Output Summary (Last 24 hours) at 03/10/14 1154 Last data filed at 03/10/14 0600  Gross per 24 hour  Intake    300 ml  Output   3400 ml  Net  -3100 ml   Blood pressure 120/76, pulse 90, temperature 97.9 F (36.6 C), temperature source Oral, resp. rate 20, height 5\' 7"  (1.702 m), weight 67.087 kg (147 lb 14.4 oz), SpO2 100.00%.  Physical Exam: General: Alert and awake, oriented x3, not in any acute distress. CVS: S1-S2 clear, no murmur rubs or gallops Chest: clear to auscultation bilaterally, no wheezing, rales or rhonchi Abdomen: soft nontender, nondistended, normal bowel sounds  Extremities: no cyanosis, clubbing or edema noted bilaterally   Lab Results: Basic Metabolic Panel:  Recent Labs Lab 03/09/14 0808 03/10/14 0641  NA 140 138  K 4.0 4.7  CL 101 98  CO2 26 27  GLUCOSE 112* 112*  BUN 17 17  CREATININE 0.74 0.85  CALCIUM 9.0 9.1   Liver Function Tests:  Recent Labs Lab 03/09/14 0808 03/10/14 0641  AST 45* 49*  ALT 90* 116*  ALKPHOS 213* 280*  BILITOT <0.2* <0.2*  PROT 7.0 6.9  ALBUMIN 2.7* 2.6*   No results found for this basename: LIPASE, AMYLASE,  in the last 168 hours No results found for this basename: AMMONIA,  in the last 168 hours CBC:  Recent Labs Lab 03/09/14 0808 03/10/14 0641  WBC 18.3* 19.4*  NEUTROABS 16.5* 16.8*  HGB 10.8* 11.1*  HCT 34.4* 34.7*  MCV 77.8* 78.0  PLT 361 344   Cardiac Enzymes: No results found for this basename: CKTOTAL, CKMB, CKMBINDEX, TROPONINI,  in the last 168 hours BNP: No components found with this basename: POCBNP,   CBG:  Recent Labs Lab 03/09/14 1151  GLUCAP 108*     Micro Results: No results found for this or any previous visit (from the past 240 hour(s)).  Studies/Results: Mr Thoracic Spine Moise Boring Contrast  03/07/2014   ADDENDUM REPORT: 03/07/2014 09:28  ADDENDUM: Critical Value/emergent results were called by telephone at the time of interpretation on 03/07/2014 at 0923 hr to Dr. Charlies Silvers who verbally acknowledged these results.   Electronically Signed   By: Lars Pinks M.D.   On: 03/07/2014 09:28   03/07/2014   CLINICAL DATA:  48 year old male with progressive Back pain and paraplegia. 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  EXAM: MRI THORACIC SPINE WITHOUT AND WITH CONTRAST  TECHNIQUE: Multiplanar and multiecho pulse sequences of the thoracic spine were obtained without and with intravenous contrast.  CONTRAST:  5mL MULTIHANCE GADOBENATE DIMEGLUMINE 529 MG/ML IV SOLN  COMPARISON:  Chest CT 03/06/2014 and earlier.  FINDINGS: Bulky enhancing tumor throughout the upper thoracic spine and paraspinal soft tissues. Confluent disease affecting the T1, T2, and T3 posterior elements and medial ribs, greater on the left. Nearly circumferential epidural tumor results, with mild cord compression at T2 (series 7, image 7), and T3 (series 7, image 11). Obliteration of the left T1 and T2 neural foramina.  Confluent tumor throughout the T4 vertebra. Ventral epidural extension on the right without cord compression at this time.  Pathologic compression fracture of T5, with bulky pedicle and posterior element tumor on the left resulting in mild cord compression (series 7, image 19). The left T5 neural foramen is obliterated.  Vertebral and epidural tumor at T6 without cord compression at this time.  Bulky and expansile right T7 rib mass (series 7, image 25). Similar expansile left T10 rib mass. Lower thoracic vertebral body metastases. Without additional epidural tumor  No abnormal intradural enhancement  identified.  Stable lung parenchyma and visualized abdominal viscera from recent CTs.  IMPRESSION: 1. Confluent metastatic disease to the upper thoracic spine. Epidural and paraspinal tumor resulting in mild cord compression at the T2, T3, and T5 spinal cord levels. No cord edema identified. 2. Tumor filling the left T1, T2, and T5 neural foramina. 3. Epidural tumor without cord compression also at the T4 and T6 levels. 4. Metastatic destruction of several posterior ribs.  Electronically Signed: By: Lars Pinks M.D. On: 03/07/2014 08:51   Dg Thoracic Spine 1 View  03/07/2014   CLINICAL DATA:  Thoracic laminectomy for tumor removal  EXAM: OPERATIVE THORACIC SPINE 1 VIEW(S)  COMPARISON:  Thoracic MRI 03/07/2014  FINDINGS: Tip of endotracheal tube projects 3.2 cm above carina.  Metallic marker projects over C7 and T1 at the midline.  Thoracic spine inadequately visualized due to technique.  IMPRESSION: Limited exam showing no definite acute thoracic spine abnormalities.   Electronically Signed   By: Lavonia Dana M.D.   On: 03/07/2014 18:30    Medications: Scheduled Meds: . dexamethasone  4 mg Intravenous 4 times per day  . hydrochlorothiazide  25 mg Oral Daily  . lisinopril  40 mg Oral Daily  . metoprolol succinate  50 mg Oral QHS  . sodium  chloride  3 mL Intravenous Q12H  . tuberculin  5 Units Intradermal Once      LOS: 4 days   Sebastiano Luecke M.D. [facility] 03/10/2014, 11:54 AM Pager: [phone]  If 7PM-7AM, please contact night-coverage [email or web address] Password [email or web address]  **Disclaimer: This note was dictated with voice recognition software. Similar sounding words can inadvertently be transcribed and this note may contain transcription errors which may not have been corrected upon publication of note.**

## 2014-03-10 NOTE — Progress Notes (Signed)
Physical Therapy Treatment Patient Details Name: Luis [name] MRN: [id number] DOB: March 07, 1966 Today's Date: 03/10/2014    History of Present Illness 48 yo male underwent T1-2 laminectomy and decompression of spinal cord resectionof malignant metastatic tumor.     PT Comments    Pt continues to make good progress with all aspects of mobility.  Continues to demonstrate decreased balance when challenged with head turns, direction changes and changes in gait speed.  Feel he would benefit from CIR to increase independence at home.  Note pts poor prognosis, and therefore continue to recommend palliative care consult.   Follow Up Recommendations  CIR     Equipment Recommendations  Rolling walker with 5" wheels    Recommendations for Other Services       Precautions / Restrictions Precautions Precautions: Cervical;Fall Precaution Comments: reinforced cervical precautions  Restrictions Weight Bearing Restrictions: No    Mobility  Bed Mobility Overal bed mobility: Needs Assistance Bed Mobility: Supine to Sit Rolling: Supervision Sidelying to sit: Supervision       General bed mobility comments: S to maintain cervical precautions.   Transfers Overall transfer level: Needs assistance Equipment used: Rolling walker (2 wheeled) Transfers: Sit to/from Stand Sit to Stand: Supervision         General transfer comment: S with cues for hand placement and safety.   Ambulation/Gait Ambulation/Gait assistance: Min assist;Min guard Ambulation Distance (Feet): 500 Feet Assistive device: Rolling walker (2 wheeled);None Gait Pattern/deviations: Step-through pattern;Decreased dorsiflexion - left;Trunk flexed Gait velocity: decreased   General Gait Details: Pt continues to improve balance with gait with use of RW, therefore challenged pt without use of RW during session working on increasing gait speed, direction changes, and head turns.  Pt continues to have increased difficulty with head  turns and reports of increased dizziness.     Stairs Stairs: Yes Stairs assistance: Min assist Stair Management: One rail Right;Alternating pattern;Forwards Number of Stairs: 11 General stair comments: Pt did great ascending stairs, however note decreased quad control when descending stairs.    Wheelchair Mobility    Modified Rankin (Stroke Patients Only)       Balance Overall balance assessment: Needs assistance Sitting-balance support: Feet supported Sitting balance-Leahy Scale: Good                       High level balance activites: Direction changes;Turns;Head turns High Level Balance Comments: LOB and dizziness with head turns    Cognition Arousal/Alertness: Awake/alert Behavior During Therapy: WFL for tasks assessed/performed Overall Cognitive Status: Within Functional Limits for tasks assessed       Memory: Decreased recall of precautions              Exercises      General Comments General comments (skin integrity, edema, etc.): discussed how rehab would work and that ELOS would be relatively short      Pertinent Vitals/Pain Pain Assessment: 0-10 Pain Score: 3  Pain Location: surgical site Pain Descriptors / Indicators: Aching Pain Intervention(s): Monitored during session;Repositioned    Home Living                      Prior Function            PT Goals (current goals can now be found in the care plan section) Acute Rehab PT Goals Patient Stated Goal: to get home with my kids  PT Goal Formulation: With patient Time For Goal Achievement: 03/15/14 Potential to Achieve Goals: Good Progress  towards PT goals: Progressing toward goals    Frequency  Min 5X/week    PT Plan Current plan remains appropriate    Co-evaluation             End of Session Equipment Utilized During Treatment: Gait belt Activity Tolerance: Patient tolerated treatment well Patient left: in chair;with call bell/phone within reach      Time: 0923-0948 PT Time Calculation (min): 25 min  Charges:  $Gait Training: 8-22 mins $Neuromuscular Re-education: 8-22 mins                    G Codes:      Denice Bors 03/10/2014, 10:13 AM

## 2014-03-10 NOTE — Telephone Encounter (Signed)
Biopsy reported and note to [facility].

## 2014-03-10 NOTE — Progress Notes (Signed)
I met with pt at bedside. I discussed inpt rehab admission vs home with [facility] pending [employer] decision today. I doubt insurance will approve. I explained this to pt and to Dr. Tana Coast. I will follow up today. [phone]

## 2014-03-10 NOTE — Progress Notes (Signed)
Occupational Therapy Treatment Patient Details Name: Luis [name] MRN: [id number] DOB: 08/09/1965 Today's Date: 03/10/2014    History of present illness 48 yo male underwent T1-2 laminectomy and decompression of spinal cord resectionof malignant metastatic tumor.    OT comments  Pt demonstrates RT LE buckle and balance deficits. Pt is high fall risk and remains CIR candidate. OT to address dynamic standing balance and basic transfer. Based on DGI score pt fall risk for bathroom level transfers for bathing.   Follow Up Recommendations  CIR    Equipment Recommendations  None recommended by OT    Recommendations for Other Services Rehab consult;Other (comment)    Precautions / Restrictions Precautions Precautions: Cervical;Fall Precaution Comments: reinforced cervical precautions  Restrictions Weight Bearing Restrictions: No       Mobility Bed Mobility Overal bed mobility: Needs Assistance Bed Mobility: Supine to Sit Rolling: Supervision Sidelying to sit: Supervision       General bed mobility comments: S to maintain cervical precautions.   Transfers Overall transfer level: Needs assistance Equipment used: Rolling walker (2 wheeled) Transfers: Sit to/from Stand Sit to Stand: Min guard         General transfer comment: Pt demonstrates ability to power up with bil UE    Balance Overall balance assessment: Needs assistance Sitting-balance support: Feet supported Sitting balance-Leahy Scale: Good                       High level balance activites: Direction changes;Turns;Head turns High Level Balance Comments: LOB and dizziness with head turns   ADL Overall ADL's : Needs assistance/impaired             Lower Body Bathing: Minimal assistance       Lower Body Dressing: Minimal assistance   Toilet Transfer: Minimal assistance (without DME)             General ADL Comments: Session focused on balance assessment and education. DGI performed and  pt scoring 4 however the vertical head turns was omitted due to cervical precautions. Pt with drift to the Rt and left attempting to rotate trunk with ambulation to maintain cervical precautions. pt with LOB going upstairs and down stairs. pt with multiple buckles with RT LE with ambulation. pt required extended time to walk backwards with narrowed base of support. Pt PTA was ambulating without DME. pt demonstrates high fall risk and has not reached baseline. Based on this balance assessment: bathroom transfers are a fall risk, picking up objects during adls and carrying them to the next location ( clothes to chair to dress) incr fall risk. Pt remains excellent CIR candidate to reach MOD I. Pt currently MIN- MOD (A) ambulation in house hold level .      Vision                     Perception     Praxis      Cognition   Behavior During Therapy: WFL for tasks assessed/performed Overall Cognitive Status: Within Functional Limits for tasks assessed       Memory: Decreased recall of precautions               Extremity/Trunk Assessment               Exercises     Shoulder Instructions       General Comments      Pertinent Vitals/ Pain       Pain Assessment: 0-10 Pain Score: 3  Pain Location: surgical site Pain Descriptors / Indicators: Aching Pain Intervention(s): Monitored during session;Repositioned  Home Living                                          Prior Functioning/Environment              Frequency Min 2X/week     Progress Toward Goals  OT Goals(current goals can now be found in the care plan section)  Progress towards OT goals: Progressing toward goals  Acute Rehab OT Goals Patient Stated Goal: to get home with my kids  OT Goal Formulation: With patient Time For Goal Achievement: 03/22/14 Potential to Achieve Goals: Good ADL Goals Pt Will Perform Grooming: with supervision;standing Pt Will Perform Upper Body Bathing:  with supervision;standing Pt Will Transfer to Toilet: with supervision;regular height toilet Additional ADL Goal #1: Pt will complete adl with supervision dynamic standing balance  Plan Discharge plan remains appropriate    Co-evaluation                 End of Session Equipment Utilized During Treatment: Gait belt   Activity Tolerance Patient tolerated treatment well   Patient Left in bed;with call bell/phone within reach   Nurse Communication Mobility status;Precautions        Time: 4010-2725 OT Time Calculation (min): 11 min  Charges: OT General Charges $OT Visit: 1 Procedure OT Treatments $Therapeutic Activity: 8-22 mins  Parke Poisson B 03/10/2014, 12:02 PM Pager: [phone]

## 2014-03-11 LAB — COMPREHENSIVE METABOLIC PANEL
ALT: 336 U/L — ABNORMAL HIGH (ref 0–53)
ANION GAP: 13 (ref 5–15)
AST: 147 U/L — AB (ref 0–37)
Albumin: 2.9 g/dL — ABNORMAL LOW (ref 3.5–5.2)
Alkaline Phosphatase: 342 U/L — ABNORMAL HIGH (ref 39–117)
BUN: 22 mg/dL (ref 6–23)
CO2: 28 meq/L (ref 19–32)
CREATININE: 0.8 mg/dL (ref 0.50–1.35)
Calcium: 9.3 mg/dL (ref 8.4–10.5)
Chloride: 98 mEq/L (ref 96–112)
GFR calc Af Amer: >90 mL/min (ref >90)
GFR calc non Af Amer: >90 mL/min (ref >90)
Glucose, Bld: 106 mg/dL — ABNORMAL HIGH (ref 70–99)
Potassium: 3.9 mEq/L (ref 3.7–5.3)
Sodium: 139 mEq/L (ref 137–147)
Total Bilirubin: 0.3 mg/dL (ref 0.3–1.2)
Total Protein: 7.6 g/dL (ref 6.0–8.3)

## 2014-03-11 LAB — CBC WITH DIFFERENTIAL/PLATELET
BASOS ABS: 0.1 10*3/uL (ref 0.0–0.1)
Basophils Relative: 0 % (ref 0–1)
Eosinophils Absolute: 0 10*3/uL (ref 0.0–0.7)
Eosinophils Relative: 0 % (ref 0–5)
HCT: 36.6 % — ABNORMAL LOW (ref 39.0–52.0)
Hemoglobin: 11.9 g/dL — ABNORMAL LOW (ref 13.0–17.0)
LYMPHS ABS: 2.8 10*3/uL (ref 0.7–4.0)
Lymphocytes Relative: 11 % — ABNORMAL LOW (ref 12–46)
MCH: 24.9 pg — ABNORMAL LOW (ref 26.0–34.0)
MCHC: 32.5 g/dL (ref 30.0–36.0)
MCV: 76.7 fL — ABNORMAL LOW (ref 78.0–100.0)
Monocytes Absolute: 1.6 10*3/uL — ABNORMAL HIGH (ref 0.1–1.0)
Monocytes Relative: 6 % (ref 3–12)
NEUTROS ABS: 20.6 10*3/uL — AB (ref 1.7–7.7)
NEUTROS PCT: 83 % — AB (ref 43–77)
Platelets: 382 10*3/uL (ref 150–400)
RBC: 4.77 MIL/uL (ref 4.22–5.81)
RDW: 15.1 % (ref 11.5–15.5)
WBC: 25 10*3/uL — AB (ref 4.0–10.5)

## 2014-03-11 MED ORDER — TRAMADOL HCL 50 MG PO TABS: 50.0000 mg | ORAL_TABLET | Freq: Three times a day (TID) | ORAL | Status: DC | PRN

## 2014-03-11 MED ORDER — HYDROCODONE-ACETAMINOPHEN 5-325 MG PO TABS: 1.0000 | ORAL_TABLET | ORAL | Status: DC | PRN

## 2014-03-11 MED ORDER — DEXAMETHASONE 2 MG PO TABS: 2.0000 mg | ORAL_TABLET | Freq: Three times a day (TID) | ORAL | Status: DC

## 2014-03-11 MED ORDER — METHOCARBAMOL 500 MG PO TABS: 500.0000 mg | ORAL_TABLET | Freq: Four times a day (QID) | ORAL | Status: DC | PRN

## 2014-03-11 MED ORDER — ONDANSETRON 4 MG PO TBDP: 4.0000 mg | ORAL_TABLET | Freq: Three times a day (TID) | ORAL | Status: DC | PRN

## 2014-03-11 MED ORDER — SENNOSIDES-DOCUSATE SODIUM 8.6-50 MG PO TABS: 1.0000 | ORAL_TABLET | Freq: Every day | ORAL | Status: DC

## 2014-03-11 NOTE — Discharge Summary (Signed)
Physician Discharge Summary  Patient ID: Luis [name] MRN: [id number] DOB/AGE: 50-Mar-1967 50 y.o.  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 generalized Weakness . 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 Essential hypertension, benign . Metastasis  Consults: Neurosurgery, Dr. Ellene Route                   Oncology, Dr. Julien Nordmann                   Radiation oncology, Dr. Tammi Klippel  Recommendations for Outpatient Follow-up:  Patient had a preference to receive his oncology care at [facility], hence Dr. Tammi Klippel will arrange radiation therapy schedule at [facility]. I explained to the patient to call Dr. Johny Shears office on Monday at [facility] for further guidance.  I also discussed with Dr. Julien Nordmann, he reported that further management including PET scan will be arranged outpatient by Dr. Julien Nordmann and [place].  Allergies:  No Known Allergies   Discharge Medications:   Medication List    STOP taking these medications       cyclobenzaprine 5 MG tablet  Commonly known as:  FLEXERIL     meloxicam 15 MG tablet  Commonly known as:  MOBIC      TAKE these medications       dexamethasone 2 MG tablet  Commonly known as:  DECADRON  Take 1 tablet (2 mg total) by mouth 3 (three) times daily.     hydrochlorothiazide 25 MG tablet  Commonly known as:  HYDRODIURIL  Take 25 mg by mouth daily.     HYDROcodone-acetaminophen 5-325 MG per tablet  Commonly known as:  NORCO/VICODIN  Take 1-2 tablets by mouth every 4 (four) hours as needed for moderate pain or severe pain (mild pain).     hydrocortisone valerate ointment 0.2 %  Commonly known as:  WEST-CORT  Apply 1 application topically 2 (two) times daily. Applied to affected area twice daily     lisinopril 40 MG tablet  Commonly known as:  PRINIVIL,ZESTRIL   Take 40 mg by mouth daily.     methocarbamol 500 MG tablet  Commonly known as:  ROBAXIN  Take 1 tablet (500 mg total) by mouth every 6 (six) hours as needed for muscle spasms.     metoprolol succinate 50 MG 24 hr tablet  Commonly known as:  TOPROL-XL  Take 50 mg by mouth at bedtime.     ondansetron 4 MG disintegrating tablet  Commonly known as:  ZOFRAN ODT  Take 1 tablet (4 mg total) by mouth every 8 (eight) hours as needed for nausea or vomiting.     senna-docusate 8.6-50 MG per tablet  Commonly known as:  SENOKOT S  Take 1-2 tablets by mouth at bedtime. For constipation     traMADol 50 MG tablet  Commonly known as:  ULTRAM  Take 1 tablet (50 mg total) by mouth every 8 (eight) hours as needed for moderate pain. For pain         Brief H and P: For complete details please refer to admission H and P, but in briefThis is a 48 y.o. year old male with significant past medical history of HTN presenting with back pain, weakness. Pt states that he has had progressive back pain over the past 3-4 months. Pt states that he saw  his PCP about the issue within the past 2-3 weeks. Had ? xrays that were benign. Was rxd anti inflammatories and tramadol/flexeril with minimal to mild improvement in sxs. Pt states that he developed progressive LE weakness and intermittent urinary retention over the course of the weekend. Also with abd pain, nausea, vomiting. Pt went to [facility] because of sxs. Was found to have multiple lytic and bony lesions on MRI L spine. Had follow up chest CT that showed roughly 3x2x1 cm RUL nodule concerning for ? Bronchogenic carcinoma with multiple pulmonary and bony nodules. Oncology was also consulted. SPEP and UPEP were ordered. Was planned for CT guided bx biopsy by H-O at [facility]. However this was stopped given neuro complaints. PPD also ordered out of concern for possible TB (pt originally from [place]). Oncologist from [facility] discussed case with Dr. Ellene Route from [place] out of concern for  sxs of spinal cord compression. Dr. Ellene Route recommended transfer to [facility] for further eval to assess whether pt is surgical candidate. Pt received decadron 40mg  IV x1 prior to transfer. Reports minimal to mild improvement in sxs with decadron thus far. Currently with minimal abd pain, nausea, vomiting. Does have some mild leg pain bilaterally. Has not had colonoscopy. 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 MRI of the thoracic spine showed bony destructive lesions in the vertebraeT1-T2 T3-T4 and T5. Worst area of spinal cord compression is at T1-T2. Metastatic destruction of several posterior ribs.  Neurosurgery was consulted and patient underwent T1-T2 laminectomy and decompression of spinal cord, resection of malignant metastatic tumor and biopsyOn 03/07/14.  Patient was placed on IV Decadron transitioned to oral Decadron at discharge.  MRI of the brain: Positive for brain metastasis, limited to cerebellum at this time, cerebellar edema with mass effect including on the fourth ventricle but no impending herniation, 3 cm left vertex skull metastasis  Patient had chest CT at [facility] remarkable for a 2.5 x 2.1 LUL nodule concerning for possible Bronchogenic Ca, innumerable smaller pulmonary nodules (more pronounced on the upper lung) without definite hilar or mediastinal lymphadenopathy, large right rib lesion, among other osseous mets, including a T2 lesion with soft tissue extension encroaching the spinal canal  Biopsy results came back as adenocarcinoma primary to lung radiation oncology and oncology was consulted, patient was seen by Dr. Tammi Klippel and Dr. Julien Nordmann.  I spoke with Dr Earlie Server, recommended continue with disposition planning, oncology will plan  further  outpatient with PET scan. Dr. Julien Nordmann will arrange outpatient followup.  I also spoke with Dr. Tammi Klippel, radiation oncology and Dr. Tammi Klippel will arrange for the radiation therapy schedule outpatient.  Weakness  - PT, OT evaluation done and initially recommended  CIR however patient had significant improvement in his functional status postoperatively hence he was declined by CIR. Home PTOT, RN was arranged.  Essential hypertension, benign  - Currently stable, continue Toprol, lisinopril, HCTZ      Day of Discharge BP 126/82  Pulse 87  Temp(Src) 97.9 F (36.6 C) (Oral)  Resp 17  Ht 5\' 7"  (1.702 m)  Wt 64.683 kg (142 lb 9.6 oz)  BMI 22.33 kg/m2  SpO2 98%  Physical Exam: General: Alert and awake oriented x3 not in any acute distress. CVS: S1-S2 clear no murmur rubs or gallops Chest: clear to  auscultation bilaterally, no wheezing rales or rhonchi Abdomen: soft nontender, nondistended, normal bowel sounds Extremities: no cyanosis, clubbing or edema noted bilaterally Neuro: Cranial nerves II-XII intact, no focal neurological deficits   The results of significant diagnostics from this hospitalization (including imaging, microbiology, ancillary and laboratory) are listed below for reference.    LAB RESULTS: Basic Metabolic Panel:  Recent Labs Lab 03/10/14 0641 03/11/14 0649  NA 138 139  K 4.7 3.9  CL 98 98  CO2 27 28  GLUCOSE 112* 106*  BUN 17 22  CREATININE 0.85 0.80  CALCIUM 9.1 9.3   Liver Function Tests:  Recent Labs Lab 03/10/14 0641 03/11/14 0649  AST 49* 147*  ALT 116* 336*  ALKPHOS 280* 342*  BILITOT <0.2* 0.3  PROT 6.9 7.6  ALBUMIN 2.6* 2.9*   No results found for this basename: LIPASE, AMYLASE,  in the last 168 hours No results found for this basename: AMMONIA,  in the last 168 hours CBC:  Recent Labs Lab 03/10/14 0641 03/11/14 0649  WBC 19.4* 25.0*  NEUTROABS 16.8* 20.6*  HGB 11.1* 11.9*  HCT 34.7* 36.6*  MCV 78.0 76.7*  PLT 344  382   Cardiac Enzymes: No results found for this basename: CKTOTAL, CKMB, CKMBINDEX, TROPONINI,  in the last 168 hours BNP: No components found with this basename: POCBNP,  CBG:  Recent Labs Lab 03/09/14 1151  GLUCAP 108*    Significant Diagnostic Studies:  Mr Thoracic Spine W Wo Contrast  03/07/2014   ADDENDUM REPORT: 03/07/2014 09:28  ADDENDUM: Critical Value/emergent results were called by telephone at the time of interpretation on 03/07/2014 at 0923 hr to Dr. Charlies Silvers who verbally acknowledged these results.   Electronically Signed   By: Lars Pinks M.D.   On: 03/07/2014 09:28   03/07/2014   CLINICAL DATA:  48 year old male with progressive Back pain and paraplegia. 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  EXAM: MRI THORACIC SPINE WITHOUT AND WITH CONTRAST  TECHNIQUE: Multiplanar and multiecho pulse sequences of the thoracic spine were obtained without and with intravenous contrast.  CONTRAST:  93mL MULTIHANCE GADOBENATE DIMEGLUMINE 529 MG/ML IV SOLN  COMPARISON:  Chest CT 03/06/2014 and earlier.  FINDINGS: Bulky enhancing tumor throughout the upper thoracic spine and paraspinal soft tissues. Confluent disease affecting the T1, T2, and T3 posterior elements and medial ribs, greater on the left. Nearly circumferential epidural tumor results, with mild cord compression at T2 (series 7, image 7), and T3 (series 7, image 11). Obliteration of the left T1 and T2 neural foramina.  Confluent tumor throughout the T4 vertebra. Ventral epidural extension on the right without cord compression at this time.  Pathologic compression fracture of T5, with bulky pedicle and posterior element tumor on the left resulting in mild cord compression (series 7, image 19). The left T5 neural foramen is obliterated.  Vertebral and epidural tumor at T6 without cord compression at this time.  Bulky and expansile right T7 rib mass (series 7, image 25). Similar expansile left T10 rib mass. Lower thoracic vertebral  body metastases. Without additional epidural tumor  No abnormal intradural enhancement identified.  Stable lung parenchyma and visualized abdominal viscera from recent CTs.  IMPRESSION: 1. Confluent metastatic disease to the upper thoracic spine. Epidural and paraspinal tumor resulting in mild cord compression at the T2, T3, and T5 spinal cord levels. No cord edema identified. 2. Tumor filling the left T1, T2, and T5 neural foramina. 3. Epidural tumor without cord compression also at the T4 and  T6 levels. 4. Metastatic destruction of several posterior ribs.  Electronically Signed: By: Lars Pinks M.D. On: 03/07/2014 08:51   Dg Thoracic Spine 1 View  03/07/2014   CLINICAL DATA:  Thoracic laminectomy for tumor removal  EXAM: OPERATIVE THORACIC SPINE 1 VIEW(S)  COMPARISON:  Thoracic MRI 03/07/2014  FINDINGS: Tip of endotracheal tube projects 3.2 cm above carina.  Metallic marker projects over C7 and T1 at the midline.  Thoracic spine inadequately visualized due to technique.  IMPRESSION: Limited exam showing no definite acute thoracic spine abnormalities.   Electronically Signed   By: Lavonia Dana M.D.   On: 03/07/2014 18:30       Disposition and Follow-up: Discharge Instructions   Diet general    Complete by:  As directed      Increase activity slowly    Complete by:  As directed             DISPOSITION:Home   DIET: Regular    DISCHARGE FOLLOW-UP Follow-up Information   Follow up with Eilleen Kempf., MD. Call on 03/13/2014. (please call on Monday 8/31 for the appointment for hospital follow-up)    Specialty:  Oncology   Contact information:   [place] [address] [phone]       Follow up with Lora Paula, MD. Call on 03/13/2014. (please call on Monday 8/31 for the appointment for hospital follow-up)    Specialty:  Radiation Oncology   Contact information:   [place] [address] [phone]       Follow up with Earleen Newport,  MD. Schedule an appointment as soon as possible for a visit in 2 weeks. (for hospital follow-up, please call on Monday 8/31 for the appointment.)    Specialty:  Neurosurgery   Contact information:   [address] [place] [address] [phone]       Time spent on Discharge: 40 mins  Signed:   RAI,RIPUDEEP M.D. [facility] 03/11/2014, 10:54 AM Pager: [phone]   **Disclaimer: This note was dictated with voice recognition software. Similar sounding words can inadvertently be transcribed and this note may contain transcription errors which may not have been corrected upon publication of note.**

## 2014-03-11 NOTE — Anesthesia Postprocedure Evaluation (Signed)
  Anesthesia Post-op Note  Patient: Luis [name]  Procedure(s) Performed: Procedure(s): T1-T2 Laminectomy with Decompression of Cord and Removal of Cancer  thoracic one/two (N/A)  Patient Location: PACU  Anesthesia Type:General  Level of Consciousness: awake  Airway and Oxygen Therapy: Patient Spontanous Breathing  Post-op Pain: mild  Post-op Assessment: Post-op Vital signs reviewed, Patient's Cardiovascular Status Stable, Respiratory Function Stable, Patent Airway and Pain level controlled  Post-op Vital Signs: Reviewed  Last Vitals:  Filed Vitals:   03/11/14 1342  BP: 133/78  Pulse: 101  Temp: 36.8 C  Resp: 18    Complications: No apparent anesthesia complications

## 2014-03-11 NOTE — Care Management Note (Signed)
    Page 1 of 2   03/11/2014     4:54:16 PM CARE MANAGEMENT NOTE 03/11/2014  Patient:  SILVIO, SAUSEDO   Account Number:  [id number]  Date Initiated:  03/11/2014  Documentation initiated by:  [facility]  Subjective/Objective Assessment:   discharge planning     Action/Plan:   home   Anticipated DC Date:  03/11/2014   Anticipated DC Plan:  [place]  CM consult      [facility] Choice  HOME HEALTH   Choice offered to / List presented to:  C-1 Patient   DME arranged  Vassie Moselle      DME agency  [place] arranged  HH-1 RN  [place].   Status of service:  Completed, signed off Medicare Important Message given?   (If response is "NO", the following Medicare IM given date fields will be blank) Date Medicare IM given:   Medicare IM given by:   Date Additional Medicare IM given:   Additional Medicare IM given by:    Discharge Disposition:  [place]  Per UR Regulation:    If discussed at Long Length of Stay Meetings, dates discussed:    Comments:  03/11/14 10:48 CM met with pt in room to offer choice of home health agency.  Pt chooses [facility] to render HHPT/OT/RN. Referral called to [facility] rep, Stephanie.  CM called DME rep for delivery of rolling walker to pt's room prior to discharge.  No other CM needs were communicated.  Mariane Masters, BSN, /cm [phone].

## 2014-03-11 NOTE — Progress Notes (Signed)
Discharge instructions given. Pt verbalized understanding and all questions were answered. Pt stated that his ride will arrive around 1:30pm.

## 2014-03-13 ENCOUNTER — Telehealth: Payer: Self-pay | Admitting: Radiation Oncology

## 2014-03-13 NOTE — Telephone Encounter (Signed)
Faxed pathology report to Dr. Olena Leatherwood office. Fax number [phone]. Spoke with Butch Penny who verbalized she would ensure Dr. Baruch Gouty gets the path. Fax confirmation of delivery obtained.

## 2014-03-14 ENCOUNTER — Ambulatory Visit: Payer: Self-pay | Admitting: Internal Medicine

## 2014-03-15 ENCOUNTER — Ambulatory Visit: Payer: Self-pay | Admitting: Radiation Oncology

## 2014-03-28 ENCOUNTER — Encounter (HOSPITAL_COMMUNITY): Payer: Self-pay

## 2014-03-29 LAB — CEA: CEA: 4.4 ng/mL (ref 0.0–4.7)

## 2014-03-29 LAB — PSA: PSA: 0.7 ng/mL (ref 0.0–4.0)

## 2014-04-05 LAB — CBC CANCER CENTER
BASOS ABS: 0 x10 3/mm (ref 0.0–0.1)
BASOS PCT: 0.5 %
EOS PCT: 0.6 %
Eosinophil #: 0 x10 3/mm (ref 0.0–0.7)
HCT: 43.1 % (ref 40.0–52.0)
HGB: 13.6 g/dL (ref 13.0–18.0)
LYMPHS ABS: 0.7 x10 3/mm — AB (ref 1.0–3.6)
Lymphocyte %: 8.8 %
MCH: 25.3 pg — AB (ref 26.0–34.0)
MCHC: 31.5 g/dL — AB (ref 32.0–36.0)
MCV: 80 fL (ref 80–100)
MONO ABS: 0.5 x10 3/mm (ref 0.2–1.0)
MONOS PCT: 6.2 %
NEUTROS PCT: 83.9 %
Neutrophil #: 6.8 x10 3/mm — ABNORMAL HIGH (ref 1.4–6.5)
Platelet: 219 x10 3/mm (ref 150–440)
RBC: 5.37 10*6/uL (ref 4.40–5.90)
RDW: 19.4 % — AB (ref 11.5–14.5)
WBC: 8.1 x10 3/mm (ref 3.8–10.6)

## 2014-04-13 ENCOUNTER — Ambulatory Visit: Payer: Self-pay | Admitting: Radiation Oncology

## 2014-04-13 ENCOUNTER — Ambulatory Visit: Payer: Self-pay | Admitting: Internal Medicine

## 2014-05-02 LAB — HEPATIC FUNCTION PANEL A (ARMC)
ALBUMIN: 2.5 g/dL — AB (ref 3.4–5.0)
ALK PHOS: 393 U/L — AB
BILIRUBIN DIRECT: 0.2 mg/dL (ref 0.0–0.2)
Bilirubin,Total: 0.8 mg/dL (ref 0.2–1.0)
SGOT(AST): 177 U/L — ABNORMAL HIGH (ref 15–37)
SGPT (ALT): 335 U/L — ABNORMAL HIGH
Total Protein: 7.2 g/dL (ref 6.4–8.2)

## 2014-05-02 LAB — CBC CANCER CENTER
Basophil #: 0.1 x10 3/mm (ref 0.0–0.1)
Basophil %: 0.8 %
EOS ABS: 0.1 x10 3/mm (ref 0.0–0.7)
EOS PCT: 0.4 %
HCT: 45.9 % (ref 40.0–52.0)
HGB: 14.4 g/dL (ref 13.0–18.0)
Lymphocyte #: 2 x10 3/mm (ref 1.0–3.6)
Lymphocyte %: 14.8 %
MCH: 25.3 pg — AB (ref 26.0–34.0)
MCHC: 31.4 g/dL — AB (ref 32.0–36.0)
MCV: 81 fL (ref 80–100)
MONO ABS: 1.2 x10 3/mm — AB (ref 0.2–1.0)
Monocyte %: 8.7 %
NEUTROS ABS: 10.3 x10 3/mm — AB (ref 1.4–6.5)
Neutrophil %: 75.3 %
PLATELETS: 256 x10 3/mm (ref 150–440)
RBC: 5.68 10*6/uL (ref 4.40–5.90)
RDW: 19.4 % — AB (ref 11.5–14.5)
WBC: 13.7 x10 3/mm — AB (ref 3.8–10.6)

## 2014-05-02 LAB — BASIC METABOLIC PANEL
ANION GAP: 7 (ref 7–16)
BUN: 11 mg/dL (ref 7–18)
CALCIUM: 9.5 mg/dL (ref 8.5–10.1)
CHLORIDE: 98 mmol/L (ref 98–107)
Co2: 31 mmol/L (ref 21–32)
Creatinine: 1.15 mg/dL (ref 0.60–1.30)
Glucose: 100 mg/dL — ABNORMAL HIGH (ref 65–99)
Osmolality: 271 (ref 275–301)
Potassium: 3.3 mmol/L — ABNORMAL LOW (ref 3.5–5.1)
Sodium: 136 mmol/L (ref 136–145)

## 2014-05-14 ENCOUNTER — Ambulatory Visit: Payer: Self-pay | Admitting: Radiation Oncology

## 2014-05-14 ENCOUNTER — Ambulatory Visit: Payer: Self-pay | Admitting: Internal Medicine

## 2014-05-20 ENCOUNTER — Ambulatory Visit: Payer: Self-pay | Admitting: Neurological Surgery

## 2014-05-28 HISTORY — PX: CT GUIDED BIOPSY  (ARMC HX): HXRAD1397

## 2014-05-30 LAB — CBC CANCER CENTER
BASOS ABS: 0.1 x10 3/mm (ref 0.0–0.1)
BASOS PCT: 1 %
Eosinophil #: 0.1 x10 3/mm (ref 0.0–0.7)
Eosinophil %: 0.8 %
HCT: 39.2 % — ABNORMAL LOW (ref 40.0–52.0)
HGB: 12.5 g/dL — ABNORMAL LOW (ref 13.0–18.0)
LYMPHS ABS: 2.1 x10 3/mm (ref 1.0–3.6)
Lymphocyte %: 23.7 %
MCH: 24.9 pg — AB (ref 26.0–34.0)
MCHC: 31.9 g/dL — AB (ref 32.0–36.0)
MCV: 78 fL — ABNORMAL LOW (ref 80–100)
MONOS PCT: 11.5 %
Monocyte #: 1 x10 3/mm (ref 0.2–1.0)
NEUTROS PCT: 63 %
Neutrophil #: 5.4 x10 3/mm (ref 1.4–6.5)
Platelet: 440 x10 3/mm (ref 150–440)
RBC: 5.03 10*6/uL (ref 4.40–5.90)
RDW: 17.1 % — ABNORMAL HIGH (ref 11.5–14.5)
WBC: 8.6 x10 3/mm (ref 3.8–10.6)

## 2014-05-30 LAB — COMPREHENSIVE METABOLIC PANEL
ANION GAP: 11 (ref 7–16)
Albumin: 2.7 g/dL — ABNORMAL LOW (ref 3.4–5.0)
Alkaline Phosphatase: 303 U/L — ABNORMAL HIGH
BUN: 10 mg/dL (ref 7–18)
Bilirubin,Total: 0.3 mg/dL (ref 0.2–1.0)
CALCIUM: 9.3 mg/dL (ref 8.5–10.1)
CHLORIDE: 97 mmol/L — AB (ref 98–107)
CREATININE: 1.01 mg/dL (ref 0.60–1.30)
Co2: 25 mmol/L (ref 21–32)
GLUCOSE: 95 mg/dL (ref 65–99)
Osmolality: 265 (ref 275–301)
Potassium: 3.6 mmol/L (ref 3.5–5.1)
SGOT(AST): 23 U/L (ref 15–37)
SGPT (ALT): 38 U/L
Sodium: 133 mmol/L — ABNORMAL LOW (ref 136–145)
Total Protein: 7.8 g/dL (ref 6.4–8.2)

## 2014-05-30 LAB — MAGNESIUM: Magnesium: 1.8 mg/dL

## 2014-05-30 LAB — PHOSPHORUS: Phosphorus: 3.5 mg/dL (ref 2.5–4.9)

## 2014-05-31 LAB — BASIC METABOLIC PANEL
ANION GAP: 12 (ref 7–16)
BUN: 12 mg/dL (ref 7–18)
CHLORIDE: 96 mmol/L — AB (ref 98–107)
Calcium, Total: 8.8 mg/dL (ref 8.5–10.1)
Co2: 23 mmol/L (ref 21–32)
Creatinine: 1.12 mg/dL (ref 0.60–1.30)
EGFR (Non-African Amer.): >60
Glucose: 105 mg/dL — ABNORMAL HIGH (ref 65–99)
Osmolality: 263 (ref 275–301)
POTASSIUM: 3.6 mmol/L (ref 3.5–5.1)
Sodium: 131 mmol/L — ABNORMAL LOW (ref 136–145)

## 2014-05-31 LAB — CBC CANCER CENTER
Basophil #: 0.1 x10 3/mm (ref 0.0–0.1)
Basophil %: 1.3 %
Eosinophil #: 0.1 x10 3/mm (ref 0.0–0.7)
Eosinophil %: 0.6 %
HCT: 39.6 % — AB (ref 40.0–52.0)
HGB: 12.9 g/dL — AB (ref 13.0–18.0)
Lymphocyte #: 1.9 x10 3/mm (ref 1.0–3.6)
Lymphocyte %: 18.7 %
MCH: 25.1 pg — ABNORMAL LOW (ref 26.0–34.0)
MCHC: 32.6 g/dL (ref 32.0–36.0)
MCV: 77 fL — ABNORMAL LOW (ref 80–100)
MONO ABS: 0.9 x10 3/mm (ref 0.2–1.0)
Monocyte %: 9.1 %
Neutrophil #: 7.1 x10 3/mm — ABNORMAL HIGH (ref 1.4–6.5)
Neutrophil %: 70.3 %
Platelet: 428 x10 3/mm (ref 150–440)
RBC: 5.13 10*6/uL (ref 4.40–5.90)
RDW: 17.2 % — AB (ref 11.5–14.5)
WBC: 10.1 x10 3/mm (ref 3.8–10.6)

## 2014-06-13 ENCOUNTER — Ambulatory Visit: Payer: Self-pay | Admitting: Internal Medicine

## 2014-06-20 LAB — CBC CANCER CENTER
Basophil #: 0 x10 3/mm (ref 0.0–0.1)
Basophil %: 0.7 %
EOS PCT: 3.5 %
Eosinophil #: 0.2 x10 3/mm (ref 0.0–0.7)
HCT: 39.2 % — ABNORMAL LOW (ref 40.0–52.0)
HGB: 12.6 g/dL — ABNORMAL LOW (ref 13.0–18.0)
Lymphocyte #: 1.8 x10 3/mm (ref 1.0–3.6)
Lymphocyte %: 31.2 %
MCH: 25.1 pg — ABNORMAL LOW (ref 26.0–34.0)
MCHC: 32.2 g/dL (ref 32.0–36.0)
MCV: 78 fL — ABNORMAL LOW (ref 80–100)
MONO ABS: 0.5 x10 3/mm (ref 0.2–1.0)
MONOS PCT: 9 %
Neutrophil #: 3.1 x10 3/mm (ref 1.4–6.5)
Neutrophil %: 55.6 %
Platelet: 237 x10 3/mm (ref 150–440)
RBC: 5.03 10*6/uL (ref 4.40–5.90)
RDW: 18.1 % — ABNORMAL HIGH (ref 11.5–14.5)
WBC: 5.7 x10 3/mm (ref 3.8–10.6)

## 2014-06-20 LAB — COMPREHENSIVE METABOLIC PANEL
AST: 18 U/L (ref 15–37)
Albumin: 2.8 g/dL — ABNORMAL LOW (ref 3.4–5.0)
Alkaline Phosphatase: 317 U/L — ABNORMAL HIGH
Anion Gap: 9 (ref 7–16)
BUN: 4 mg/dL — ABNORMAL LOW (ref 7–18)
Bilirubin,Total: 0.4 mg/dL (ref 0.2–1.0)
CREATININE: 0.86 mg/dL (ref 0.60–1.30)
Calcium, Total: 8.4 mg/dL — ABNORMAL LOW (ref 8.5–10.1)
Chloride: 105 mmol/L (ref 98–107)
Co2: 26 mmol/L (ref 21–32)
EGFR (African American): >60
EGFR (Non-African Amer.): >60
GLUCOSE: 94 mg/dL (ref 65–99)
Osmolality: 276 (ref 275–301)
Potassium: 3.3 mmol/L — ABNORMAL LOW (ref 3.5–5.1)
SGPT (ALT): 24 U/L
Sodium: 140 mmol/L (ref 136–145)
Total Protein: 6.5 g/dL (ref 6.4–8.2)

## 2014-06-20 LAB — MAGNESIUM: Magnesium: 1.6 mg/dL — ABNORMAL LOW

## 2014-06-20 LAB — PHOSPHORUS: PHOSPHORUS: 3.2 mg/dL (ref 2.5–4.9)

## 2014-06-28 ENCOUNTER — Telehealth: Payer: Self-pay | Admitting: *Deleted

## 2014-06-28 NOTE — Telephone Encounter (Signed)
Received a note from [facility] neurology.  Dr Vista Mink saw pt in the hospital August 2015.  Gave Dr Vista Mink his consult note along with the neurology note to determine if pt needs to f/u with Dr Vista Mink on an outpatient basis.

## 2014-06-29 ENCOUNTER — Telehealth: Payer: Self-pay | Admitting: Medical Oncology

## 2014-06-29 NOTE — Telephone Encounter (Signed)
Mohamed notified. Faxed Dr Wallene Huh note to Dr Ma Hillock

## 2014-07-11 LAB — CBC CANCER CENTER
BASOS ABS: 0 x10 3/mm (ref 0.0–0.1)
BASOS PCT: 0.6 %
EOS PCT: 2.2 %
Eosinophil #: 0.2 x10 3/mm (ref 0.0–0.7)
HCT: 44 % (ref 40.0–52.0)
HGB: 14.1 g/dL (ref 13.0–18.0)
LYMPHS ABS: 1.8 x10 3/mm (ref 1.0–3.6)
Lymphocyte %: 24.4 %
MCH: 25.6 pg — AB (ref 26.0–34.0)
MCHC: 32 g/dL (ref 32.0–36.0)
MCV: 80 fL (ref 80–100)
Monocyte #: 0.7 x10 3/mm (ref 0.2–1.0)
Monocyte %: 9.2 %
Neutrophil #: 4.7 x10 3/mm (ref 1.4–6.5)
Neutrophil %: 63.6 %
PLATELETS: 285 x10 3/mm (ref 150–440)
RBC: 5.5 10*6/uL (ref 4.40–5.90)
RDW: 17.8 % — ABNORMAL HIGH (ref 11.5–14.5)
WBC: 7.4 x10 3/mm (ref 3.8–10.6)

## 2014-07-11 LAB — COMPREHENSIVE METABOLIC PANEL
ALBUMIN: 3.2 g/dL — AB (ref 3.4–5.0)
AST: 29 U/L (ref 15–37)
Alkaline Phosphatase: 190 U/L — ABNORMAL HIGH
Anion Gap: 9 (ref 7–16)
BUN: 7 mg/dL (ref 7–18)
Bilirubin,Total: 0.6 mg/dL (ref 0.2–1.0)
CALCIUM: 8.8 mg/dL (ref 8.5–10.1)
CHLORIDE: 104 mmol/L (ref 98–107)
CREATININE: 0.93 mg/dL (ref 0.60–1.30)
Co2: 29 mmol/L (ref 21–32)
EGFR (Non-African Amer.): >60
GLUCOSE: 104 mg/dL — AB (ref 65–99)
Osmolality: 281 (ref 275–301)
Potassium: 3.8 mmol/L (ref 3.5–5.1)
SGPT (ALT): 46 U/L
Sodium: 142 mmol/L (ref 136–145)
Total Protein: 7.5 g/dL (ref 6.4–8.2)

## 2014-07-11 LAB — PHOSPHORUS: Phosphorus: 3.3 mg/dL (ref 2.5–4.9)

## 2014-07-11 LAB — MAGNESIUM: MAGNESIUM: 1.7 mg/dL — AB

## 2014-07-14 ENCOUNTER — Ambulatory Visit: Payer: Self-pay | Admitting: Internal Medicine

## 2014-08-01 LAB — CBC CANCER CENTER
BASOS PCT: 0.8 %
Basophil #: 0.1 x10 3/mm (ref 0.0–0.1)
EOS ABS: 0.2 x10 3/mm (ref 0.0–0.7)
Eosinophil %: 3 %
HCT: 42.2 % (ref 40.0–52.0)
HGB: 13.7 g/dL (ref 13.0–18.0)
LYMPHS ABS: 1.8 x10 3/mm (ref 1.0–3.6)
Lymphocyte %: 26.3 %
MCH: 25.9 pg — ABNORMAL LOW (ref 26.0–34.0)
MCHC: 32.5 g/dL (ref 32.0–36.0)
MCV: 80 fL (ref 80–100)
MONO ABS: 0.6 x10 3/mm (ref 0.2–1.0)
Monocyte %: 9.6 %
NEUTROS ABS: 4.1 x10 3/mm (ref 1.4–6.5)
Neutrophil %: 60.3 %
Platelet: 250 x10 3/mm (ref 150–440)
RBC: 5.29 10*6/uL (ref 4.40–5.90)
RDW: 16.5 % — AB (ref 11.5–14.5)
WBC: 6.7 x10 3/mm (ref 3.8–10.6)

## 2014-08-01 LAB — COMPREHENSIVE METABOLIC PANEL
ALT: 50 U/L
AST: 24 U/L (ref 15–37)
Albumin: 3.4 g/dL (ref 3.4–5.0)
Alkaline Phosphatase: 152 U/L — ABNORMAL HIGH
Anion Gap: 9 (ref 7–16)
BUN: 8 mg/dL (ref 7–18)
Bilirubin,Total: 0.5 mg/dL (ref 0.2–1.0)
CHLORIDE: 104 mmol/L (ref 98–107)
Calcium, Total: 8.6 mg/dL (ref 8.5–10.1)
Co2: 27 mmol/L (ref 21–32)
Creatinine: 0.95 mg/dL (ref 0.60–1.30)
EGFR (African American): >60
EGFR (Non-African Amer.): >60
Glucose: 115 mg/dL — ABNORMAL HIGH (ref 65–99)
Osmolality: 279 (ref 275–301)
Potassium: 3.4 mmol/L — ABNORMAL LOW (ref 3.5–5.1)
Sodium: 140 mmol/L (ref 136–145)
Total Protein: 7.4 g/dL (ref 6.4–8.2)

## 2014-08-01 LAB — PHOSPHORUS: Phosphorus: 2.6 mg/dL (ref 2.5–4.9)

## 2014-08-01 LAB — MAGNESIUM: MAGNESIUM: 1.6 mg/dL — AB

## 2014-08-14 ENCOUNTER — Ambulatory Visit: Payer: Self-pay | Admitting: Radiation Oncology

## 2014-08-17 LAB — CALCIUM: Calcium, Total: 8.6 mg/dL (ref 8.5–10.1)

## 2014-09-12 ENCOUNTER — Ambulatory Visit
Admit: 2014-09-12 | Disposition: A | Discharge status: Home / Self Care | Payer: Self-pay | Attending: Radiation Oncology | Admitting: Radiation Oncology

## 2014-10-11 LAB — PHOSPHORUS: PHOSPHORUS: 3 mg/dL

## 2014-10-11 LAB — CBC CANCER CENTER
BASOS ABS: 0 x10 3/mm (ref 0.0–0.1)
Basophil %: 0.6 %
EOS PCT: 2.9 %
Eosinophil #: 0.2 x10 3/mm (ref 0.0–0.7)
HCT: 44.1 % (ref 40.0–52.0)
HGB: 14.3 g/dL (ref 13.0–18.0)
Lymphocyte #: 1.8 x10 3/mm (ref 1.0–3.6)
Lymphocyte %: 26.1 %
MCH: 26 pg (ref 26.0–34.0)
MCHC: 32.4 g/dL (ref 32.0–36.0)
MCV: 80 fL (ref 80–100)
MONOS PCT: 9.3 %
Monocyte #: 0.6 x10 3/mm (ref 0.2–1.0)
Neutrophil #: 4.2 x10 3/mm (ref 1.4–6.5)
Neutrophil %: 61.1 %
Platelet: 239 x10 3/mm (ref 150–440)
RBC: 5.51 10*6/uL (ref 4.40–5.90)
RDW: 14.3 % (ref 11.5–14.5)
WBC: 6.8 x10 3/mm (ref 3.8–10.6)

## 2014-10-11 LAB — COMPREHENSIVE METABOLIC PANEL
ALBUMIN: 3.9 g/dL
AST: 34 U/L
Alkaline Phosphatase: 69 U/L
Anion Gap: 5 — ABNORMAL LOW (ref 7–16)
BUN: 12 mg/dL
Bilirubin,Total: 0.8 mg/dL
CALCIUM: 8.8 mg/dL — AB
CHLORIDE: 103 mmol/L
Co2: 29 mmol/L
Creatinine: 0.85 mg/dL
Glucose: 87 mg/dL
Potassium: 2.8 mmol/L — ABNORMAL LOW
SGPT (ALT): 51 U/L
Sodium: 137 mmol/L
Total Protein: 7.2 g/dL

## 2014-10-11 LAB — MAGNESIUM: Magnesium: 1.8 mg/dL

## 2014-10-13 ENCOUNTER — Ambulatory Visit
Admit: 2014-10-13 | Disposition: A | Discharge status: Home / Self Care | Payer: Self-pay | Attending: Radiation Oncology | Admitting: Radiation Oncology

## 2014-10-13 ENCOUNTER — Ambulatory Visit
Admit: 2014-10-13 | Disposition: A | Discharge status: Home / Self Care | Payer: Self-pay | Attending: Internal Medicine | Admitting: Internal Medicine

## 2014-10-17 LAB — POTASSIUM: Potassium: 3.3 mmol/L — ABNORMAL LOW

## 2014-11-04 NOTE — Consult Note (Signed)
Reason for Visit: This 49 year old Male patient presents to the clinic for initial evaluation of  stage IV lung cancer .   Referred by Dr. Tammi Klippel.  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 He started having lower extremity weakness and was unable to ambulate when seen on 03/03/2014. MRI scan of his lumbar spine showed extensive  bony metastasis with L2 compression fracture. CT scan of the chest showed a 2.5 left upper lobe nodule concerning for possible primary bronchogenic carcinoma. He also had innumerable smaller pulmonary nodules. There was a large destructive right rib lesion. At T2 there was a large destructive mass encroaching the spinal canal and patient was transferred to  neurosurgical unit at [place] where he underwent emergent surgical decompression via laminectomy with biopsies positive for adenocarcinoma. Immunophenotype and overall morphology was consistent with lung primary. He had an MRI scan of his brain showing 2.8 cm posterior right cerebellar mass consistent with metastatic disease. There is also a small 1.7 CM posterior left cerebellar hemisphere enhancing mass is also at the left vertex expansile bone metastasis extending both into the scalp and soft tissue.  He is now seen for consideration of palliative treatment. He is doing fairly well he states his back  pain is much improved. He's had some good return of strength in his lower extremities  although is still ambulating with some difficulty. He uses a walker. At this time no focal neurologic deficits are apparent. He is using a walker for assistance.  Past Hx:    HTN:    back pain:   Past, Family and Social History:  Past Medical History positive   Cardiovascular hypertension   Past Surgical History laminectomy as described above   Family History positive   Family History Comments mother with cancer of unknown type father with cardiovascular heart disease also family history of hypertension.   Social History positive   Social History Comments no smoking history slight social EtOH use history   Additional Past Medical and Surgical History accompanied by family member today   Allergies:   No Known Allergies:   Home Meds:  Home Medications: Medication Instructions Status  cyclobenzaprine 5 mg oral tablet 1 tab(s) orally 3 times a day, As Needed Active  lisinopril 40 mg oral tablet 1 tab(s) orally once a day Active  Metoprolol Succinate ER 50 mg oral tablet, extended release 1 tab(s) orally once a day (at bedtime) Active  meloxicam 15 mg oral tablet 1 tab(s) orally once a day Active  hydrochlorothiazide 25 mg oral tablet 1 tab(s) orally once a day Active  traMADol 50 mg oral tablet 1 tab(s) orally every 8 hours, As Needed - for Pain Active  methocarbamol 500 mg oral tablet 1 tab(s) orally 4 times a day, As Needed - for Pain Active  Percocet 5/325 325 mg-5 mg oral tablet  orally 1-2 every 4 hours for pain Active  dexamethasone 2 mg oral tablet 1 tab(s) orally 3 times a day Active   Review of Systems:  General negative   Performance Status (ECOG) 0   Skin negative   Breast negative   Ophthalmologic negative   ENMT negative   Respiratory and Thorax negative   Cardiovascular negative   Gastrointestinal negative   Genitourinary negative   Musculoskeletal negative    Neurological negative   Psychiatric negative   Hematology/Lymphatics negative   Endocrine negative   Allergic/Immunologic negative   Review of Systems   denies any weight loss, fatigue, weakness, fever, chills or night sweats. Patient denies any loss of vision, blurred vision. Patient denies any ringing  of the ears or hearing loss. No irregular heartbeat. Patient denies heart murmur or history of fainting. Patient denies any chest pain or pain radiating to her upper extremities. Patient denies any shortness of breath, difficulty breathing at night, cough or hemoptysis. Patient denies any swelling in the lower legs. Patient denies any nausea vomiting, vomiting of blood, or coffee ground material in the vomitus. Patient denies any stomach pain. Patient states has had normal bowel movements no significant constipation or diarrhea. Patient denies any dysuria, hematuria or significant nocturia. Patient denies any problems walking, swelling in the joints or loss of balance. Patient denies any skin changes, loss of hair or loss of weight. Patient denies any excessive worrying or anxiety or significant depression. Patient denies any problems with insomnia. Patient denies excessive thirst, polyuria, polydipsia. Patient denies any swollen glands, patient denies easy bruising or easy bleeding. Patient denies any recent infections, allergies or URI. Patient "s visual fields have not changed significantly in recent time.   Nursing Notes:  Nursing Vital Signs and Chemo Nursing Nursing Notes: *CC Vital Signs Flowsheet:   02-Sep-15 13:24  Temp Temperature 98  Pulse Pulse 96  Respirations Respirations 21  SBP SBP 145  DBP DBP 96  Pain Scale (0-10)  7to 8  Current Weight (kg) (kg) 61.1   Physical Exam:  General/Skin/HEENT:  General normal   Skin normal   Eyes normal   ENMT normal   Head and Neck normal   Additional PE well-developed male in NAD. Cranial nerves II through XII are grossly  intact. Fundi appear benign bilaterally. Motor sensory and DTR levels appear equal and symmetric in the upper and lower extremities. Proprioception is intact. Visual fields are within normal range. Lungs are clear to A&P cardiac examination shows regular rate and rhythm. Abdomen is benign.laminectomy scar is healing well.   Breasts/Resp/CV/GI/GU:  Respiratory and Thorax normal   Cardiovascular normal   Gastrointestinal normal   Genitourinary normal   MS/Neuro/Psych/Lymph:  Musculoskeletal normal   Neurological normal   Lymphatics normal   Other Results:  Radiology Results: LabUnknown:    23-Aug-15 04:25, MRI Lumbar Spine [facility]  PACS Image     24-Aug-15 09:30, CT Chest Without Contrast  PACS Image   MRI:    23-Aug-15 04:25, MRI Lumbar Spine [facility]  MRI Lumbar Spine [facility]   REASON FOR EXAM:    Compression fractures, metastatic disease and urinary   retention  COMMENTS:       PROCEDURE: MR  - MR LUMBAR SPINE WO/W  - Mar 05 2014  4:25AM     CLINICAL DATA:  Low back pain, compression fractures, urinary  retention    EXAM:  MRI LUMBAR SPINE WITHOUT AND WITH CONTRAST    TECHNIQUE:  Multiplanar and multiecho pulse sequences of the lumbar spine were  obtained without and  with intravenous contrast.  CONTRAST:  12 cc of Magnevist.    COMPARISON:  Prior CT performed earlier the same day    FINDINGS:  For the purposes of this dictation, the lowest well-formed  intervertebral disc spaces presumed to be the L5-S1 level, and there  presumed to be 5 lumbar type vertebral bodies.    Conus medullaris terminates normally at the L1-2 level. Signal  intensity within the visualized spinal cord is normal. No  significant epidural tumor.    Vertebral bodies are normally aligned with preservation of the  normal lumbar lordosis. No listhesis.  Ill defined hypointense T1 signal intensity, hyperintense T2/STIR  signal intense lesion seen within the anterior aspect of the T12  vertebral  body, consistent with metastasis. There is an additional  partially visualized lesion within the posterior aspect of T11.    There is metastatic lesion involving the entirety of the L2  vertebral body. There is associated pathologic compression fracture  with approximately 50% of anterior height loss. There is minimal  convex bowing of the posterior contour of the L2 vertebral body  without significant retropulsion. Tumor extends into the posterior  elements bilaterally at this level.    Metastatic tumor also present within the spinous process of L1  (Series 7, image 9). Tumor present within the L4 spinous process.  1.2 cm lesion present within the right lamina/spinous process of L5  (series 7, image 9). 7 mm lesion present within the S3 segment.  There is a 1.1 cm within the left sacral ala. 1.9 x 2.3 cm lesion  present within the right sacral ala (series 6, image 37).    Paraspinous soft tissues within normal limits. No retroperitoneal  adenopathy. T2 hyperintense nonenhancing cyst noted within the left  kidney.    DEGENERATIVE CHANGES:    T11-12:  Negative.    T12-L1:  Negative.    L1-2: Mild diffuse disc desiccation without significant disc  protrusion. No significant facet arthrosis. No canal or neural  foraminal stenosis.    L2-3: Mild diffuse disc bulge with disc desiccation. No focal disc  protrusion. No canal or foraminal stenosis.    L3-4:  Negative.    L4-5:  Negative.    L5-S1: Mild diffuse disc desiccation with minimal disc bulge. There  is a shallow disc protrusion with associated annular fissure. No  significant canal or foraminal stenosis.     IMPRESSION:  1. Diffuse osseous metastases involving the lumbar spine and pelvis  as detailed above.  2. Pathologic compression fracture of the L2 vertebral body with  associated 50% of height loss without significant retropulsion.  3. No evidence of significant spinal canal stenosis or cord  compression within  the lumbar spine.      Electronically Signed    By: Jeannine Boga M.D.    On: 03/05/2014 04:57         Verified By: Neomia Glass, M.D.,  CT:    24-Aug-15 09:30, CT Chest Without Contrast  CT Chest Without Contrast   REASON FOR EXAM:    bone metastases, cough, eval for lung cancer  COMMENTS:       PROCEDURE: CT  - CT CHEST WITHOUT CONTRAST  - Mar 06 2014  9:30AM     CLINICAL DATA:  Metastatic disease to the bones. Evaluate for  potential lung primary.    EXAM:  CT CHEST WITHOUT CONTRAST    TECHNIQUE:  Multidetector CT imaging of the chest was performed following the  standard protocol without  IV contrast..  COMPARISON:  No priors.    FINDINGS:  Mediastinum: Heart size is normal. There is no significant  pericardial fluid, thickening or pericardial calcification. No  pathologically enlarged mediastinal or hilar lymph nodes. Please  note that accurate exclusion of hilar adenopathy is limited on  noncontrast CT scans. Esophagus is unremarkable in appearance.    Lungs/Pleura: In the medial aspect of the apex of the left upper  lobe there is a 2.5 x 2.1 cm macrolobulated nodule with some  spiculated margins and retraction of the overlying pleura, highly  suspicious for a primary bronchogenic carcinoma. There also multiple  other smaller subcentimeter pulmonary nodules scattered throughout  the lungs bilaterally. These are too numerous to count,  predominantly peribronchovascular in distribution and most prevalent  in the upper lungs. A few small calcified granulomas are also noted.  No acute consolidative airspace disease. No pleural effusions. Areas  of dependent atelectasis and/or scarring are noted in the lower  lobes of the lungs bilaterally.    Upper Abdomen: Exophytic cyst in the upper pole of the left kidney  incompletely visualized. Otherwise, unremarkable.    Musculoskeletal: There are multiple lytic lesions throughout the  visualized axial and  appendicular skeleton. These are most prevalent  throughout the upper thoracic spine, and most severe at T2 where  there is a lesion which involves anterior, middle and posterior  columns, with mild compression (approximately 20% loss of height),  and soft tissue encroachment upon the central spinal canal which  appears severely narrowed (best appreciated on image 6 of series 2).  There is also a pathologic compression fracture of T5 with  approximately the 40% loss of anterior vertebral body height and 25%  loss of posterior vertebral body height. A large lytic lesion of the  posterior aspect of the right seventh rib is also noted measuring  approximately 5.7 x 3.0 cm. Multiple smaller rib lesions are also  noted.     IMPRESSION:  1. 2.5 x 2.1 cm the left upper lobe nodule highly suspicious for a  primary bronchogenic carcinoma. No definite mediastinal or hilar  lymphadenopathy noted on today's non contrast CT examination.  Multiple lytic lesions in the bones, compatible with metastatic  disease to the skeleton. Findings suggest stage IV lung cancer.  2. There are also innumerable smaller pulmonary nodules scattered  throughout the lungs bilaterally which are predominantly upper lung  and peribronchovascular in distribution. This pattern is not  compatible with metastatic disease to the lungs, and is rather more  suspicious for atypical infection, including both tuberculosis and  nontuberculous mycobacterial organisms. Clinical correlation is  strongly recommended.  3. Amongst the multiple skeletal lesions, there is a lesion in T2  with early pathologic compression, and soft tissue encroaching upon  the central spinal canal. Clinical correlation for signs and  symptoms of of the vertebra cord compression is recommended.  These results will be called to the ordering clinician or  representative by the Radiologist Assistant, and communication  documented in the PACS or zVision  Dashboard.    Electronically Signed    By: Vinnie Langton M.D.    On: 03/06/2014 09:53         Verified By: Etheleen Mayhew, M.D.,   Relevent Results:   Relevant Scans and Labs scans are reviewed. MRI scan of his brain has been requested for my review   Assessment and Plan: Impression:   stage IV lung cancer with brain and spinal metastases in 49 year old male status  post  surgical decompression of T1-2 vertebral bodies. Plan:   iin the stomach ahead with palliative radiation therapy to his whole brain. We'll incorporate his skull metastasis as well as whole brain and cerebellum up to 3000 cGy in 12 fractions. I do what they would boost a cerebellar met 2000 cGy in 4 fractions using IM RT treatment planning and delivery with MRI fusion study. We'll also simulate the patient for palliative radiation therapy to his thoracic spine  T1-T5 inclusive treat that to 3000 cGy in 10 fractions. Hopefully we can complete his treatments simultaneously. Risks and benefits of treatment including declining cognitive function, hair loss, skin reaction, alteration of blood counts, and possibility of radiation esophagitis from his thoracic field all were explained in detail to the patient. He seems to comprehend my treatment plan well. I have set him up tomorrow for CT simulation of both whole brain and thoracic spine  and will start his treatments early next week. The patient is currently on Decadron therapy. May also  have patient evaluated again by Dr. Loni Muse in the near future for  medical oncology opinion.  I would like to take this opportunity for allowing me to participate in the care of your patient..  Electronic Signatures: Copper Basnett, Roda Shutters (MD)  (Signed 02-Sep-15 14:23)  Authored: HPI, Diagnosis, Past Hx, PFSH, Allergies, Home Meds, ROS, Nursing Notes, Physical Exam, Other Results, Relevent Results, Encounter Assessment and Plan   Last Updated: 02-Sep-15 14:23 by Armstead Peaks (MD)

## 2014-11-04 NOTE — H&P (Signed)
PATIENT NAME:  Luis [name], Luis [name] MR#:  [id number] DATE OF BIRTH:  1965/09/30  DATE OF ADMISSION:  03/05/2014  REFERRING PHYSICIAN: Dr. Harvest Dark.   PRIMARY CARE PHYSICIAN: [facility].   CHIEF COMPLAINT: Abdominal pain, urinary retention and back pain.   HISTORY OF PRESENT ILLNESS: This is a 49 year old male with known history of hypertension, presents with above-mentioned complaints. The patient reports he has been having abdominal pains over the last couple of days. Reports has been having a probable urinating, nothing since noon as well as complaints. Upon presentation, the patient had Foley catheter inserted where he did have 800 mL urine output immediately. The patient mentioned he had complaints of lower back pain for the last few months, as well as he reports that as well back as pelvic back pain and some shoulder pain as well, which has been intermittent can be migratory over the last few months, where he has been seen his PCP where he was given Flexeril and tramadol for that. The patient had leukocytosis of 15,000 on his blood work-up. The patient's CT abdomen and pelvis done because of his vomiting and abdominal pain, which did show evidence of multiple lytic lesions in the lumbar spine. The patient denies any saddle paresthesia and no stool incontinence. No lower extremity weakness or tingling or numbness. He had MRI of lumbosacral spine which did not show any evidence of cord compression but did show evidence of multiple and numerous vertebral column and pelvic lesions, who presented denies any fever, any chills, any diarrhea, any constipation. Reports loss of appetite, nausea, vomiting, but denies any weight loss or any night   sweating, any cough or diaphoresis.   The patient has significant back pain did not improve with IV morphine 1 dose, so he was given another dose on the hospitalist requested to admit to control his back pain.   PAST MEDICAL HISTORY: Hypertension.   PAST SURGICAL  HISTORY: None.   SOCIAL HISTORY: No smoking. No alcohol. No illicit drug use.   FAMILY HISTORY: The patient lives at home, works as a Architectural technologist at [employer]. He has 2 children.  One is 10 and the other is a 48-1/2-year-old sons.  Married and lives at home with his wife.   FAMILY HISTORY: Reports history significant for colon cancer in his mother.   ALLERGIES: No known drug allergies.   HOME MEDICATIONS:  1. Flexeril as needed.  2. Tramadol as needed.  3. Hydrochlorothiazide, cannot recall the dose.  4. Lisinopril, cannot recall the dose.   REVIEW OF SYSTEMS: CONSTITUTIONAL: Denies fever, chills. Reports fatigue, weakness or loss of appetite.  EYES: Denies blurry vision, double vision, inflammation.  EARS, NOSE AND THROAT: Denies ear pain, hearing loss, epistaxis or discharge.  RESPIRATORY: Denies cough, wheezing, hemoptysis, dyspnea.  CARDIOVASCULAR: Denies chest pain, edema, palpitation, syncope.  GASTROINTESTINAL: Reports abdominal pain, nausea, vomiting. Denies hematemesis, coffee-ground emesis, melena.  GENITOURINARY: Reported the problem making urine. Denies hematuria, dysuria or renal colic.  ENDOCRINE: Denies polyuria, polydipsia, heat or cold intolerance.  HEMATOLOGY: Denies anemia, easy bruising, bleeding diathesis.  INTEGUMENT: Denies acne, rash, or skin lesion.  MUSCULOSKELETAL: Complains of multiple pains in his pelvic area, shoulders and back pain.  NEUROLOGIC: Denies any CVA, transient ischemic attack, tremors, vertigo, ataxia. No lower extremity deficits, loss of sensation.  PSYCHIATRIC: Denies anxiety, insomnia, or depression.   PHYSICAL EXAMINATION:  VITAL SIGNS: Temperature 97.8, pulse 98, respiratory rate 22, blood pressure 135/93, saturating 97% on room air.  GENERAL: Well-nourished male who looks  comfortable in bed, in no apparent distress.  HEENT: Head atraumatic, normocephalic. Pupils equal, reactive to light. Pink conjunctivae. Anicteric sclerae. Moist  oral mucosa.  NECK: Supple. No thyromegaly. No JVD. No carotid bruits. Trachea is midline. No lymphadenopathy.  CHEST: Good air entry bilaterally. No wheezing, rales, or rhonchi. No use of accessory muscle. No tenderness to palpation.  CARDIOVASCULAR: S1, S2 heard. No rubs, murmur or gallops. PMI is nondisplaced.  ABDOMEN: Soft, nontender, nondistended. Bowel sounds present. No hepatosplenomegaly.   EXTREMITIES: No edema. No clubbing. No cyanosis. Pedal pulses +2 bilaterally.  PSYCHIATRIC: Appropriate affect. Awake, alert x 3. Intact judgment and insight.  NEUROLOGIC: Cranial nerves grossly intact. Motor 5/5. No focal deficits. Sensation symmetrical and intact bilaterally.  MUSCULOSKELETAL: Has tenderness to palpation in the lower back area. No CVA tenderness.  LYMPHATICS: No cervical or supraclavicular axillary lymphadenopathy.   PERTINENT LABORATORY DATA: Glucose 137. BNP 71, BUN 17, creatinine 1.04, sodium 135, potassium 3.8, chloride 96, CO2 28. Troponin less than 0.02, white blood cells 15, hemoglobin 12.7, hematocrit 39.3, platelets 400,000. Urinalysis negative for leukocyte esterase and nitrite.   IMAGING STUDIES: Metastatic disease to be visible to the visibile spine with an L2 pathologic compression fracture. No significant lumbar spinal stenosis results. There is destructive lesion over the left 10th rib with a   extension of the tumor. No other acute metastatic lesions identified in the abdomen or pelvis. MRI of lumbosacral spine with and without contrast showing diffuse osseous metastasis involving the lumbar spine and pelvis as detailed above with pathologic compression fracture of L2. No evidence of significant spinal canal stenosis or cord compression.   ASSESSMENT AND PLAN:  1. Intractable lower back pain is secondary to L2 compression fracture from metastatic osseous lesion, the patient will be admitted for intravenous p.r.n. morphine medication. 2. Urinary retention. This is most  likely related to patient's intractable back pain, he does not have any other focal deficits. No cervical paresthesias. No lower extremity weakness. No stool incontinence. His MRI is negative for cord compression or spinal stenosis. We will continue with Foley catheter and we will try to discontinue his Foley when his pain is controlled.  3. Hypertension, currently acceptable. We will resume him back on his home medications once his home medications dose is known.  4. Lytic bony lesions. We will consult oncology. We will check serum electrophoresis for now. 5. Deep thrombosis prophylaxis. Subcutaneous heparin.   CODE STATUS: Full code.   TOTAL TIME SPENT ON ADMISSION AND PATIENT CARE: 55 minutes.    ____________________________ Albertine Patricia, MD dse:JT D: 03/05/2014 06:20:35 ET T: 03/05/2014 06:43:29 ET JOB#: [id number]  cc: Albertine Patricia, MD, <Dictator> Ordean Fouts Graciela Husbands MD ELECTRONICALLY SIGNED 03/07/2014 3:31

## 2014-11-04 NOTE — Consult Note (Signed)
PATIENT NAME:  Luis [name], SCHWARTZ MR#:  [id number] DATE OF BIRTH:  1965/12/17  DATE OF CONSULTATION:  03/05/2014  REFERRING PHYSICIAN:  Albertine Patricia, MD  CONSULTING PHYSICIAN:  Logyn Kendrick R. Ma Hillock, MD  REASON FOR CONSULTATION: Newly diagnosed lytic bone lesions, possible malignancy.   HISTORY OF PRESENT ILLNESS: The patient is a 49 year old gentleman with known history of hypertension who presented to the hospital with complaints of persistent abdominal pain, urinary retention, and back pain. He had Foley catheter placed and 800 mL of urine was drained. He also had leukocytosis with WBC of 15,000. Also has had some joint pains, which he states was migratory over the last few months. He also complained of intermittent nausea and vomiting. He therefore had a CT scan of the abdomen and pelvis done earlier today, which reported abnormal bony lesions, likely metastatic disease, to the visible spine with L2 pathologic compression fracture, destructive lesion in the left tenth rib with extraosseous extension of tumor, and possibility of myeloma was also raised. 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 The patient denies any known prior history of malignancy. Appetite is good, denies any unintentional weight loss. He does have ongoing constipation for a few days, states that his mother had colon cancer in her 31s, denies other family history of malignancy.   PAST MEDICAL HISTORY AND PAST SURGICAL HISTORY: Remarkable for hypertension only.   FAMILY HISTORY: As in HPI above.   SOCIAL HISTORY: No history of smoking, alcohol, or recreational drug usage. Physically active and ambulatory; he is a Biomedical scientist.   ALLERGIES: No known drug allergies.   HOME MEDICATIONS: Flexeril p.r.n. , tramadol p.r.n., hydrochlorothiazide 1 tablet daily, lisinopril 1 tablet daily.   REVIEW OF SYSTEMS:   CONSTITUTIONAL: As in HPI. No fevers, chills, or night sweats.  HEENT: Denies any headaches, dizziness, epistaxis, ear or jaw pain. No sinus symptoms.  CARDIAC: Denies any angina, palpitation, orthopnea, or PND.  LUNGS: No new cough, dyspnea, chest pain, or hemoptysis.  GASTROINTESTINAL: As in HPI.  GENITOURINARY: No dysuria or hematuria; had urinary retention as above.  SKIN: No new rashes or pruritus.  HEMATOLOGIC: Denies obvious bleeding issues.  MUSCULOSKELETAL: As in history of present illness.  EXTREMITIES: No new swelling.  NEUROLOGIC: No new focal weakness, seizures, or loss of consciousness.  ENDOCRINE: No polyuria or polydipsia.   PHYSICAL EXAMINATION:  GENERAL: The patient is a moderately built, thin individual, resting in bed, alert and oriented and converses appropriately. No icterus. No pallor.  VITAL SIGNS: Temperature 98.5, heart rate 143, respirations 20, blood pressure 126/84, oxygen saturation 96% on room air.  HEENT: Normocephalic, atraumatic. Extraocular movements intact. Sclerae anicteric. No oral thrush.  NECK: Negative for lymphadenopathy.  CARDIOVASCULAR: S1, S2, regular rate and rhythm, tachycardic.  LUNGS: Show bilateral diminished breath sounds at bases, otherwise good air entry, no rhonchi.  ABDOMEN: Soft, nontender, no organomegaly clinically.  EXTREMITIES: Show no edema or cyanosis.  SKIN: Shows no generalized rashes or bruising.  LYMPHATICS: No adenopathy palpable in the neck, axillary, or inguinal areas.  NEUROLOGIC: Grossly nonfocal, cranial nerves intact.   LABORATORY RESULTS: Urinalysis negative for blood. WBC 15,000, hemoglobin 12.7, platelets 400,000. Creatinine 1.04, calcium 9.8.   IMPRESSION AND RECOMMENDATION: A 49 year old gentleman with a past medical history significant for hypertension only, admitted with the above symptoms, and workup including CT scan of the abdomen and pelvis and MRI of the lumbar  spine reports abnormal bone lesions,  raising concern for multiple osseous metastasis. Serum and random urine protein electrophoresis is pending at this time to look for evidence of myeloma. Findings could also be secondary to bone metastasis from other malignancy. We will await above laboratory test results. In the meantime, we will also get a CT scan of the chest (without IV contrast, since he just had CT with contrast and MRI lumbar spine with contrast, especially if he has possible myeloma) to look for any evidence of lung mass or mediastinal adenopathy. Based upon results of this workup, we will decide upon further evaluation and biopsy. The patient explained about the possibility of myeloma or other metastatic malignancy, and that further plan of management will be made after workup is completed and diagnosis is obtained. Also, given constipation and family history of colon cancer, will request gastroenterology to evaluate for colonoscopy. Patient agreeable to this plan.   Thank you for the referral, please feel free to contact me for additional questions.    ____________________________ Rhett Bannister Ma Hillock, MD srp:ST D: 03/05/2014 23:42:19 ET T: 03/06/2014 00:44:07 ET JOB#: [id number]  cc: Drayton Tieu R. Ma Hillock, MD, <Dictator> Alveta Heimlich MD ELECTRONICALLY SIGNED 03/06/2014 8:50

## 2014-11-04 NOTE — Consult Note (Signed)
PATIENT NAME:  Luis [name], HEURING MR#:  [id number] DATE OF BIRTH:  04-Jun-1966  DATE OF CONSULTATION:  03/05/2014 CONSULTING PHYSICIAN:  Arther Dames, MD  REASON FOR CONSULTATION: Abdominal pain and constipation.   HISTORY OF PRESENT ILLNESS: Luis [name] is a 49 year old [place]ese gentleman with a past medical history notable for just hypertension who is admitted to the hospital for evaluation of severe intractable back pain along with urinary retention.   During the course of working this up, it has come to the attention that Luis [name] has also been having some trouble with constipation over about the past week. Prior to the past week, Luis [name] reports that he was moving his bowels about once per day, although his stools sometimes were on the larger, slightly harder side. He did consider his bowel movements normal up until this point. It is just over the past week that he has only moved his bowels twice. In addition, he does reports some very mild diffuse abdominal tenderness, but this is not his most major concern at this time.   He does have a family history of colon cancer. His mom developed colon cancer in her early 57s and passed away from this. He is not having any rectal bleeding, any melena, diarrhea, weight loss.   It does appear, based on his imaging from the hospital here, that he does have lytic lesions in his spine and also on one of his ribs. Otherwise, his CAT scan does not show any etiology for his abdominal pain.   PAST MEDICAL HISTORY: Hypertension.   PAST SURGICAL HISTORY: None.   SOCIAL HISTORY: He is [place]. He has been in this country [place] for 10 years, but has been back to [place] several times. He denies alcohol.   FAMILY HISTORY: His mother did pass away from colon cancer in her early 18s.   ALLERGIES: NKDA.   HOME MEDICATIONS: Flexeril, tramadol, hydrochlorothiazide, lisinopril.   REVIEW OF SYSTEMS: A 10 system review is conducted, is negative except as stated in the HPI.   PHYSICAL  EXAMINATION:  VITAL SIGNS: His temperature is 98.5, pulse is 93, respirations are 20, blood pressure 126/84, pulse oximetry is 96% on room air.  GENERAL: Alert and oriented times 4.  No acute distress. Appears stated age. HEENT: Normocephalic/atraumatic. Extraocular movements are intact. Anicteric. NECK: Soft, supple. JVP appears normal. No adenopathy. CHEST: Clear to auscultation. No wheeze or crackle. Respirations unlabored. HEART: Regular. No murmur, rub, or gallop.  Normal S1 and S2. ABDOMEN: Mild diffuse tenderness. Normal active bowel sounds. Soft, no rebound or guarding. EXTREMITIES: No swelling, well perfused. SKIN: No rash or lesion. Skin color, texture, turgor normal. NEUROLOGICAL: Grossly intact. PSYCHIATRIC: Normal tone and affect. MUSCULOSKELETAL: No joint swelling or erythema.   LABORATORY DATA: His basic metabolic panel is normal except for a glucose of 137 and a sodium of 135. Troponin normal. White count 15, hemoglobin 13, hematocrit 39, platelets are 400,000. His MCV is 78.   IMAGING: He had a CT scan and MRI as mentioned above, he does have these lytic lesions in his L spine and along with his pelvis and he also has a compression fracture of the L2 vertebral body. He does not have a cord compression.  ASSESSMENT AND PLAN:  1.  Constipation: It does seem as though this, per Mr. Cantara, has only been a problem for about the past 1 week. He has also been taking some pain medicines and has been having severe pain in his back and I suspect it  is likely related to this. I do not suspect this is related to the lesions in his spine and rib. In terms of his constipation, I would recommend that we start him on daily MiraLax in addition to the docusate that he is already on. 2.  Lytic, possibly metastatic lesions: I would prefer to work this up with SPEP, UPEP, possible biopsy of these lesions. I think also tuberculosis should be a consideration. If we are unable to find an etiology for these  lesions, it would be reasonable to consider a colonoscopy and upper endoscopy. However, given his severe back pain I think a prep would be somewhat difficult at this time and therefore would prefer more targeted workup. I will consider these tests down the road.   Thank you for this consult.   ____________________________ Arther Dames, MD mr:TT D: 03/05/2014 16:37:54 ET T: 03/05/2014 17:22:36 ET JOB#: [id number]  cc: Arther Dames, MD, <Dictator> Mellody Life MD ELECTRONICALLY SIGNED 03/19/2014 17:02

## 2014-11-04 NOTE — Discharge Summary (Signed)
PATIENT NAME:  Luis [name], Luis [name] MR#:  [id number] DATE OF BIRTH:  [birth year]  DATE OF ADMISSION:  03/06/2014 DATE OF DISCHARGE:    STAT DISCHARGE SUMMARY:   REASON FOR TRANSFER: Potential progressive spinal cord compression from pathological compression fracture of the vertebrae with underlying metastatic cancer with unknown primary.   SECONDARY DIAGNOSES:  1. Hypertension.  2. Urinary retention requiring Foley catheterization.  3. Anxiety.   CONSULTATIONS:  1. Oncology, Dr. Ma Hillock.  2. Gastrointestinal, Dr. Arther Dames.  3. Pulmonary, Dr. Flora Lipps.   PROCEDURES AND RADIOLOGY: Chest x-ray on the 22nd of August showed no active cardiopulmonary disease.   MRI of the lumbar spine with and without contrast on the 23rd of August showed diffuse osseous metastases involving the lumbar spine and pelvis, pathological compression fracture of the L2 vertebral body with associated 50% of height loss without significant retropulsion. No evidence of significant spinal canal stenosis or cord compression within the lumbar spine.   CT scan of the abdomen and pelvis with contrast on the 23rd of August showed metastatic disease to the visible spine with L2 pathological compression fracture. Destructive lesion of the left tenth rib with extraosseous extension of the tumor. Possible multiple myeloma. No other acute or metastatic process.   CT scan of the chest without contrast on the 24th of August showed 2.5 x 2.1-cm left upper lobe nodule highly suspicious for primary bronchogenic carcinoma. Multiple lytic lesions in the bone compatible with metastatic disease to the skeleton. 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  Innumerable smaller pulmonary nodules scattered throughout the lungs bilaterally predominantly in the upper lobe and b.i.d. bronchovascular distribution. Lesion in T2 with early pathological compression. Soft tissue encroaching upon the spinal canal.   MAJOR LABORATORY PANEL: UA on admission was  negative.   HISTORY AND SHORT HOSPITAL COURSE: The patient is a 49 year old male with no significant medical problems, was admitted for abdominal pain, urinary retention, and back pain. He was found to have metastatic osseous lesion with some pathological compression fracture of the vertebra. Please see Dr. Graciela Husbands dictated history and physical for further details. GI consultation was obtained with Dr. Arther Dames concerning abdominal pain and constipation. He recommended bowel regimen to relieve his constipation and workup for metastatic lesion including oncology consultation which was obtained with Dr. Ma Hillock who recommended CT of the chest, abdomen, and pelvis for further evaluation including MR of the lumbar spine with contrast to evaluate for multiple myeloma and other bony pathology. CT of the chest showed lung nodule, worrisome for primary bronchogenic carcinoma for which pulmonary consultation was obtained with Dr. Flora Lipps who evaluated the patient and based on his overall presentation and scans was concerned about possible rhabdomyosarcoma/osteosarcoma with metastases or primary lung cancer with metastasis. This morning the patient started having some numbness feeling in the lower extremities, and as the day progressed he started having more and more difficulty walking and difficulty urinating. At some point his catheter this afternoon was removed to see if he would be able to urinate but he failed and Foley catheter was placed back. The patient seemed to be having symptoms consistent with cord compression, mainly on thoracic vertebrae, especially considering his scan showing T2 early pathological compression and soft tissue encroaching upon the spinal canal. This will need to be treated very aggressively. This was discussed with oncology, Dr. Ma Hillock, who agrees with the same and recommends transfer to neurosurgery. The case has been discussed with Dr. Ellene Route at [facility] who has accepted  to  evaluate at [facility] and at this time we are waiting to have a [facility] to accept the patient at [facility]. The call has been placed at [facility] As soon as [facility]ist accepts the patient, the patient will be transferred to [facility] under [facility] care with consultation with [facility] Dr. Ellene Route. The patient has been given IV Decadron 40 mg stat.   Per Dr Ma Hillock [facility]) Patient has multiple atypical lung nodules and is from [place] where he states TB is prevalent, have requested PPD.  Serum and random urine protein electrophoresis is pending at this time to look for evidence of myeloma. Patient has symptoms consistent with progressive spinal cord compression, so will need tertiary care transfer ASAP.  TOTAL TIME TAKING CARE OF THIS PATIENT (CRITICAL CARE): 40 minutes.   ____________________________ Lucina Mellow. Manuella Ghazi, MD vss:lt D: 03/06/2014 18:43:42 ET T: 03/06/2014 19:44:09 ET JOB#: [id number]  cc: Rhythm Gubbels S. Manuella Ghazi, MD, <Dictator> Earleen Newport, MD [facility] R. Ma Hillock, MD Arther Dames, MD Mariane Duval, MD Lucina Mellow [facility] MD ELECTRONICALLY SIGNED 03/09/2014 10:58

## 2014-11-04 NOTE — Consult Note (Signed)
ONCOLOGY followup note -CT scan of the chest showed left upper lung mass, rib metastasis (large right rib lesion), multiple vertebral metastasis especially at T2 level with soft tissue extension encroaching the spinal canal. Discussed with radiologist Dr.Register and requested CT-guided right rib bx for tomorrow. However, upon seeing patient to discuss the CT scan findings and above plan, he now complains of progressive weakness in lower extremities and ascending numbness. Intermittent upper back pain.no fever or chills. Denies bleeding symptoms.patient lying in bed, alert and oriented  converses appropriately. No acute distress.          vitals - 98.7, 111, 20, 143/101, 96% on room air          lungs - bilaterally good air entry          abd - soft, NT          neuro - limited exam, cranial nerves intact. Motor weakness b/l LE 3-4/5. Gait not checked. WBC 11300, ANC 8100, Hb 11.7, platelets 264, creatinine 1.06, calcium 8.8.  SIEP and 24-hour UIEP are pending.  CT chest today - IMPRESSION:  1. 2.5 x 2.1 cm the left upper lobe nodule highly suspicious for a primary bronchogenic carcinoma. No definite mediastinal or hilarnoted on today's non contrast CT examination. Multiple lytic lesions in the bones, compatible with metastatic disease to the skeleton. Findings suggest stage IV lung cancer. 2. There are also innumerable smaller pulmonary nodules scattered throughout the lungs bilaterally which are predominantly upper lungperibronchovascular in distribution. This pattern is not compatible with metastatic disease to the lungs, and is rather more suspicious for atypical infection, including both tuberculosis and nontuberculous mycobacterial organisms. Clinical correlation is strongly recommended. 3. Amongst the multiple skeletal lesions, there is a lesion in T2 with early pathologic compression, and soft tissue encroaching upon the central spinal canal. Clinical correlation for signs and symptoms of of the  vertebra cord compression is recommended.  49 year old gentleman with a past medical history significant for hypertension only, admitted with the above symptoms, and workup so far shows multiple bone lesions, left upper lung mass raising concern for metastatic malignancy. Patient also has multiple atypical lung nodules and is from [place] where he states TB is prevalent, have requested PPD.  Serum and random urine protein electrophoresis is pending at this time to look for evidence of myeloma. Patient has symptoms consistent with progressive spinal cord compression, have d/w Neurosurgeon on-call Dr.Elsner at [facility] who recommends transfer there to see if he would benefit from surgery. Will give Decadron 40 mg IV stat, have discussed with hospitalist who will arrange transfer ASAP. The patient was explained about the possibility of metastatic malignancy versus other etiology and that overall prognosis is poor if this turns out to be an aggressive malignancy. Patient and wife present were explained above details, they are agreeable to this plan.     Electronic Signatures: Jonn Shingles (MD)  (Signed on 24-Aug-15 19:17)  Authored  Last Updated: 24-Aug-15 19:17 by Jonn Shingles (MD)

## 2014-11-08 ENCOUNTER — Other Ambulatory Visit: Payer: Self-pay | Admitting: Internal Medicine

## 2014-11-08 DIAGNOSIS — C3490 Malignant neoplasm of unspecified part of unspecified bronchus or lung: Secondary | ICD-10-CM

## 2014-11-08 LAB — CBC CANCER CENTER
BASOS PCT: 0.6 %
Basophil #: 0 x10 3/mm (ref 0.0–0.1)
EOS ABS: 0.2 x10 3/mm (ref 0.0–0.7)
Eosinophil %: 2.9 %
HCT: 44.8 % (ref 40.0–52.0)
HGB: 14.4 g/dL (ref 13.0–18.0)
LYMPHS PCT: 28.3 %
Lymphocyte #: 2 x10 3/mm (ref 1.0–3.6)
MCH: 25.8 pg — ABNORMAL LOW (ref 26.0–34.0)
MCHC: 32.1 g/dL (ref 32.0–36.0)
MCV: 81 fL (ref 80–100)
Monocyte #: 0.7 x10 3/mm (ref 0.2–1.0)
Monocyte %: 10.6 %
NEUTROS PCT: 57.6 %
Neutrophil #: 4 x10 3/mm (ref 1.4–6.5)
Platelet: 242 x10 3/mm (ref 150–440)
RBC: 5.57 10*6/uL (ref 4.40–5.90)
RDW: 14.5 % (ref 11.5–14.5)
WBC: 6.9 x10 3/mm (ref 3.8–10.6)

## 2014-11-08 LAB — COMPREHENSIVE METABOLIC PANEL
ALK PHOS: 62 U/L
ALT: 53 U/L
ANION GAP: 6 — AB (ref 7–16)
Albumin: 4 g/dL
BUN: 13 mg/dL
Bilirubin,Total: 0.6 mg/dL
CHLORIDE: 106 mmol/L
CO2: 29 mmol/L
Calcium, Total: 9.1 mg/dL
Creatinine: 1.15 mg/dL
EGFR (African American): >60
EGFR (Non-African Amer.): >60
GLUCOSE: 75 mg/dL
Potassium: 3.7 mmol/L
SGOT(AST): 31 U/L
Sodium: 141 mmol/L
Total Protein: 7.7 g/dL

## 2014-11-08 LAB — PHOSPHORUS: PHOSPHORUS: 3.2 mg/dL

## 2014-11-08 LAB — MAGNESIUM: MAGNESIUM: 1.9 mg/dL

## 2014-12-05 ENCOUNTER — Other Ambulatory Visit: Payer: Self-pay | Admitting: *Deleted

## 2014-12-05 ENCOUNTER — Encounter: Payer: Self-pay | Admitting: *Deleted

## 2014-12-05 DIAGNOSIS — C3492 Malignant neoplasm of unspecified part of left bronchus or lung: Secondary | ICD-10-CM

## 2014-12-05 DIAGNOSIS — C3490 Malignant neoplasm of unspecified part of unspecified bronchus or lung: Secondary | ICD-10-CM

## 2014-12-05 HISTORY — DX: Malignant neoplasm of unspecified part of unspecified bronchus or lung: C34.90

## 2014-12-06 ENCOUNTER — Other Ambulatory Visit: Payer: Self-pay

## 2014-12-06 ENCOUNTER — Ambulatory Visit: Payer: Self-pay

## 2014-12-18 ENCOUNTER — Other Ambulatory Visit: Payer: Self-pay | Admitting: *Deleted

## 2014-12-18 NOTE — Progress Notes (Signed)
Got a fax from [facility] stating that they called for delivery  Set up and pt mentioned having diarrhes, n/v, loss of appetite, and rash.  I called pt and spoke to him and he states it has been the same for a while. He does not have diarrhea every day but lots of days and knows to take imodium but does not want to take it. Has nausea a lot and vomits 1 x week and has nausea med. But does not want to take it. Has rash all over that normally does not bother him but has cleocin gel and uses it as needed.  He is tolerable of the side effects and says it is minimal and he can tolerate and does not take the as needed med. He has for these side effects and wants to cont. Taking med and he is ok.  Asked about massage and I told him he can call sch. Or I can get him an appt and he will just call and make one when he wants it.

## 2014-12-21 ENCOUNTER — Telehealth: Payer: Self-pay | Admitting: *Deleted

## 2014-12-21 NOTE — Telephone Encounter (Signed)
Pt having increased diarrhea since yesterday but has not taken imodium at this time. Per Judeen Hammans, pt needs to take 1 imodium after each loose stool and my take up to 8 tablets per day. Instructions given to patient and patient instructed to call back tomorrow morning if not any better. Informed pt that imodium is available to pharmacy over the counter. Pt verbalized understanding.

## 2015-01-02 ENCOUNTER — Other Ambulatory Visit: Payer: Self-pay

## 2015-01-03 ENCOUNTER — Inpatient Hospital Stay: Payer: BLUE CROSS/BLUE SHIELD | Attending: Internal Medicine

## 2015-01-03 ENCOUNTER — Ambulatory Visit: Payer: Self-pay

## 2015-01-03 ENCOUNTER — Other Ambulatory Visit: Payer: Self-pay

## 2015-01-03 ENCOUNTER — Inpatient Hospital Stay: Payer: BLUE CROSS/BLUE SHIELD

## 2015-01-24 ENCOUNTER — Telehealth: Payer: Self-pay | Admitting: *Deleted

## 2015-01-24 NOTE — Telephone Encounter (Signed)
Returned call to patient, advised him of Dr Pandit's recommendations. He does not have a dentist and had me to spel Naprosyn for him. He said he will try Naprosyn first, I told him no, he needs to do both. He said he will find a dentist

## 2015-01-24 NOTE — Telephone Encounter (Signed)
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 He needs to go see his dentist or oral surgeon, he can try Naprosyn OTC in the meantime. If he does not see dentist/oral surgeon, we need to know since we will need to get Xray of the jaw bone to r/o osteonecrosis (since he is on Xgeva). Thanks.

## 2015-01-31 ENCOUNTER — Inpatient Hospital Stay (HOSPITAL_BASED_OUTPATIENT_CLINIC_OR_DEPARTMENT_OTHER): Payer: BLUE CROSS/BLUE SHIELD | Admitting: Internal Medicine

## 2015-01-31 ENCOUNTER — Inpatient Hospital Stay: Payer: BLUE CROSS/BLUE SHIELD

## 2015-01-31 ENCOUNTER — Other Ambulatory Visit: Payer: Self-pay | Admitting: Internal Medicine

## 2015-01-31 ENCOUNTER — Inpatient Hospital Stay: Payer: BLUE CROSS/BLUE SHIELD | Attending: Internal Medicine

## 2015-01-31 ENCOUNTER — Ambulatory Visit
Admission: RE | Admit: 2015-01-31 | Discharge: 2015-01-31 | Disposition: A | Discharge status: Home / Self Care | Payer: Medicaid Other | Source: Ambulatory Visit | Attending: Internal Medicine | Admitting: Internal Medicine

## 2015-01-31 VITALS — BP 131/92 | HR 82 | Temp 96.3°F | Resp 18 | Ht 67.0 in | Wt 141.8 lb

## 2015-01-31 DIAGNOSIS — C3412 Malignant neoplasm of upper lobe, left bronchus or lung: Secondary | ICD-10-CM

## 2015-01-31 DIAGNOSIS — F458 Other somatoform disorders: Secondary | ICD-10-CM

## 2015-01-31 DIAGNOSIS — C7931 Secondary malignant neoplasm of brain: Secondary | ICD-10-CM

## 2015-01-31 DIAGNOSIS — R6884 Jaw pain: Secondary | ICD-10-CM | POA: Insufficient documentation

## 2015-01-31 DIAGNOSIS — Z923 Personal history of irradiation: Secondary | ICD-10-CM | POA: Diagnosis not present

## 2015-01-31 DIAGNOSIS — C7951 Secondary malignant neoplasm of bone: Secondary | ICD-10-CM | POA: Insufficient documentation

## 2015-01-31 DIAGNOSIS — Z79899 Other long term (current) drug therapy: Secondary | ICD-10-CM

## 2015-01-31 DIAGNOSIS — R21 Rash and other nonspecific skin eruption: Secondary | ICD-10-CM | POA: Diagnosis not present

## 2015-01-31 DIAGNOSIS — R911 Solitary pulmonary nodule: Secondary | ICD-10-CM | POA: Diagnosis not present

## 2015-01-31 DIAGNOSIS — C3492 Malignant neoplasm of unspecified part of left bronchus or lung: Secondary | ICD-10-CM

## 2015-01-31 DIAGNOSIS — N281 Cyst of kidney, acquired: Secondary | ICD-10-CM | POA: Insufficient documentation

## 2015-01-31 DIAGNOSIS — K219 Gastro-esophageal reflux disease without esophagitis: Secondary | ICD-10-CM

## 2015-01-31 DIAGNOSIS — C3490 Malignant neoplasm of unspecified part of unspecified bronchus or lung: Secondary | ICD-10-CM

## 2015-01-31 DIAGNOSIS — I10 Essential (primary) hypertension: Secondary | ICD-10-CM | POA: Diagnosis not present

## 2015-01-31 LAB — CBC WITH DIFFERENTIAL/PLATELET
BASOS PCT: 1 %
Basophils Absolute: 0 10*3/uL (ref 0–0.1)
EOS ABS: 0.3 10*3/uL (ref 0–0.7)
Eosinophils Relative: 4 %
HCT: 42.7 % (ref 40.0–52.0)
Hemoglobin: 13.5 g/dL (ref 13.0–18.0)
Lymphocytes Relative: 30 %
Lymphs Abs: 1.9 10*3/uL (ref 1.0–3.6)
MCH: 25.3 pg — AB (ref 26.0–34.0)
MCHC: 31.7 g/dL — ABNORMAL LOW (ref 32.0–36.0)
MCV: 79.7 fL — ABNORMAL LOW (ref 80.0–100.0)
MONO ABS: 0.5 10*3/uL (ref 0.2–1.0)
Monocytes Relative: 7 %
NEUTROS ABS: 3.8 10*3/uL (ref 1.4–6.5)
Neutrophils Relative %: 58 %
Platelets: 275 10*3/uL (ref 150–440)
RBC: 5.35 MIL/uL (ref 4.40–5.90)
RDW: 14.3 % (ref 11.5–14.5)
WBC: 6.5 10*3/uL (ref 3.8–10.6)

## 2015-01-31 LAB — PHOSPHORUS: Phosphorus: 3.4 mg/dL (ref 2.5–4.6)

## 2015-01-31 LAB — COMPREHENSIVE METABOLIC PANEL
ALT: 40 U/L (ref 17–63)
AST: 32 U/L (ref 15–41)
Albumin: 3.7 g/dL (ref 3.5–5.0)
Alkaline Phosphatase: 77 U/L (ref 38–126)
Anion gap: 7 (ref 5–15)
BILIRUBIN TOTAL: 0.5 mg/dL (ref 0.3–1.2)
BUN: 13 mg/dL (ref 6–20)
CALCIUM: 8.7 mg/dL — AB (ref 8.9–10.3)
CO2: 23 mmol/L (ref 22–32)
Chloride: 105 mmol/L (ref 101–111)
Creatinine, Ser: 1.04 mg/dL (ref 0.61–1.24)
GFR calc Af Amer: >60 mL/min (ref >60)
GFR calc non Af Amer: >60 mL/min (ref >60)
Glucose, Bld: 124 mg/dL — ABNORMAL HIGH (ref 65–99)
Potassium: 3.5 mmol/L (ref 3.5–5.1)
Sodium: 135 mmol/L (ref 135–145)
TOTAL PROTEIN: 8 g/dL (ref 6.5–8.1)

## 2015-01-31 LAB — MAGNESIUM: Magnesium: 1.8 mg/dL (ref 1.7–2.4)

## 2015-01-31 MED ORDER — IOHEXOL 300 MG/ML  SOLN
75.0000 mL | Freq: Once | INTRAMUSCULAR | Status: AC | PRN
  Administered 2015-01-31: 75 mL via INTRAVENOUS

## 2015-01-31 MED ORDER — DENOSUMAB 120 MG/1.7ML ~~LOC~~ SOLN
120.0000 mg | Freq: Once | SUBCUTANEOUS | Status: AC
  Administered 2015-01-31: 120 mg via SUBCUTANEOUS
  Filled 2015-01-31: qty 1.7

## 2015-01-31 NOTE — Progress Notes (Signed)
Patient states that he has been having pain in his jaw. He saw dentist yesterday and they said that he is grinding his teeth. He said that they are going to do a dental cleaning and get him a mouth guard to wear at night. Patient states that he has some pain in his jaw and upper back today. He also states that he has been having some acid reflux and has been taking tums which does help.

## 2015-02-01 ENCOUNTER — Telehealth: Payer: Self-pay | Admitting: *Deleted

## 2015-02-01 NOTE — Telephone Encounter (Signed)
Per dr Ma Hillock the ct report shows stable disease and we will continue same plan with monitoring pt on treatment.  I called to let pt know he will cont. On iressa and his scan was stable. Pt agreeable to cont. Same regimen.

## 2015-02-13 NOTE — Progress Notes (Signed)
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 Getting palliative radiation.  [facility] Molecular Testing 03/08/14:   Genomic Alterations identified are:  EGFR L858R,  BRAF amplification equivocal,  BTDH7C/B loss,  UL84 splice site [id number]. No Reportable Alterations identified for:  ALK, [name], ERBB2, KRAS, MET.  Started on oral Iressa (Gefitinib) ~ 05/21/14.   HISTORY OF PRESENT ILLNESS:  Patient returns for continued oncology follow-up, he was seen few months ago. He is on targeted therapy with Iressa since November. States that he has intermittent fatigue and mouth soreness but is better today. States chronic back symptoms are fluctuating but overall  feels he is doing better. He has had skin rash which has responded to Cleocin gel and other skin care. States that his appetite is good on some days and declines on other days but overall tries to eat regular meals and increased oral fluid intake. He had near paraplegia in August 2015, this is improving and he is able to ambulate slowly without use of cane or walker, feels that the strength in his legs is continuing to improve very slowly. Denies any new fecal or urinary incontinence or retention. Has mild dyspnea on exertion and cough, dry, no sputum, hemoptysis, or chest pain. Denies any headaches or other neurological symptoms. No other pain issues, 0/10.     REVIEW OF SYSTEMS:   ROS As in HPI above. In addition, no fever, chills or sweats. No new headaches or focal weakness.  No new mood disturbances. No  sore throat or dysphagia. No abdominal pain, constipation, diarrhea,  dysuria or hematuria. No new skin rash or bleeding symptoms. No new paresthesias in extremities. PS ECOG 1.  PAST MEDICAL HISTORY: Reviewed. Past Medical History  Diagnosis Date  . HTN (hypertension)   . Adenocarcinoma of lung 12/05/2014          Hypertension  Stage IV adenocarcinoma lung with bone and brain metastasis as well as thoracic spine metastasis needing T1-T2 decompression surgery               at [facility] August 2015.  PAST SURGICAL HISTORY: Reviewed. Past Surgical History  Procedure Laterality Date  . Laminectomy N/A 03/07/2014    Procedure: T1-T2 Laminectomy with Decompression of Cord and Removal of Cancer  thoracic one/two;  Surgeon: Kristeen Miss, MD;  Location: [place] [facility];  Service: Neurosurgery;  Laterality: N/A;    FAMILY HISTORY: Reviewed. Family History  Problem Relation Age of Onset  . Cancer Mother   . Heart disease Father   . Hypertension Sister   . Hypertension Brother   States that his mother had colon cancer in her 34s, denies other malignancy.  SOCIAL HISTORY: Reviewed. History  Substance Use Topics  . Smoking status: Never Smoker   . Smokeless tobacco: Not on file  . Alcohol Use: 0.5 oz/week    1 drink(s) per week  No history of smoking, alcohol, or recreational drug usage. He was a Biomedical scientist.  No Known Allergies  Current Outpatient Prescriptions  Medication Sig Dispense Refill  . dexamethasone (DECADRON) 2 MG tablet Take 1 tablet (2 mg total) by mouth 3 (three) times daily. 90 tablet 4  . hydrochlorothiazide (  HYDRODIURIL) 25 MG tablet Take 25 mg by mouth daily.    Marland Kitchen HYDROcodone-acetaminophen (NORCO/VICODIN) 5-325 MG per tablet Take 1-2 tablets by mouth every 4 (four) hours as needed for moderate pain or severe pain (mild pain). 90 tablet 0  . hydrocortisone valerate ointment (WEST-CORT) 0.2 % Apply 1 application topically 2 (two) times daily. Applied to affected area twice daily    . lisinopril (PRINIVIL,ZESTRIL) 40 MG tablet Take 40 mg by mouth  daily.    . methocarbamol (ROBAXIN) 500 MG tablet Take 1 tablet (500 mg total) by mouth every 6 (six) hours as needed for muscle spasms. 90 tablet 3  . metoprolol succinate (TOPROL-XL) 50 MG 24 hr tablet Take 50 mg by mouth at bedtime.    . ondansetron (ZOFRAN ODT) 4 MG disintegrating tablet Take 1 tablet (4 mg total) by mouth every 8 (eight) hours as needed for nausea or vomiting. 60 tablet 1  . senna-docusate (SENOKOT S) 8.6-50 MG per tablet Take 1-2 tablets by mouth at bedtime. For constipation 60 tablet 4  . traMADol (ULTRAM) 50 MG tablet Take 1 tablet (50 mg total) by mouth every 8 (eight) hours as needed for moderate pain. For pain 60 tablet 0   No current facility-administered medications for this visit.    PHYSICAL EXAM: Filed Vitals:   01/31/15 1442  BP: 131/92  Pulse:   Temp:   Resp:      Body mass index is 22.2 kg/(m^2).    ECOG FS:1 - Symptomatic but completely ambulatory  GENERAL: Patient is alert and oriented and in no acute distress. There is no icterus. HEENT: EOMs intact. Oral exam negative for thrush or lesions. No cervical lymphadenopathy. CVS: S1S2, regular LUNGS: Bilaterally good air entry, no rhonchi. ABDOMEN: Soft, nontender. No hepatomegaly clinically.  NEURO: grossly nonfocal, cranial nerves are intact. Gait slow EXTREMITIES: No pedal edema.   LAB RESULTS:    Component Value Date/Time   NA 135 01/31/2015 1350   NA 141 11/08/2014 1505   K 3.5 01/31/2015 1350   K 3.7 11/08/2014 1505   CL 105 01/31/2015 1350   CL 106 11/08/2014 1505   CO2 23 01/31/2015 1350   CO2 29 11/08/2014 1505   GLUCOSE 124* 01/31/2015 1350   GLUCOSE 75 11/08/2014 1505   BUN 13 01/31/2015 1350   BUN 13 11/08/2014 1505   CREATININE 1.04 01/31/2015 1350   CREATININE 1.15 11/08/2014 1505   CALCIUM 8.7* 01/31/2015 1350   CALCIUM 9.1 11/08/2014 1505   PROT 8.0 01/31/2015 1350   PROT 7.7 11/08/2014 1505   ALBUMIN 3.7 01/31/2015 1350   ALBUMIN 4.0 11/08/2014 1505   AST 32  01/31/2015 1350   AST 31 11/08/2014 1505   ALT 40 01/31/2015 1350   ALT 53 11/08/2014 1505   ALKPHOS 77 01/31/2015 1350   ALKPHOS 62 11/08/2014 1505   BILITOT 0.5 01/31/2015 1350   BILITOT 0.6 11/08/2014 1505   GFRNONAA >60 01/31/2015 1350   GFRNONAA >60 11/08/2014 1505   GFRAA >60 01/31/2015 1350   GFRAA >60 11/08/2014 1505    Lab Results  Component Value Date   WBC 6.5 01/31/2015   NEUTROABS 3.8 01/31/2015   HGB 13.5 01/31/2015   HCT 42.7 01/31/2015   MCV 79.7* 01/31/2015   PLT 275 01/31/2015    STUDIES: m the left upper lobe nodule highly suspicious for a primary bronchogenic carcinoma. No definite mediastinal or hilar lymphadenopathy noted on today's non contrast CT examination. Multiple lytic lesions in the bones, compatible with metastatic  disease to the skeleton. Findings suggest stage IV lung cancer.  2. There are also innumerable smaller pulmonary nodules scattered throughout the lungs bilaterally which are predominantly upper lung and peribronchovascular in distribution. 3. Amongst the multiple skeletal lesions, there is a lesion in T2 with early pathologic compression, and soft tissue encroaching upon the central spinal canal.  03/05/14 - MRI L-spine. IMPRESSION: 1. Diffuse osseous metastases involving the lumbar spine and pelvis as detailed above.  2. Pathologic compression fracture of the L2 vertebral body with associated 50% of height loss without significant retropulsion. 3. No evidence of significant spinal canal stenosis or cord compression within the lumbar spine.  08/01/14 - CT chest. IMPRESSION: 1. Interval response to therapy. 2. Decrease in size of left upper lobe lung lesion. Multiple tiny nodules identified in both lungs have also improved in the interval. 3. Interval sclerosis of previously noted lytic bone metastasis consistent with treated bone lesions.  11/08/14 - CT chest with contrast.  IMPRESSION:  1. Stable left upper lobe irregular nodule. 2. Stable  reticular nodular pattern in the right upper lobe appears postinflammatory. 3. Stable skeletal metastasis within the thoracic spine and ribs. 4. No evidence of disease progression.   Ct Chest W Contrast  01/31/2015   CLINICAL DATA:  Metastatic lung cancer. Diagnosed 1 year ago with brain metastasis and bone metastasis. History of radiation therapy.  EXAM: CT CHEST WITH CONTRAST  TECHNIQUE: Multidetector CT imaging of the chest was performed during intravenous contrast administration.  CONTRAST:  63m OMNIPAQUE IOHEXOL 300 MG/ML  SOLN  COMPARISON:  11/08/2014 and 08/01/2014.  FINDINGS: Mediastinum/Nodes: No supraclavicular adenopathy. Normal heart size, without pericardial effusion. No central pulmonary embolism, on this non-dedicated study. No mediastinal or hilar adenopathy.  Lungs/Pleura: No pleural fluid. Scattered tiny pulmonary nodules on the order of 3 mm or less. Majority were present on the prior exam. Some appear somewhat more distinct today. For example, right upper lobe at 3 mm on image 21 of series 3. Right middle lobe at 3-4 mm on image 36 of series 3.  Posterior right upper lobe clustered nodules with interstitial thickening, similar. Example image 15.  Left upper lobe pulmonary nodule measures 2.4 x 1.5 cm on image 14 of series 3. 2.2 x 1.5 cm at the same level on the prior exam. On coronal image 62 today, measures 1.7 cm craniocaudal versus 1.6 cm on the prior exam (when remeasured).  Upper abdomen: Normal imaged portions of the liver, spleen, stomach, pancreas, gallbladder, biliary tract, adrenal glands, right kidney. Upper pole left renal cyst of 3.6 cm.  Musculoskeletal: Widespread sclerotic osseous metastasis. Dominant right sixth posterior rib expansile lesion may be minimally increased at 3.3 cm maximally today versus 3.1 cm on the prior. Compression deformities at T2 and T5 are severe and chronic. Ventral canal encroachment at both levels. Mild superior endplate compression deformity at  T12 is similar. There is an incompletely imaged L2 compression deformity.  IMPRESSION: 1. Similar to minimal enlargement of left apical pulmonary nodule. 2. Right upper lobe reticular nodular opacity is similar and likely post infectious or inflammatory. However, scattered tiny pulmonary nodules are again identified. Some are slightly more conspicuous today. This could be due to differences in slice selection or mild progression of pulmonary metastasis. Recommend attention on follow-up. 3. Widespread sclerotic osseous metastasis, felt to be stable. Similar Complicating compression deformities involving the thoracolumbar spine.   Electronically Signed   By: KAbigail MiyamotoM.D.   On: 01/31/2015 13:45     ASSESSMENT /  PLAN:   1. 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 [facility] Molecular Testing 03/08/14: Genomic Alterations identified are: EGFR L858R, BRAF amplification equivocal, RCBU3A/G loss, TX64 splice site [id number]. No Reportable Alterations identified for:  ALK, [name], ERBB2, KRAS, MET. Started on oral Iressa ~ 05/21/14  -  have independently reviewed CT scan of the chest done earlier today and d/w patient/family, and explained that CT scan at this time is showing stable disease (Similar to minimal enlargement of left apical pulmonary nodule. Right upper lobe reticular nodular opacity is similar and likely post infectious or inflammatory. However, scattered tiny pulmonary nodules are again identified. Some are slightly more conspicuous today. This could be due to differences in slice selection or mild progression of pulmonary metastasis. Recommend attention on follow-up. Widespread sclerotic osseous metastasis, felt to be stable). Patient is getting clinical benefit from ongoing palliative targeted therapy with Iressa and plan is to continue on same treatment with  Iressa 250 mg orally once daily and monitor for continued side effects. Will monitor CBC, metabolic panel and electrolytes once every 4 weeks, next MD followup at 12 weeks with repeat labs and then plan next CT scan to assess continued response to treatment.  2. Skin rash - doing better. Continue skin care, Cleocin gel.  3. Hypokalemia - improved. Continue to monitor and supplement accordingly.   4. Bone metastases - on denosumab (Xgeva) 120 mg SQ injection once every 4 weeks, will monitor calcium prior to each dose.  5. In between visits, patient advised to call or come to ER in case of any worsening symptoms or acute sickness. He is agreeable to this plan.     Leia Alf, MD   02/13/2015 9:14 PM

## 2015-02-28 ENCOUNTER — Inpatient Hospital Stay: Payer: BLUE CROSS/BLUE SHIELD

## 2015-02-28 ENCOUNTER — Inpatient Hospital Stay: Payer: BLUE CROSS/BLUE SHIELD | Attending: Internal Medicine

## 2015-02-28 VITALS — BP 125/87 | HR 73 | Temp 97.3°F | Resp 16

## 2015-02-28 DIAGNOSIS — C3412 Malignant neoplasm of upper lobe, left bronchus or lung: Secondary | ICD-10-CM | POA: Diagnosis not present

## 2015-02-28 DIAGNOSIS — C3492 Malignant neoplasm of unspecified part of left bronchus or lung: Secondary | ICD-10-CM

## 2015-02-28 DIAGNOSIS — C7951 Secondary malignant neoplasm of bone: Secondary | ICD-10-CM | POA: Insufficient documentation

## 2015-02-28 DIAGNOSIS — C7931 Secondary malignant neoplasm of brain: Secondary | ICD-10-CM | POA: Diagnosis not present

## 2015-02-28 LAB — CBC WITH DIFFERENTIAL/PLATELET
Basophils Absolute: 0 10*3/uL (ref 0–0.1)
Basophils Relative: 1 %
Eosinophils Absolute: 0.2 10*3/uL (ref 0–0.7)
Eosinophils Relative: 3 %
HEMATOCRIT: 42.2 % (ref 40.0–52.0)
Hemoglobin: 13.8 g/dL (ref 13.0–18.0)
Lymphocytes Relative: 30 %
Lymphs Abs: 2.1 10*3/uL (ref 1.0–3.6)
MCH: 25.3 pg — ABNORMAL LOW (ref 26.0–34.0)
MCHC: 32.8 g/dL (ref 32.0–36.0)
MCV: 77.2 fL — ABNORMAL LOW (ref 80.0–100.0)
MONOS PCT: 7 %
Monocytes Absolute: 0.5 10*3/uL (ref 0.2–1.0)
NEUTROS ABS: 4.3 10*3/uL (ref 1.4–6.5)
NEUTROS PCT: 59 %
Platelets: 276 10*3/uL (ref 150–440)
RBC: 5.46 MIL/uL (ref 4.40–5.90)
RDW: 14.7 % — ABNORMAL HIGH (ref 11.5–14.5)
WBC: 7.1 10*3/uL (ref 3.8–10.6)

## 2015-02-28 LAB — HEPATIC FUNCTION PANEL
ALK PHOS: 79 U/L (ref 38–126)
ALT: 64 U/L — ABNORMAL HIGH (ref 17–63)
AST: 46 U/L — AB (ref 15–41)
Albumin: 3.9 g/dL (ref 3.5–5.0)
Bilirubin, Direct: <0.1 mg/dL — ABNORMAL LOW (ref 0.1–0.5)
TOTAL PROTEIN: 8 g/dL (ref 6.5–8.1)
Total Bilirubin: 0.5 mg/dL (ref 0.3–1.2)

## 2015-02-28 LAB — BASIC METABOLIC PANEL
ANION GAP: 6 (ref 5–15)
BUN: 12 mg/dL (ref 6–20)
CO2: 25 mmol/L (ref 22–32)
Calcium: 8.3 mg/dL — ABNORMAL LOW (ref 8.9–10.3)
Chloride: 104 mmol/L (ref 101–111)
Creatinine, Ser: 1.01 mg/dL (ref 0.61–1.24)
GFR calc Af Amer: >60 mL/min (ref >60)
GFR calc non Af Amer: >60 mL/min (ref >60)
Glucose, Bld: 133 mg/dL — ABNORMAL HIGH (ref 65–99)
POTASSIUM: 3.2 mmol/L — AB (ref 3.5–5.1)
Sodium: 135 mmol/L (ref 135–145)

## 2015-02-28 LAB — PHOSPHORUS: Phosphorus: 3 mg/dL (ref 2.5–4.6)

## 2015-02-28 LAB — MAGNESIUM: Magnesium: 1.8 mg/dL (ref 1.7–2.4)

## 2015-02-28 MED ORDER — DENOSUMAB 120 MG/1.7ML ~~LOC~~ SOLN
120.0000 mg | Freq: Once | SUBCUTANEOUS | Status: AC
  Administered 2015-02-28: 120 mg via SUBCUTANEOUS
  Filled 2015-02-28: qty 1.7

## 2015-03-01 ENCOUNTER — Other Ambulatory Visit: Payer: Self-pay | Admitting: Internal Medicine

## 2015-03-01 DIAGNOSIS — C3492 Malignant neoplasm of unspecified part of left bronchus or lung: Secondary | ICD-10-CM

## 2015-03-01 MED ORDER — POTASSIUM CHLORIDE CRYS ER 10 MEQ PO TBCR: EXTENDED_RELEASE_TABLET | ORAL | Status: DC

## 2015-03-05 ENCOUNTER — Telehealth: Payer: Self-pay | Admitting: *Deleted

## 2015-03-05 MED ORDER — GEFITINIB 250 MG PO TABS: 250.0000 mg | ORAL_TABLET | Freq: Every day | ORAL | Status: DC

## 2015-03-05 NOTE — Telephone Encounter (Signed)
escribed rx for iressa

## 2015-03-28 ENCOUNTER — Inpatient Hospital Stay: Payer: BLUE CROSS/BLUE SHIELD

## 2015-03-30 ENCOUNTER — Inpatient Hospital Stay: Payer: BLUE CROSS/BLUE SHIELD

## 2015-03-30 ENCOUNTER — Inpatient Hospital Stay: Payer: BLUE CROSS/BLUE SHIELD | Attending: Internal Medicine

## 2015-03-30 DIAGNOSIS — C7931 Secondary malignant neoplasm of brain: Secondary | ICD-10-CM | POA: Diagnosis not present

## 2015-03-30 DIAGNOSIS — C3492 Malignant neoplasm of unspecified part of left bronchus or lung: Secondary | ICD-10-CM

## 2015-03-30 DIAGNOSIS — C3412 Malignant neoplasm of upper lobe, left bronchus or lung: Secondary | ICD-10-CM | POA: Diagnosis not present

## 2015-03-30 DIAGNOSIS — C7951 Secondary malignant neoplasm of bone: Secondary | ICD-10-CM | POA: Diagnosis not present

## 2015-03-30 LAB — CBC WITH DIFFERENTIAL/PLATELET
Basophils Absolute: 0.1 10*3/uL (ref 0–0.1)
Basophils Relative: 1 %
EOS ABS: 0.3 10*3/uL (ref 0–0.7)
Eosinophils Relative: 4 %
HCT: 42 % (ref 40.0–52.0)
HEMOGLOBIN: 13.6 g/dL (ref 13.0–18.0)
LYMPHS ABS: 2 10*3/uL (ref 1.0–3.6)
Lymphocytes Relative: 27 %
MCH: 24.8 pg — AB (ref 26.0–34.0)
MCHC: 32.5 g/dL (ref 32.0–36.0)
MCV: 76.2 fL — ABNORMAL LOW (ref 80.0–100.0)
MONOS PCT: 9 %
Monocytes Absolute: 0.7 10*3/uL (ref 0.2–1.0)
NEUTROS ABS: 4.4 10*3/uL (ref 1.4–6.5)
NEUTROS PCT: 59 %
Platelets: 284 10*3/uL (ref 150–440)
RBC: 5.51 MIL/uL (ref 4.40–5.90)
RDW: 14.9 % — ABNORMAL HIGH (ref 11.5–14.5)
WBC: 7.4 10*3/uL (ref 3.8–10.6)

## 2015-03-30 LAB — PHOSPHORUS: Phosphorus: 3 mg/dL (ref 2.5–4.6)

## 2015-03-30 LAB — COMPREHENSIVE METABOLIC PANEL
ALBUMIN: 3.7 g/dL (ref 3.5–5.0)
ALT: 43 U/L (ref 17–63)
AST: 31 U/L (ref 15–41)
Alkaline Phosphatase: 85 U/L (ref 38–126)
Anion gap: 6 (ref 5–15)
BUN: 11 mg/dL (ref 6–20)
CO2: 24 mmol/L (ref 22–32)
CREATININE: 1.03 mg/dL (ref 0.61–1.24)
Calcium: 8.7 mg/dL — ABNORMAL LOW (ref 8.9–10.3)
Chloride: 106 mmol/L (ref 101–111)
GFR calc Af Amer: >60 mL/min (ref >60)
GFR calc non Af Amer: >60 mL/min (ref >60)
Glucose, Bld: 100 mg/dL — ABNORMAL HIGH (ref 65–99)
Potassium: 3.7 mmol/L (ref 3.5–5.1)
SODIUM: 136 mmol/L (ref 135–145)
TOTAL PROTEIN: 8.1 g/dL (ref 6.5–8.1)
Total Bilirubin: 0.6 mg/dL (ref 0.3–1.2)

## 2015-03-30 LAB — MAGNESIUM: Magnesium: 1.8 mg/dL (ref 1.7–2.4)

## 2015-03-30 MED ORDER — DENOSUMAB 120 MG/1.7ML ~~LOC~~ SOLN
120.0000 mg | Freq: Once | SUBCUTANEOUS | Status: AC
  Administered 2015-03-30: 120 mg via SUBCUTANEOUS
  Filled 2015-03-30: qty 1.7

## 2015-04-25 ENCOUNTER — Inpatient Hospital Stay (HOSPITAL_BASED_OUTPATIENT_CLINIC_OR_DEPARTMENT_OTHER): Payer: BLUE CROSS/BLUE SHIELD | Admitting: Family Medicine

## 2015-04-25 ENCOUNTER — Inpatient Hospital Stay: Payer: BLUE CROSS/BLUE SHIELD

## 2015-04-25 ENCOUNTER — Inpatient Hospital Stay: Payer: BLUE CROSS/BLUE SHIELD | Attending: Family Medicine

## 2015-04-25 VITALS — BP 115/80 | HR 81 | Temp 97.8°F | Resp 16 | Wt 138.0 lb

## 2015-04-25 DIAGNOSIS — C3412 Malignant neoplasm of upper lobe, left bronchus or lung: Secondary | ICD-10-CM | POA: Insufficient documentation

## 2015-04-25 DIAGNOSIS — R634 Abnormal weight loss: Secondary | ICD-10-CM

## 2015-04-25 DIAGNOSIS — C7931 Secondary malignant neoplasm of brain: Secondary | ICD-10-CM | POA: Diagnosis not present

## 2015-04-25 DIAGNOSIS — M549 Dorsalgia, unspecified: Secondary | ICD-10-CM | POA: Insufficient documentation

## 2015-04-25 DIAGNOSIS — Z79899 Other long term (current) drug therapy: Secondary | ICD-10-CM | POA: Diagnosis not present

## 2015-04-25 DIAGNOSIS — R5381 Other malaise: Secondary | ICD-10-CM | POA: Insufficient documentation

## 2015-04-25 DIAGNOSIS — I10 Essential (primary) hypertension: Secondary | ICD-10-CM | POA: Diagnosis not present

## 2015-04-25 DIAGNOSIS — C7951 Secondary malignant neoplasm of bone: Secondary | ICD-10-CM | POA: Diagnosis not present

## 2015-04-25 DIAGNOSIS — R5383 Other fatigue: Secondary | ICD-10-CM | POA: Insufficient documentation

## 2015-04-25 DIAGNOSIS — R110 Nausea: Secondary | ICD-10-CM | POA: Insufficient documentation

## 2015-04-25 DIAGNOSIS — C3492 Malignant neoplasm of unspecified part of left bronchus or lung: Secondary | ICD-10-CM

## 2015-04-25 DIAGNOSIS — C3490 Malignant neoplasm of unspecified part of unspecified bronchus or lung: Secondary | ICD-10-CM

## 2015-04-25 LAB — COMPREHENSIVE METABOLIC PANEL
ALK PHOS: 101 U/L (ref 38–126)
ALT: 72 U/L — AB (ref 17–63)
AST: 44 U/L — AB (ref 15–41)
Albumin: 3.7 g/dL (ref 3.5–5.0)
Anion gap: 6 (ref 5–15)
BUN: 17 mg/dL (ref 6–20)
CALCIUM: 8.3 mg/dL — AB (ref 8.9–10.3)
CO2: 26 mmol/L (ref 22–32)
CREATININE: 0.95 mg/dL (ref 0.61–1.24)
Chloride: 102 mmol/L (ref 101–111)
GFR calc Af Amer: >60 mL/min (ref >60)
Glucose, Bld: 105 mg/dL — ABNORMAL HIGH (ref 65–99)
Potassium: 3.4 mmol/L — ABNORMAL LOW (ref 3.5–5.1)
SODIUM: 134 mmol/L — AB (ref 135–145)
Total Bilirubin: 0.4 mg/dL (ref 0.3–1.2)
Total Protein: 8.2 g/dL — ABNORMAL HIGH (ref 6.5–8.1)

## 2015-04-25 LAB — CBC WITH DIFFERENTIAL/PLATELET
Basophils Absolute: 0 10*3/uL (ref 0–0.1)
Basophils Relative: 1 %
EOS ABS: 0.2 10*3/uL (ref 0–0.7)
EOS PCT: 3 %
HCT: 42 % (ref 40.0–52.0)
Hemoglobin: 13.7 g/dL (ref 13.0–18.0)
LYMPHS PCT: 27 %
Lymphs Abs: 2.4 10*3/uL (ref 1.0–3.6)
MCH: 24.5 pg — ABNORMAL LOW (ref 26.0–34.0)
MCHC: 32.5 g/dL (ref 32.0–36.0)
MCV: 75.4 fL — ABNORMAL LOW (ref 80.0–100.0)
Monocytes Absolute: 0.8 10*3/uL (ref 0.2–1.0)
Monocytes Relative: 9 %
Neutro Abs: 5.3 10*3/uL (ref 1.4–6.5)
Neutrophils Relative %: 60 %
PLATELETS: 328 10*3/uL (ref 150–440)
RBC: 5.57 MIL/uL (ref 4.40–5.90)
RDW: 15.3 % — ABNORMAL HIGH (ref 11.5–14.5)
WBC: 8.8 10*3/uL (ref 3.8–10.6)

## 2015-04-25 LAB — MAGNESIUM: MAGNESIUM: 1.8 mg/dL (ref 1.7–2.4)

## 2015-04-25 LAB — PHOSPHORUS: Phosphorus: 3.3 mg/dL (ref 2.5–4.6)

## 2015-04-25 MED ORDER — DENOSUMAB 120 MG/1.7ML ~~LOC~~ SOLN
120.0000 mg | Freq: Once | SUBCUTANEOUS | Status: AC
  Administered 2015-04-25: 120 mg via SUBCUTANEOUS
  Filled 2015-04-25: qty 1.7

## 2015-04-25 MED ORDER — POTASSIUM CHLORIDE CRYS ER 10 MEQ PO TBCR: 10.0000 meq | EXTENDED_RELEASE_TABLET | Freq: Two times a day (BID) | ORAL | Status: DC

## 2015-04-25 NOTE — Progress Notes (Signed)
[place]  Telephone:(336) [phone]  Fax:(336) [phone]     Luis [name] DOB: 1966-02-25  MR#: [id number]  QMG#:[id number]  Patient Care Team: Leia Alf, MD as PCP - General (Internal Medicine)  CHIEF COMPLAINT:  Chief Complaint  Patient presents with  . 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 Getting palliative radiation.    INTERVAL HISTORY:  Patient is here for further evaluation and treatment consideration regarding stage IV adenocarcinoma of the left upper lobe of lung. Patient also noted to have bone, thoracic spine, and brain metastasis. Patient did undergo decompression surgery in August 2015. Patient was last seen in August by Dr. Ma Hillock. He is currently on Iressa since November 2015. Patient reports that he is recently began having increasing pain, nausea, weight loss, and fatigue.  REVIEW OF SYSTEMS:   Review of Systems  Constitutional: Positive for weight loss and malaise/fatigue. Negative for fever, chills and diaphoresis.       Poor appetite  HENT: Negative for congestion, ear discharge, ear pain, hearing loss, nosebleeds, sore throat and tinnitus.   Eyes: Negative for blurred vision, double vision, photophobia, pain, discharge and redness.  Respiratory: Negative for cough, hemoptysis, sputum production, shortness of breath, wheezing and stridor.   Cardiovascular: Negative for chest pain, palpitations, orthopnea, claudication, leg swelling and PND.  Gastrointestinal: Positive for nausea. Negative for heartburn, vomiting, abdominal pain, diarrhea, constipation, blood in stool and melena.  Genitourinary: Negative.   Musculoskeletal: Positive for back pain.  Skin: Negative.   Neurological: Negative for dizziness, tingling, focal weakness, seizures, weakness and headaches.  Endo/Heme/Allergies: Does not  bruise/bleed easily.  Psychiatric/Behavioral: Negative for depression. The patient is not nervous/anxious and does not have insomnia.     As per HPI. Otherwise, a complete review of systems is negatve.  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 Getting palliative radiation.     Adenocarcinoma of lung ([place])   12/05/2014 Initial Diagnosis Adenocarcinoma of lung ([place])    PAST MEDICAL HISTORY: Past Medical History  Diagnosis Date  . HTN (hypertension)   . Adenocarcinoma of lung 12/05/2014    PAST SURGICAL HISTORY: Past Surgical History  Procedure Laterality Date  . Laminectomy N/A 03/07/2014    Procedure: T1-T2 Laminectomy with Decompression of Cord and Removal of Cancer  thoracic one/two;  Surgeon: Kristeen Miss, MD;  Location: [place] [facility];  Service: Neurosurgery;  Laterality: N/A;    FAMILY HISTORY Family History  Problem Relation Age of Onset  . Cancer Mother   . Heart disease Father   . Hypertension Sister   . Hypertension Brother     GYNECOLOGIC HISTORY:  No LMP for male patient.     ADVANCED DIRECTIVES:    HEALTH MAINTENANCE: Social History  Substance Use Topics  . Smoking status: Never Smoker   . Smokeless tobacco: Not on file  . Alcohol Use: 0.5 oz/week    1 drink(s) per week     Colonoscopy:  PAP:  Bone density:  Lipid panel:  No Known Allergies  Current Outpatient Prescriptions  Medication Sig Dispense Refill  . gefitinib (IRESSA) 250 MG tablet Take 1 tablet (250 mg total) by mouth daily. 30 tablet 3  . hydrochlorothiazide (HYDRODIURIL) 25 MG tablet Take 25 mg by mouth daily.    Marland Kitchen  lisinopril (PRINIVIL,ZESTRIL) 40 MG tablet Take 40 mg by mouth daily.    . metoprolol succinate (TOPROL-XL) 50 MG 24 hr tablet Take 50 mg by mouth at bedtime.    . senna-docusate (SENOKOT S) 8.6-50 MG  per tablet Take 1-2 tablets by mouth at bedtime. For constipation 60 tablet 4  . traMADol (ULTRAM) 50 MG tablet Take 1 tablet (50 mg total) by mouth every 8 (eight) hours as needed for moderate pain. For pain 60 tablet 0  . dexamethasone (DECADRON) 2 MG tablet Take 1 tablet (2 mg total) by mouth 3 (three) times daily. (Patient not taking: Reported on 04/25/2015) 90 tablet 4  . HYDROcodone-acetaminophen (NORCO/VICODIN) 5-325 MG per tablet Take 1-2 tablets by mouth every 4 (four) hours as needed for moderate pain or severe pain (mild pain). (Patient not taking: Reported on 04/25/2015) 90 tablet 0  . hydrocortisone valerate ointment (WEST-CORT) 0.2 % Apply 1 application topically 2 (two) times daily. Applied to affected area twice daily    . methocarbamol (ROBAXIN) 500 MG tablet Take 1 tablet (500 mg total) by mouth every 6 (six) hours as needed for muscle spasms. (Patient not taking: Reported on 04/25/2015) 90 tablet 3  . ondansetron (ZOFRAN ODT) 4 MG disintegrating tablet Take 1 tablet (4 mg total) by mouth every 8 (eight) hours as needed for nausea or vomiting. (Patient not taking: Reported on 04/25/2015) 60 tablet 1  . potassium chloride (K-DUR,KLOR-CON) 10 MEQ tablet Take 1 tablet (10 mEq total) by mouth 2 (two) times daily. 60 tablet 3   No current facility-administered medications for this visit.    OBJECTIVE: BP 115/80 mmHg  Pulse 81  Temp(Src) 97.8 F (36.6 C) (Tympanic)  Resp 16  Wt 138 lb 0.1 oz (62.6 kg)   Body mass index is 21.61 kg/(m^2).    ECOG FS:1 - Symptomatic but completely ambulatory  General: Well-developed, well-nourished, no acute distress. Eyes: Pink conjunctiva, anicteric sclera. HEENT: Normocephalic, moist mucous membranes, clear oropharnyx. Lungs: Clear to auscultation bilaterally. Heart: Regular rate and rhythm. No rubs, murmurs, or gallops. Abdomen: Soft, nontender, nondistended. No organomegaly noted, normoactive bowel sounds. Musculoskeletal: No edema, cyanosis,  or clubbing. Neuro: Alert, answering all questions appropriately. Cranial nerves grossly intact. Skin: No rashes or petechiae noted. Psych: Normal affect. Lymphatics: No cervical or clavicular lymphadenopathy.  LAB RESULTS:  Appointment on 04/25/2015  Component Date Value Ref Range Status  . WBC 04/25/2015 8.8  3.8 - 10.6 K/uL Final  . RBC 04/25/2015 5.57  4.40 - 5.90 MIL/uL Final  . Hemoglobin 04/25/2015 13.7  13.0 - 18.0 g/dL Final  . HCT 04/25/2015 42.0  40.0 - 52.0 % Final  . MCV 04/25/2015 75.4* 80.0 - 100.0 fL Final  . MCH 04/25/2015 24.5* 26.0 - 34.0 pg Final  . MCHC 04/25/2015 32.5  32.0 - 36.0 g/dL Final  . RDW 04/25/2015 15.3* 11.5 - 14.5 % Final  . Platelets 04/25/2015 328  150 - 440 K/uL Final  . Neutrophils Relative % 04/25/2015 60   Final  . Neutro Abs 04/25/2015 5.3  1.4 - 6.5 K/uL Final  . Lymphocytes Relative 04/25/2015 27   Final  . Lymphs Abs 04/25/2015 2.4  1.0 - 3.6 K/uL Final  . Monocytes Relative 04/25/2015 9   Final  . Monocytes Absolute 04/25/2015 0.8  0.2 - 1.0 K/uL Final  . Eosinophils Relative 04/25/2015 3   Final  . Eosinophils Absolute 04/25/2015 0.2  0 - 0.7 K/uL Final  . Basophils Relative 04/25/2015 1   Final  .  Basophils Absolute 04/25/2015 0.0  0 - 0.1 K/uL Final  . Sodium 04/25/2015 134* 135 - 145 mmol/L Final  . Potassium 04/25/2015 3.4* 3.5 - 5.1 mmol/L Final  . Chloride 04/25/2015 102  101 - 111 mmol/L Final  . CO2 04/25/2015 26  22 - 32 mmol/L Final  . Glucose, Bld 04/25/2015 105* 65 - 99 mg/dL Final  . BUN 04/25/2015 17  6 - 20 mg/dL Final  . Creatinine, Ser 04/25/2015 0.95  0.61 - 1.24 mg/dL Final  . Calcium 04/25/2015 8.3* 8.9 - 10.3 mg/dL Final  . Total Protein 04/25/2015 8.2* 6.5 - 8.1 g/dL Final  . Albumin 04/25/2015 3.7  3.5 - 5.0 g/dL Final  . AST 04/25/2015 44* 15 - 41 U/L Final  . ALT 04/25/2015 72* 17 - 63 U/L Final  . Alkaline Phosphatase 04/25/2015 101  38 - 126 U/L Final  . Total Bilirubin 04/25/2015 0.4  0.3 - 1.2 mg/dL  Final  . GFR calc non Af Amer 04/25/2015 >60  >60 mL/min Final  . GFR calc Af Amer 04/25/2015 >60  >60 mL/min Final   Comment: (NOTE) The eGFR has been calculated using the CKD EPI equation. This calculation has not been validated in all clinical situations. eGFR's persistently <60 mL/min signify possible Chronic Kidney Disease.   . Anion gap 04/25/2015 6  5 - 15 Final  . Magnesium 04/25/2015 1.8  1.7 - 2.4 mg/dL Final  . Phosphorus 04/25/2015 3.3  2.5 - 4.6 mg/dL Final    STUDIES: No results found.  ASSESSMENT:  Stage IV adenocarcinoma of left upper lobe of lung, bone, thoracic spine, and brain metastasis  PLAN:   1. Stage IV adenocarcinoma left upper lobe lung. With bone and brain metastasis and thoracic spine metastasis with paraparesis needing T1-T2 decomp[facility] surgery at [facility] August 2015. [facility] Molecular Testing 03/08/14: Genomic Alterations identified are: EGFR L858R, BRAF amplification equivocal, PFXT0W/I loss, OX73 splice site [id number]. No Reportable Alterations identified for: ALK, [name], ERBB2, KRAS, MET.  Patient was started on oral Iressa ~ 05/21/14. Treatment with Iressa 250 mg orally once daily. In light of progressing back pain, increasing nausea, weight loss and increasing fatigue will obtain CT scan of chest abdomen and pelvis for restaging. Most recent CT scan performed in July 2016. Patient is already scheduled to see Dr. Rogue Bussing on November 9 for xgeva injection.  Patient expressed understanding and was in agreement with this plan. He also understands that He can call clinic at any time with any questions, concerns, or complaints.   Dr. Oliva Bustard was available for consultation and review of plan of care for this patient.   Evlyn Kanner, NP   04/30/2015 11:56 AM

## 2015-05-01 ENCOUNTER — Other Ambulatory Visit: Payer: Self-pay | Admitting: Family Medicine

## 2015-05-01 DIAGNOSIS — C3492 Malignant neoplasm of unspecified part of left bronchus or lung: Secondary | ICD-10-CM

## 2015-05-01 MED ORDER — POTASSIUM CHLORIDE CRYS ER 10 MEQ PO TBCR: 10.0000 meq | EXTENDED_RELEASE_TABLET | Freq: Two times a day (BID) | ORAL | Status: DC

## 2015-05-12 ENCOUNTER — Other Ambulatory Visit: Payer: Self-pay | Admitting: Radiation Oncology

## 2015-05-15 ENCOUNTER — Other Ambulatory Visit: Payer: Self-pay | Admitting: Family Medicine

## 2015-05-16 ENCOUNTER — Other Ambulatory Visit: Payer: Self-pay | Admitting: Family Medicine

## 2015-05-16 ENCOUNTER — Ambulatory Visit
Admission: RE | Admit: 2015-05-16 | Discharge: 2015-05-16 | Disposition: A | Discharge status: Home / Self Care | Payer: BLUE CROSS/BLUE SHIELD | Source: Ambulatory Visit | Attending: Family Medicine | Admitting: Family Medicine

## 2015-05-16 DIAGNOSIS — R918 Other nonspecific abnormal finding of lung field: Secondary | ICD-10-CM | POA: Diagnosis not present

## 2015-05-16 DIAGNOSIS — C3492 Malignant neoplasm of unspecified part of left bronchus or lung: Secondary | ICD-10-CM | POA: Diagnosis present

## 2015-05-16 DIAGNOSIS — C7951 Secondary malignant neoplasm of bone: Secondary | ICD-10-CM | POA: Diagnosis present

## 2015-05-16 DIAGNOSIS — M5489 Other dorsalgia: Secondary | ICD-10-CM | POA: Diagnosis present

## 2015-05-16 DIAGNOSIS — G8929 Other chronic pain: Secondary | ICD-10-CM | POA: Diagnosis not present

## 2015-05-16 MED ORDER — IOHEXOL 300 MG/ML  SOLN
100.0000 mL | Freq: Once | INTRAMUSCULAR | Status: AC | PRN
  Administered 2015-05-16: 100 mL via INTRAVENOUS

## 2015-05-23 ENCOUNTER — Inpatient Hospital Stay: Payer: BLUE CROSS/BLUE SHIELD | Attending: Internal Medicine | Admitting: Internal Medicine

## 2015-05-23 ENCOUNTER — Encounter: Payer: Self-pay | Admitting: Internal Medicine

## 2015-05-23 ENCOUNTER — Other Ambulatory Visit: Payer: Self-pay | Admitting: *Deleted

## 2015-05-23 ENCOUNTER — Inpatient Hospital Stay: Payer: BLUE CROSS/BLUE SHIELD

## 2015-05-23 DIAGNOSIS — I10 Essential (primary) hypertension: Secondary | ICD-10-CM

## 2015-05-23 DIAGNOSIS — C7931 Secondary malignant neoplasm of brain: Secondary | ICD-10-CM

## 2015-05-23 DIAGNOSIS — Z79899 Other long term (current) drug therapy: Secondary | ICD-10-CM

## 2015-05-23 DIAGNOSIS — R7989 Other specified abnormal findings of blood chemistry: Secondary | ICD-10-CM | POA: Diagnosis not present

## 2015-05-23 DIAGNOSIS — R112 Nausea with vomiting, unspecified: Secondary | ICD-10-CM | POA: Diagnosis not present

## 2015-05-23 DIAGNOSIS — R197 Diarrhea, unspecified: Secondary | ICD-10-CM

## 2015-05-23 DIAGNOSIS — C3492 Malignant neoplasm of unspecified part of left bronchus or lung: Secondary | ICD-10-CM

## 2015-05-23 DIAGNOSIS — C7951 Secondary malignant neoplasm of bone: Secondary | ICD-10-CM

## 2015-05-23 DIAGNOSIS — C3412 Malignant neoplasm of upper lobe, left bronchus or lung: Secondary | ICD-10-CM | POA: Diagnosis not present

## 2015-05-23 DIAGNOSIS — M25551 Pain in right hip: Secondary | ICD-10-CM | POA: Insufficient documentation

## 2015-05-23 DIAGNOSIS — C3490 Malignant neoplasm of unspecified part of unspecified bronchus or lung: Secondary | ICD-10-CM

## 2015-05-23 LAB — COMPREHENSIVE METABOLIC PANEL
ALK PHOS: 117 U/L (ref 38–126)
ALT: 102 U/L — ABNORMAL HIGH (ref 17–63)
AST: 60 U/L — ABNORMAL HIGH (ref 15–41)
Albumin: 3.6 g/dL (ref 3.5–5.0)
Anion gap: 8 (ref 5–15)
BUN: 13 mg/dL (ref 6–20)
CALCIUM: 8.8 mg/dL — AB (ref 8.9–10.3)
CHLORIDE: 103 mmol/L (ref 101–111)
CO2: 25 mmol/L (ref 22–32)
CREATININE: 0.88 mg/dL (ref 0.61–1.24)
GFR calc Af Amer: >60 mL/min (ref >60)
GFR calc non Af Amer: >60 mL/min (ref >60)
GLUCOSE: 93 mg/dL (ref 65–99)
Potassium: 3.9 mmol/L (ref 3.5–5.1)
Sodium: 136 mmol/L (ref 135–145)
Total Bilirubin: 0.4 mg/dL (ref 0.3–1.2)
Total Protein: 7.9 g/dL (ref 6.5–8.1)

## 2015-05-23 LAB — CBC WITH DIFFERENTIAL/PLATELET
Basophils Absolute: 0 10*3/uL (ref 0–0.1)
Basophils Relative: 1 %
EOS ABS: 0.2 10*3/uL (ref 0–0.7)
Eosinophils Relative: 4 %
HEMATOCRIT: 41.7 % (ref 40.0–52.0)
HEMOGLOBIN: 13.3 g/dL (ref 13.0–18.0)
LYMPHS ABS: 1.7 10*3/uL (ref 1.0–3.6)
Lymphocytes Relative: 26 %
MCH: 24.1 pg — AB (ref 26.0–34.0)
MCHC: 31.9 g/dL — AB (ref 32.0–36.0)
MCV: 75.6 fL — ABNORMAL LOW (ref 80.0–100.0)
MONOS PCT: 10 %
Monocytes Absolute: 0.6 10*3/uL (ref 0.2–1.0)
NEUTROS ABS: 4 10*3/uL (ref 1.4–6.5)
NEUTROS PCT: 59 %
Platelets: 321 10*3/uL (ref 150–440)
RBC: 5.51 MIL/uL (ref 4.40–5.90)
RDW: 15.7 % — ABNORMAL HIGH (ref 11.5–14.5)
WBC: 6.7 10*3/uL (ref 3.8–10.6)

## 2015-05-23 LAB — PHOSPHORUS: Phosphorus: 2.9 mg/dL (ref 2.5–4.6)

## 2015-05-23 LAB — MAGNESIUM: Magnesium: 2 mg/dL (ref 1.7–2.4)

## 2015-05-23 MED ORDER — AFATINIB DIMALEATE 40 MG PO TABS: 40.0000 mg | ORAL_TABLET | Freq: Every day | ORAL | Status: DC

## 2015-05-23 MED ORDER — DENOSUMAB 120 MG/1.7ML ~~LOC~~ SOLN
120.0000 mg | Freq: Once | SUBCUTANEOUS | Status: AC
  Administered 2015-05-23: 120 mg via SUBCUTANEOUS
  Filled 2015-05-23: qty 1.7

## 2015-05-23 NOTE — Progress Notes (Signed)
[place] OFFICE PROGRESS NOTE  [name] Care Team: Leia Alf, MD as PCP - General (Internal Medicine)   SUMMARY OF ONCOLOGIC HISTORY:  # AUG 2015-  METASTATIC ADENO CA LUNG POSITIVE for EGFR Exon 21 L858R [neg- ALK;[name];[name],MET]; [name]; NOV [id number]- CT- Progression [RUL & Pelvic sclerotic lesions];   # AUG 2015- Thoracic spine mets s/p decompression & Cerebellar brain met s/p RT   # Bony mets- on X-geva q 4 w  INTERVAL HISTORY:  This is my first interaction with the [name] since I joined the practice September 2016. I reviewed the [name]'s prior chart/pertinent labs/imaging in detail; findings are summarized.  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   [name] complains of ongoing diarrhea up to 3-4 loose stools a day; again in approximately 3-4 times a week. [name] has not been on any Imodium. He has intermittent nausea with vomiting. He does not take any antiemetics. She has not lost any significant weight. His appetite is fair.  He denies any new onset of any back pain. Denies any new chest pain or shortness of breath or cough or hemoptysis.  His planning to go to [place]- in the end of November/middle of December 2016.    REVIEW OF SYSTEMS:  A complete 10 point review of system is done which is negative except mentioned above/history of present illness.   PAST MEDICAL HISTORY :  Past Medical History  Diagnosis Date  . HTN (hypertension)   . Adenocarcinoma of lung ([place]) 12/05/2014    PAST SURGICAL HISTORY :   Past Surgical History  Procedure Laterality Date  . Laminectomy N/A 03/07/2014    Procedure: T1-T2 Laminectomy with Decompression of Cord and Removal of Cancer  thoracic one/two;  Surgeon: Kristeen Miss, MD;  Location: [facility];  Service: Neurosurgery;   Laterality: N/A;    FAMILY HISTORY :   Family History  Problem Relation Age of Onset  . Cancer Mother   . Heart disease Father   . Hypertension Sister   . Hypertension Brother     SOCIAL HISTORY:   Social History  Substance Use Topics  . Smoking status: Never Smoker   . Smokeless tobacco: Never Used  . Alcohol Use: 0.5 oz/week    1 Standard drinks or equivalent per week    ALLERGIES:  has No Known Allergies.  MEDICATIONS:  Current Outpatient Prescriptions  Medication Sig Dispense Refill  . gefitinib (IRESSA) 250 MG tablet Take 1 tablet (250 mg total) by mouth daily. 30 tablet 3  . hydrochlorothiazide (HYDRODIURIL) 25 MG tablet Take 25 mg by mouth daily.    . metoprolol succinate (TOPROL-XL) 50 MG 24 hr tablet Take 50 mg by mouth at bedtime.    . ondansetron (ZOFRAN ODT) 4 MG disintegrating tablet Take 1 tablet (4 mg total) by mouth every 8 (eight) hours as needed for nausea or vomiting. 60 tablet 1  . potassium chloride (K-DUR,KLOR-CON) 10 MEQ tablet Take 1 tablet (10 mEq total) by mouth 2 (two) times daily. 60 tablet 3  . afatinib dimaleate (GILOTRIF) 40 MG tablet Take 1 tablet (40 mg total) by mouth daily. Take on an empty stomach 1hr before or 2 hrs after meals. Do NOT start until okay with doc. 30 tablet 6   No current facility-administered medications for this visit.   Facility-Administered Medications Ordered in  Other Visits  Medication Dose Route Frequency Anel Creighton Last Rate Last Dose  . denosumab (XGEVA) injection 120 mg  120 mg Subcutaneous Once Evlyn Kanner, NP        PHYSICAL EXAMINATION: ECOG PERFORMANCE STATUS: 1 - Symptomatic but completely ambulatory  There were no vitals taken for this visit.  There were no vitals filed for this visit.  GENERAL: Well-nourished well-developed; Alert, no distress and comfortable.   Accompanied by his wife. EYES: no pallor or icterus OROPHARYNX: no thrush or ulceration; good dentition  NECK: supple, no masses  felt LYMPH:  no palpable lymphadenopathy in the cervical, axillary or inguinal regions LUNGS: clear to auscultation and  No wheeze or crackles HEART/CVS: regular rate & rhythm and no murmurs; No lower extremity edema ABDOMEN:abdomen soft, non-tender and normal bowel sounds Musculoskeletal:no cyanosis of digits and no clubbing  PSYCH: alert & oriented x 3 with fluent speech NEURO: no focal motor/sensory deficits SKIN:  no rashes or significant lesions  LABORATORY DATA:  I have reviewed the data as listed    Component Value Date/Time   NA 136 Luis [name]/03/2015 1346   NA 141 11/08/2014 1505   K 3.9 Luis [name]/03/2015 1346   K 3.7 11/08/2014 1505   CL 103 Luis [name]/03/2015 1346   CL 106 11/08/2014 1505   CO2 25 Luis [name]/03/2015 1346   CO2 29 11/08/2014 1505   GLUCOSE 93 Luis [name]/03/2015 1346   GLUCOSE 75 11/08/2014 1505   BUN 13 Luis [name]/03/2015 1346   BUN 13 11/08/2014 1505   CREATININE 0.88 Luis [name]/03/2015 1346   CREATININE 1.15 11/08/2014 1505   CALCIUM 8.8* Luis [name]/03/2015 1346   CALCIUM 9.1 11/08/2014 1505   PROT 7.9 Luis [name]/03/2015 1346   PROT 7.7 11/08/2014 1505   ALBUMIN 3.6 Luis [name]/03/2015 1346   ALBUMIN 4.0 11/08/2014 1505   AST 60* Luis [name]/03/2015 1346   AST 31 11/08/2014 1505   ALT 102* Luis [name]/03/2015 1346   ALT 53 11/08/2014 1505   ALKPHOS 117 Luis [name]/03/2015 1346   ALKPHOS 62 11/08/2014 1505   BILITOT 0.4 Luis [name]/03/2015 1346   BILITOT 0.6 11/08/2014 1505   GFRNONAA >60 Luis [name]/03/2015 1346   GFRNONAA >60 11/08/2014 1505   GFRNONAA >60 08/01/2014 1023   GFRAA >60 Luis [name]/03/2015 1346   GFRAA >60 11/08/2014 1505   GFRAA >60 08/01/2014 1023    No results found for: SPEP, UPEP  Lab Results  Component Value Date   WBC 6.7 Luis [name]/03/2015   NEUTROABS 4.0 Luis [name]/03/2015   HGB 13.3 Luis [name]/03/2015   HCT 41.7 Luis [name]/03/2015   MCV 75.6* Luis [name]/03/2015   PLT 321 Luis [name]/03/2015      Chemistry      Component Value Date/Time   NA 136 Luis [name]/03/2015 1346   NA 141 11/08/2014 1505   K 3.9 Luis [name]/03/2015 1346   K 3.7 11/08/2014 1505   CL 103 Luis [name]/03/2015 1346   CL 106  11/08/2014 1505   CO2 25 Luis [name]/03/2015 1346   CO2 29 11/08/2014 1505   BUN 13 Luis [name]/03/2015 1346   BUN 13 11/08/2014 1505   CREATININE 0.88 Luis [name]/03/2015 1346   CREATININE 1.15 11/08/2014 1505      Component Value Date/Time   CALCIUM 8.8* Luis [name]/03/2015 1346   CALCIUM 9.1 11/08/2014 1505   ALKPHOS 117 Luis [name]/03/2015 1346   ALKPHOS 62 11/08/2014 1505   AST 60* Luis [name]/03/2015 1346   AST 31 11/08/2014 1505   ALT 102* Luis [name]/03/2015 1346   ALT 53 11/08/2014 1505   BILITOT 0.4 Luis [name]/03/2015 1346   BILITOT 0.6 11/08/2014 1505       RADIOGRAPHIC STUDIES: CT scan  reviewed as discussed below  ASSESSMENT & PLAN:   # Metastatic adenocarcinoma of the lung with EGFR mutation/currently on first an therapy with Iressa. Most recent CT scan done on November 2016- shows mild-moderate progression of the-left upper lobe lung mass at 3 x 2.7 cm; previously 2.4 and 1.5 cm. Also new sclerotic lesions in the right sacrum; and also L4 vertebral body.  I reviewed the scans myself reviewed the images with the [name] and his wife in detail.  # I discussed at length the available treatment options given the progression including #1 switching to chemotherapy #2 continue gefitinib had radiation to the lung #3 switched afatinib and radiation to the lung #4 get a lung biopsy and if he has mutation T 39 M- he might be a candidate for Targiso.   After all the possibilities mentioned above- currently we will start the [name] on afatinib 40 mg a day; and then recommend radiation to the lung. And also get a biopsy of the lung lesion- for reasons discussed above. However if a liquid biopsy is feasible-we will try to avoid the lung biopsy.   # History of bony metastases- continue X-geva. No concerns for osteonecrosis of the jaw  # Given history of brain metastases. Recommend MRI of the brain.   # Mildly elevated LFTs- grade 1 likely from gefitinib. Check LFTs in 2 weeks/next visit.  The above plan of care was discussed with the [name] and  his wife in detail. All questions were answered.       Orders Placed This Encounter  Procedures  . CT Biopsy    Standing Status: Future     Number of Occurrences:      Standing Expiration Date: Luis [name]/03/2016    Order Specific Question:  Lab orders requested (DO NOT place separate lab orders, these will be automatically ordered during procedure specimen collection):    Answer:  Surgical Pathology    Order Specific Question:  Reason for Exam (SYMPTOM  OR DIAGNOSIS REQUIRED)    Answer:  progressive lung cancer; looking for new mutations/target for new drugs; Left upper lobe    Order Specific Question:  Preferred imaging location?    Answer:  [facility]    Order Specific Question:  Call Report- Best Contact Number?    Answer:  [phone] Dr.Brahmanday- call me with questions  . MR Brain W Wo Contrast    Standing Status: Future     Number of Occurrences:      Standing Expiration Date: 07/22/2016    Order Specific Question:  Reason for Exam (SYMPTOM  OR DIAGNOSIS REQUIRED)    Answer:  lung cancer with hx of brain metastases    Order Specific Question:  Preferred imaging location?    Answer:  [facility]    Order Specific Question:  Does the [name] have a pacemaker or implanted devices?    Answer:  No    Order Specific Question:  What is the [name]'s sedation requirement?    Answer:  No Sedation   All questions were answered. The [name] knows to call the clinic with any problems, questions or concerns. No barriers to learning was detected. I spent 30 minutes counseling the [name] face to face. The total time spent in the appointment was 40 minutes and more than 50% was on counseling and review of test results     Cammie Sickle, MD Luis [name]/03/2015 2:58 PM

## 2015-05-23 NOTE — Progress Notes (Signed)
PT FATIGUED, APPETITE IS DEC.  At times he has nausea but does not want to take medication for it.  He occ. Vomits and feels better afterwards.  He goes between constipation and diarrhea and when he has diarrhea he takes imodium on occ. He is planning [place] end of nov to mid dec. To pick up his son and stay there 2 weeks

## 2015-05-23 NOTE — Patient Instructions (Signed)
Continue taking the Iressa.

## 2015-05-24 ENCOUNTER — Ambulatory Visit
Admission: RE | Admit: 2015-05-24 | Discharge: 2015-05-24 | Disposition: A | Discharge status: Home / Self Care | Payer: BLUE CROSS/BLUE SHIELD | Source: Ambulatory Visit | Attending: Radiation Oncology | Admitting: Radiation Oncology

## 2015-05-24 ENCOUNTER — Ambulatory Visit
Admission: RE | Admit: 2015-05-24 | Discharge: 2015-05-24 | Disposition: A | Discharge status: Home / Self Care | Payer: BLUE CROSS/BLUE SHIELD | Source: Ambulatory Visit | Attending: Internal Medicine | Admitting: Internal Medicine

## 2015-05-24 ENCOUNTER — Encounter: Payer: Self-pay | Admitting: Radiation Oncology

## 2015-05-24 VITALS — BP 120/83 | HR 90 | Temp 90.0°F | Resp 20 | Ht 67.0 in | Wt 140.7 lb

## 2015-05-24 DIAGNOSIS — C3492 Malignant neoplasm of unspecified part of left bronchus or lung: Secondary | ICD-10-CM | POA: Diagnosis present

## 2015-05-24 DIAGNOSIS — C7931 Secondary malignant neoplasm of brain: Secondary | ICD-10-CM | POA: Insufficient documentation

## 2015-05-24 DIAGNOSIS — Z08 Encounter for follow-up examination after completed treatment for malignant neoplasm: Secondary | ICD-10-CM | POA: Insufficient documentation

## 2015-05-24 DIAGNOSIS — Z510 Encounter for antineoplastic radiation therapy: Secondary | ICD-10-CM | POA: Insufficient documentation

## 2015-05-24 DIAGNOSIS — C3412 Malignant neoplasm of upper lobe, left bronchus or lung: Secondary | ICD-10-CM | POA: Insufficient documentation

## 2015-05-24 DIAGNOSIS — M899 Disorder of bone, unspecified: Secondary | ICD-10-CM | POA: Insufficient documentation

## 2015-05-24 MED ORDER — GADOBENATE DIMEGLUMINE 529 MG/ML IV SOLN
12.0000 mL | Freq: Once | INTRAVENOUS | Status: AC | PRN
  Administered 2015-05-24: 12 mL via INTRAVENOUS

## 2015-05-24 NOTE — Progress Notes (Signed)
Radiation Oncology Follow up Note  Name: Luis [name]   Date:   05/24/2015 MRN:  [id number] DOB: [birth year]    This 49 y.o. male presents to the clinic today for old patient new area for stage IV lung cancer now with progressive in of pulmonary mass is left upper lobe.  REFERRING PROVIDER: Kristeen Miss, MD  HPI: Patient is a 49 year old male well-known to department having received whole brain radiation for stage IV lung cancer with both brain and bone metastasis. He also is previously received radiation therapy to his thoracic spine for cord compression. He completed his radiation to his whole brain approximate 1 year prior.[employer] Alterations identified are: EGFR L858R, BRAF amplification equivocal, TAVW9V/X loss, YI01 splice site [id number]. patient is currently on Iressa with recent CT scan in November showing mild to moderate progression of left upper lobe lung mass now measuring 3 x 2.7 cm. He also has some new sclerotic lesions in the right sacrum and L4 vertebral body although was not having significant pain in those areas. He specifically denies cough hemoptysis or chest tightness. I've asked to evaluate the patient for radiation therapy to his progressive left upper lobe lesion.   COMPLICATIONS OF TREATMENT: none  FOLLOW UP COMPLIANCE: keeps appointments   PHYSICAL EXAM:  BP 120/83 mmHg  Pulse 90  Temp(Src) 90 F (32.2 C) (Tympanic)  Resp 20  Ht 5' 7"  (1.702 m)  Wt 140 lb 10.5 oz (63.8 kg)  BMI 22.02 kg/m2 Thin well-developed male in NAD. Some minimal pain is exhibited on deep palpation of his spine. Well-developed well-nourished patient in NAD. HEENT reveals PERLA, EOMI, discs not visualized.  Oral cavity is clear. No oral mucosal lesions are identified. Neck is clear without evidence of cervical or supraclavicular adenopathy. Lungs are clear to A&P. Cardiac examination is essentially unremarkable with regular rate and rhythm without murmur rub or thrill. Abdomen is benign  with no organomegaly or masses noted. Motor sensory and DTR levels are equal and symmetric in the upper and lower extremities. Cranial nerves II through XII are grossly intact. Proprioception is intact. No peripheral adenopathy or edema is identified. No motor or sensory levels are noted. Crude visual fields are within normal range.  RADIOLOGY RESULTS: Recent CT scans and serial CT scans are reviewed  PLAN: Progressive stage IV lung cancer with progression of disease in his left upper lobe. At this time I to go ahead with radiation therapy to his left upper lobe. I like to use I MRT treatment planning and delivery to spare critical structures such as previously irradiated thoracic spine, heart and normal lung volume. Would plan on 600 cGy in 10 fractions. Risks and benefits of treatment including possible increased cough skin reaction fatigue alteration of blood counts and possible slight dysphasia from radiation esophagitis all were discussed in detail with the patient. He seems to comprehend my treatment plan well. I have set up and ordered CT simulation on the patient early next week.  I would like to take this opportunity for allowing me to participate in the care of your patient.Armstead Peaks., MD

## 2015-05-24 NOTE — Progress Notes (Signed)
Patient here for referral no complaints of pain or discomfort today.

## 2015-05-28 ENCOUNTER — Encounter: Payer: Self-pay | Admitting: *Deleted

## 2015-05-28 ENCOUNTER — Inpatient Hospital Stay (HOSPITAL_BASED_OUTPATIENT_CLINIC_OR_DEPARTMENT_OTHER): Payer: BLUE CROSS/BLUE SHIELD | Admitting: Internal Medicine

## 2015-05-28 ENCOUNTER — Other Ambulatory Visit: Payer: Self-pay | Admitting: Radiology

## 2015-05-28 ENCOUNTER — Encounter: Payer: Self-pay | Admitting: Internal Medicine

## 2015-05-28 ENCOUNTER — Ambulatory Visit
Admission: RE | Admit: 2015-05-28 | Discharge: 2015-05-28 | Disposition: A | Discharge status: Home / Self Care | Payer: BLUE CROSS/BLUE SHIELD | Source: Ambulatory Visit | Attending: Radiation Oncology | Admitting: Radiation Oncology

## 2015-05-28 ENCOUNTER — Inpatient Hospital Stay: Payer: BLUE CROSS/BLUE SHIELD

## 2015-05-28 VITALS — BP 116/83 | HR 91 | Temp 98.0°F | Resp 18 | Ht 67.0 in | Wt 135.0 lb

## 2015-05-28 DIAGNOSIS — C3411 Malignant neoplasm of upper lobe, right bronchus or lung: Secondary | ICD-10-CM

## 2015-05-28 DIAGNOSIS — Z510 Encounter for antineoplastic radiation therapy: Secondary | ICD-10-CM | POA: Diagnosis not present

## 2015-05-28 DIAGNOSIS — C7931 Secondary malignant neoplasm of brain: Secondary | ICD-10-CM

## 2015-05-28 DIAGNOSIS — C7951 Secondary malignant neoplasm of bone: Secondary | ICD-10-CM

## 2015-05-28 DIAGNOSIS — M25551 Pain in right hip: Secondary | ICD-10-CM

## 2015-05-28 DIAGNOSIS — R7989 Other specified abnormal findings of blood chemistry: Secondary | ICD-10-CM

## 2015-05-28 DIAGNOSIS — Z79899 Other long term (current) drug therapy: Secondary | ICD-10-CM

## 2015-05-28 DIAGNOSIS — C3412 Malignant neoplasm of upper lobe, left bronchus or lung: Secondary | ICD-10-CM

## 2015-05-28 DIAGNOSIS — I10 Essential (primary) hypertension: Secondary | ICD-10-CM

## 2015-05-28 NOTE — Progress Notes (Signed)
Guardant testing performed today and sent out via fedex (tracking # [id number])  Gilotrif [facility]. Pt was instructed today to continue taking the iressa until new drug Gilotrif shipment is received. Patient will start radiation therapy on 06/11/15

## 2015-05-28 NOTE — Progress Notes (Signed)
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  He has intermittent pain is right hip; this is not any worse. Continues to deny  any new chest pain or shortness of breath or cough or hemoptysis.  Patient has met with Dr. CDonella Staderadiation oncology; plan to do radiation 10 fractions to the right lung lesion.   REVIEW OF SYSTEMS:  A complete 10 point review of system is done which is negative except mentioned above/history of present illness.   PAST MEDICAL HISTORY :  Past Medical History  Diagnosis Date  . HTN (hypertension)   [place]    PAST SURGICAL HISTORY :   Past Surgical History  Procedure Laterality Date  . Laminectomy N/A 03/07/2014    Procedure: T1-T2 Laminectomy with Decompression of Cord and Removal of Cancer  thoracic one/two;  Surgeon: HKristeen Miss MD;  [facility];  Service: Neurosurgery;  Laterality: N/A;    FAMILY HISTORY :   Family History  Problem Relation Age of Onset  . Cancer Mother   . Heart disease Father   . Hypertension Sister   . Hypertension Brother     SOCIAL HISTORY:   Social History  Substance Use Topics  . Smoking status: Never Smoker   . Smokeless tobacco: Never  Used  . Alcohol Use: 0.5 oz/week    1 Standard drinks or equivalent per week    ALLERGIES:  has No Known Allergies.  MEDICATIONS:  Current Outpatient Prescriptions  Medication Sig Dispense Refill  . afatinib dimaleate (GILOTRIF) 40 MG tablet Take 1 tablet (40 mg total) by mouth daily. Take on an empty stomach 1hr before or 2 hrs after meals. Do NOT start until okay with doc. 30 tablet 6  . gefitinib (IRESSA) 250 MG tablet Take 1 tablet (250 mg total) by mouth daily. 30 tablet 3  . hydrochlorothiazide (HYDRODIURIL) 25 MG tablet Take 25 mg by mouth daily.    . metoprolol succinate (TOPROL-XL) 50 MG 24 hr tablet Take 50 mg by mouth at bedtime.    . ondansetron (ZOFRAN ODT) 4 MG disintegrating tablet Take 1 tablet (4 mg total) by mouth every 8 (eight) hours as needed for nausea or vomiting. 60 tablet 1  . potassium chloride (K-DUR,KLOR-CON) 10 MEQ tablet Take 1 tablet (10 mEq total) by mouth 2 (two) times daily. 60 tablet 3   No current facility-administered medications for this visit.    PHYSICAL EXAMINATION: ECOG PERFORMANCE STATUS: 1 - Symptomatic but completely ambulatory  There were no vitals taken for this visit.  There were no vitals filed for this visit.  GENERAL: Well-nourished well-developed; Alert, no distress and comfortable.    EYES: no pallor or icterus. He is alone.   LABORATORY DATA:  I have  reviewed the data as listed    Component Value Date/Time   NA 136 05/23/2015 1346   NA 141 11/08/2014 1505   K 3.9 05/23/2015 1346   K 3.7 11/08/2014 1505   CL 103 05/23/2015 1346   CL 106 11/08/2014 1505   CO2 25 05/23/2015 1346   CO2 29 11/08/2014 1505   GLUCOSE 93 05/23/2015 1346   GLUCOSE 75 11/08/2014 1505   BUN 13 05/23/2015 1346   BUN 13 11/08/2014 1505   CREATININE 0.88 05/23/2015 1346   CREATININE 1.15 11/08/2014 1505   CALCIUM 8.8* 05/23/2015 1346   CALCIUM 9.1 11/08/2014 1505   PROT 7.9 05/23/2015 1346   PROT 7.7 11/08/2014 1505   ALBUMIN 3.6 05/23/2015  1346   ALBUMIN 4.0 11/08/2014 1505   AST 60* 05/23/2015 1346   AST 31 11/08/2014 1505   ALT 102* 05/23/2015 1346   ALT 53 11/08/2014 1505   ALKPHOS 117 05/23/2015 1346   ALKPHOS 62 11/08/2014 1505   BILITOT 0.4 05/23/2015 1346   BILITOT 0.6 11/08/2014 1505   GFRNONAA >60 05/23/2015 1346   GFRNONAA >60 11/08/2014 1505   GFRNONAA >60 08/01/2014 1023   GFRAA >60 05/23/2015 1346   GFRAA >60 11/08/2014 1505   GFRAA >60 08/01/2014 1023    No results found for: SPEP, UPEP  Lab Results  Component Value Date   WBC 6.7 05/23/2015   NEUTROABS 4.0 05/23/2015   HGB 13.3 05/23/2015   HCT 41.7 05/23/2015   MCV 75.6* 05/23/2015   PLT 321 05/23/2015      Chemistry      Component Value Date/Time   NA 136 05/23/2015 1346   NA 141 11/08/2014 1505   K 3.9 05/23/2015 1346   K 3.7 11/08/2014 1505   CL 103 05/23/2015 1346   CL 106 11/08/2014 1505   CO2 25 05/23/2015 1346   CO2 29 11/08/2014 1505   BUN 13 05/23/2015 1346   BUN 13 11/08/2014 1505   CREATININE 0.88 05/23/2015 1346   CREATININE 1.15 11/08/2014 1505      Component Value Date/Time   CALCIUM 8.8* 05/23/2015 1346   CALCIUM 9.1 11/08/2014 1505   ALKPHOS 117 05/23/2015 1346   ALKPHOS 62 11/08/2014 1505   AST 60* 05/23/2015 1346   AST 31 11/08/2014 1505   ALT 102* 05/23/2015 1346   ALT 53 11/08/2014 1505   BILITOT 0.4 05/23/2015 1346   BILITOT 0.6 11/08/2014 1505       RADIOGRAPHIC STUDIES: CT scan reviewed as discussed below  ASSESSMENT & PLAN:   # Metastatic adenocarcinoma of the lung with EGFR mutation/currently on first an therapy with Iressa. Most recent CT scan done on November 2016- shows mild-moderate progression of the-left upper lobe lung mass at 3 x 2.7 cm; previously 2.4 and 1.5 cm. Also new sclerotic lesions in the right sacrum; and also L4 vertebral body.  As discussed at last visit, plan to switch the patient to afatinib; and also had radiation to the lung lesion. We'll get liquid lung biopsy- to see if  patient has developed T790M mutation.   # Multiple 4-5 mm brain lesions on MRI- asymptomatic. Discussed multiple options including brain irradiation versus monitoring In about 8 weeks with an MRI; to see if afatinib could help shrink the lesions. Patient had prior whole brain  #Mild LFT elevation grade 1- likely from Iressa; repeat labs in 2 weeks.   # Also given the mild right-sided pelvic pain- not on any pain medication; if it gets worse trial  of narcotics/and also palliative radiation. For now continue X-geva every 4 weeks.  I reviewed the scans myself reviewed the images with the patient in detail. He was given a copy of this MRI.  The above plan of care was discussed with the patient in detail. All questions were answered.   I spent 25 minutes counseling the patient face to face. The total time spent in the appointment was  30 minutes and more than 50% was on counseling and review of test results     Cammie Sickle, MD 05/28/2015 10:54 AM

## 2015-05-29 ENCOUNTER — Ambulatory Visit: Admission: RE | Admit: 2015-05-29 | Payer: BLUE CROSS/BLUE SHIELD | Source: Ambulatory Visit

## 2015-05-29 ENCOUNTER — Ambulatory Visit
Admission: RE | Admit: 2015-05-29 | Discharge: 2015-05-29 | Disposition: A | Discharge status: Home / Self Care | Payer: BLUE CROSS/BLUE SHIELD | Source: Ambulatory Visit | Attending: Internal Medicine | Admitting: Internal Medicine

## 2015-05-29 ENCOUNTER — Encounter: Payer: Self-pay | Admitting: Internal Medicine

## 2015-05-29 ENCOUNTER — Ambulatory Visit
Admission: RE | Admit: 2015-05-29 | Discharge: 2015-05-29 | Disposition: A | Discharge status: Home / Self Care | Payer: BLUE CROSS/BLUE SHIELD | Source: Ambulatory Visit | Attending: Diagnostic Radiology | Admitting: Diagnostic Radiology

## 2015-05-29 DIAGNOSIS — Z01811 Encounter for preprocedural respiratory examination: Secondary | ICD-10-CM | POA: Diagnosis present

## 2015-05-29 DIAGNOSIS — Z01812 Encounter for preprocedural laboratory examination: Secondary | ICD-10-CM | POA: Diagnosis not present

## 2015-05-29 DIAGNOSIS — Z9889 Other specified postprocedural states: Secondary | ICD-10-CM | POA: Insufficient documentation

## 2015-05-29 DIAGNOSIS — C3412 Malignant neoplasm of upper lobe, left bronchus or lung: Secondary | ICD-10-CM | POA: Insufficient documentation

## 2015-05-29 DIAGNOSIS — C3492 Malignant neoplasm of unspecified part of left bronchus or lung: Secondary | ICD-10-CM

## 2015-05-29 DIAGNOSIS — C7951 Secondary malignant neoplasm of bone: Secondary | ICD-10-CM | POA: Diagnosis not present

## 2015-05-29 LAB — PROTIME-INR
INR: 0.98
Prothrombin Time: 13.2 seconds (ref 11.4–15.0)

## 2015-05-29 LAB — APTT: aPTT: 29 seconds (ref 24–36)

## 2015-05-29 MED ORDER — HYDROCODONE-ACETAMINOPHEN 5-325 MG PO TABS: 1.0000 | ORAL_TABLET | ORAL | Status: DC | PRN

## 2015-05-29 MED ORDER — SODIUM CHLORIDE 0.9 % IV SOLN
Freq: Once | INTRAVENOUS | Status: AC
  Administered 2015-05-29: 09:00:00 via INTRAVENOUS

## 2015-05-29 NOTE — Procedures (Signed)
CT biopsy of left lung mass  Complications:  None  Blood Loss: none  See dictation in canopy pacs

## 2015-05-30 DIAGNOSIS — C3412 Malignant neoplasm of upper lobe, left bronchus or lung: Secondary | ICD-10-CM | POA: Diagnosis not present

## 2015-06-04 ENCOUNTER — Telehealth: Payer: Self-pay | Admitting: *Deleted

## 2015-06-04 NOTE — Telephone Encounter (Signed)
Called pharmacy to obtain status of new oral chemo prescription.  Spoke with Arbie Cookey who verified they do not have faxed prescription from [place].  Gave new fax number for chemo pharmacy - [phone].  Refaxed prescription.

## 2015-06-11 ENCOUNTER — Ambulatory Visit
Admission: RE | Admit: 2015-06-11 | Discharge: 2015-06-11 | Disposition: A | Discharge status: Home / Self Care | Payer: BLUE CROSS/BLUE SHIELD | Source: Ambulatory Visit | Attending: Radiation Oncology | Admitting: Radiation Oncology

## 2015-06-11 ENCOUNTER — Encounter: Payer: Self-pay | Admitting: *Deleted

## 2015-06-11 ENCOUNTER — Other Ambulatory Visit: Payer: Self-pay | Admitting: *Deleted

## 2015-06-11 ENCOUNTER — Telehealth: Payer: Self-pay | Admitting: Internal Medicine

## 2015-06-11 ENCOUNTER — Inpatient Hospital Stay: Payer: BLUE CROSS/BLUE SHIELD

## 2015-06-11 DIAGNOSIS — C3412 Malignant neoplasm of upper lobe, left bronchus or lung: Secondary | ICD-10-CM | POA: Diagnosis not present

## 2015-06-11 DIAGNOSIS — C3411 Malignant neoplasm of upper lobe, right bronchus or lung: Secondary | ICD-10-CM

## 2015-06-11 DIAGNOSIS — K1230 Oral mucositis (ulcerative), unspecified: Secondary | ICD-10-CM

## 2015-06-11 DIAGNOSIS — C3492 Malignant neoplasm of unspecified part of left bronchus or lung: Secondary | ICD-10-CM

## 2015-06-11 LAB — CBC
HCT: 39.8 % — ABNORMAL LOW (ref 40.0–52.0)
Hemoglobin: 12.8 g/dL — ABNORMAL LOW (ref 13.0–18.0)
MCH: 23.8 pg — AB (ref 26.0–34.0)
MCHC: 32.2 g/dL (ref 32.0–36.0)
MCV: 73.8 fL — AB (ref 80.0–100.0)
PLATELETS: 377 10*3/uL (ref 150–440)
RBC: 5.38 MIL/uL (ref 4.40–5.90)
RDW: 15.8 % — AB (ref 11.5–14.5)
WBC: 9 10*3/uL (ref 3.8–10.6)

## 2015-06-11 LAB — HEPATIC FUNCTION PANEL
ALBUMIN: 3.3 g/dL — AB (ref 3.5–5.0)
ALK PHOS: 222 U/L — AB (ref 38–126)
ALT: 225 U/L — AB (ref 17–63)
AST: 183 U/L — AB (ref 15–41)
BILIRUBIN TOTAL: 0.9 mg/dL (ref 0.3–1.2)
Bilirubin, Direct: 0.3 mg/dL (ref 0.1–0.5)
Indirect Bilirubin: 0.6 mg/dL (ref 0.3–0.9)
Total Protein: 8.7 g/dL — ABNORMAL HIGH (ref 6.5–8.1)

## 2015-06-11 MED ORDER — MAGIC MOUTHWASH W/LIDOCAINE: 5.0000 mL | Freq: Four times a day (QID) | ORAL | Status: DC

## 2015-06-11 NOTE — Progress Notes (Signed)
Patient came to nursing station with complaints of "daily diarrhea episodes since last Wednesday/Thursday." "I have 1-2 loose stools every day and It hurts to swallow food. I feel like my esophagus is sore. I'm taking imodium but only 1 tablet a day. I feel ok. I'm just a little tired. What else can I do. Patient started received Gilotrif 40 mg tablet on Friday and started this tablet at that time.  Patient given instructions to take 4 mg of imodium at the onset of the first loose stool and 2 mg of imodium for every subsequent loose stool. The max amount of imodium is '8mg'$  in 24 hours.  Should this not improve his symptoms, he is to call our office.  Dr. Rogue Bussing sent RX forMagic mouth wash sent to patient's pharmacy to aid the mucositis.  Pt also educated on the [employer] for diarrhea.

## 2015-06-11 NOTE — Telephone Encounter (Signed)
Elevated LFTs- recommend holding GILTORIF [just started taking 3 days prior]. Repeat LFts in 1 week. Symptom management for diarrhea.

## 2015-06-11 NOTE — Telephone Encounter (Signed)
Patient called and asked to hold the giltorif. He was given an appointment to have labs checked again next Monday. Teach back process performed with pt.

## 2015-06-12 ENCOUNTER — Ambulatory Visit: Payer: BLUE CROSS/BLUE SHIELD | Admitting: Radiation Oncology

## 2015-06-12 ENCOUNTER — Ambulatory Visit
Admission: RE | Admit: 2015-06-12 | Discharge: 2015-06-12 | Disposition: A | Discharge status: Home / Self Care | Payer: BLUE CROSS/BLUE SHIELD | Source: Ambulatory Visit | Attending: Radiation Oncology | Admitting: Radiation Oncology

## 2015-06-12 DIAGNOSIS — C3412 Malignant neoplasm of upper lobe, left bronchus or lung: Secondary | ICD-10-CM | POA: Diagnosis not present

## 2015-06-13 ENCOUNTER — Ambulatory Visit: Payer: BLUE CROSS/BLUE SHIELD

## 2015-06-13 ENCOUNTER — Ambulatory Visit
Admission: RE | Admit: 2015-06-13 | Discharge: 2015-06-13 | Disposition: A | Discharge status: Home / Self Care | Payer: BLUE CROSS/BLUE SHIELD | Source: Ambulatory Visit | Attending: Radiation Oncology | Admitting: Radiation Oncology

## 2015-06-13 DIAGNOSIS — C3412 Malignant neoplasm of upper lobe, left bronchus or lung: Secondary | ICD-10-CM | POA: Diagnosis not present

## 2015-06-14 ENCOUNTER — Ambulatory Visit
Admission: RE | Admit: 2015-06-14 | Discharge: 2015-06-14 | Disposition: A | Discharge status: Home / Self Care | Payer: BLUE CROSS/BLUE SHIELD | Source: Ambulatory Visit | Attending: Radiation Oncology | Admitting: Radiation Oncology

## 2015-06-14 ENCOUNTER — Ambulatory Visit: Payer: BLUE CROSS/BLUE SHIELD

## 2015-06-14 DIAGNOSIS — C3412 Malignant neoplasm of upper lobe, left bronchus or lung: Secondary | ICD-10-CM | POA: Diagnosis not present

## 2015-06-15 ENCOUNTER — Ambulatory Visit: Payer: BLUE CROSS/BLUE SHIELD

## 2015-06-15 ENCOUNTER — Encounter: Payer: Self-pay | Admitting: Internal Medicine

## 2015-06-15 ENCOUNTER — Ambulatory Visit
Admission: RE | Admit: 2015-06-15 | Discharge: 2015-06-15 | Disposition: A | Discharge status: Home / Self Care | Payer: BLUE CROSS/BLUE SHIELD | Source: Ambulatory Visit | Attending: Radiation Oncology | Admitting: Radiation Oncology

## 2015-06-15 DIAGNOSIS — C3412 Malignant neoplasm of upper lobe, left bronchus or lung: Secondary | ICD-10-CM | POA: Diagnosis not present

## 2015-06-18 ENCOUNTER — Ambulatory Visit
Admission: RE | Admit: 2015-06-18 | Discharge: 2015-06-18 | Disposition: A | Discharge status: Home / Self Care | Payer: BLUE CROSS/BLUE SHIELD | Source: Ambulatory Visit | Attending: Radiation Oncology | Admitting: Radiation Oncology

## 2015-06-18 ENCOUNTER — Ambulatory Visit: Payer: BLUE CROSS/BLUE SHIELD

## 2015-06-18 ENCOUNTER — Inpatient Hospital Stay: Payer: BLUE CROSS/BLUE SHIELD | Attending: Internal Medicine

## 2015-06-18 DIAGNOSIS — C3412 Malignant neoplasm of upper lobe, left bronchus or lung: Secondary | ICD-10-CM | POA: Insufficient documentation

## 2015-06-18 DIAGNOSIS — I10 Essential (primary) hypertension: Secondary | ICD-10-CM | POA: Insufficient documentation

## 2015-06-18 DIAGNOSIS — R197 Diarrhea, unspecified: Secondary | ICD-10-CM | POA: Insufficient documentation

## 2015-06-18 DIAGNOSIS — Z923 Personal history of irradiation: Secondary | ICD-10-CM | POA: Insufficient documentation

## 2015-06-18 DIAGNOSIS — C7931 Secondary malignant neoplasm of brain: Secondary | ICD-10-CM | POA: Insufficient documentation

## 2015-06-18 DIAGNOSIS — R112 Nausea with vomiting, unspecified: Secondary | ICD-10-CM | POA: Insufficient documentation

## 2015-06-18 DIAGNOSIS — R7989 Other specified abnormal findings of blood chemistry: Secondary | ICD-10-CM | POA: Insufficient documentation

## 2015-06-18 DIAGNOSIS — Z79899 Other long term (current) drug therapy: Secondary | ICD-10-CM | POA: Insufficient documentation

## 2015-06-18 DIAGNOSIS — C7951 Secondary malignant neoplasm of bone: Secondary | ICD-10-CM | POA: Insufficient documentation

## 2015-06-18 DIAGNOSIS — R5382 Chronic fatigue, unspecified: Secondary | ICD-10-CM | POA: Insufficient documentation

## 2015-06-18 NOTE — Progress Notes (Unsigned)
PSN met with patient to discuss issues regarding Medicaid and his [employer].

## 2015-06-19 ENCOUNTER — Other Ambulatory Visit: Payer: Self-pay | Admitting: *Deleted

## 2015-06-19 ENCOUNTER — Other Ambulatory Visit: Payer: BLUE CROSS/BLUE SHIELD

## 2015-06-19 ENCOUNTER — Ambulatory Visit
Admission: RE | Admit: 2015-06-19 | Discharge: 2015-06-19 | Disposition: A | Discharge status: Home / Self Care | Payer: BLUE CROSS/BLUE SHIELD | Source: Ambulatory Visit | Attending: Radiation Oncology | Admitting: Radiation Oncology

## 2015-06-19 ENCOUNTER — Ambulatory Visit: Payer: BLUE CROSS/BLUE SHIELD

## 2015-06-19 ENCOUNTER — Telehealth: Payer: Self-pay | Admitting: *Deleted

## 2015-06-19 ENCOUNTER — Inpatient Hospital Stay: Payer: BLUE CROSS/BLUE SHIELD

## 2015-06-19 DIAGNOSIS — C3412 Malignant neoplasm of upper lobe, left bronchus or lung: Secondary | ICD-10-CM | POA: Diagnosis present

## 2015-06-19 DIAGNOSIS — C7931 Secondary malignant neoplasm of brain: Secondary | ICD-10-CM | POA: Diagnosis not present

## 2015-06-19 DIAGNOSIS — Z85118 Personal history of other malignant neoplasm of bronchus and lung: Secondary | ICD-10-CM

## 2015-06-19 DIAGNOSIS — C3492 Malignant neoplasm of unspecified part of left bronchus or lung: Secondary | ICD-10-CM

## 2015-06-19 DIAGNOSIS — R7989 Other specified abnormal findings of blood chemistry: Secondary | ICD-10-CM | POA: Diagnosis not present

## 2015-06-19 DIAGNOSIS — R5382 Chronic fatigue, unspecified: Secondary | ICD-10-CM | POA: Diagnosis not present

## 2015-06-19 DIAGNOSIS — Z923 Personal history of irradiation: Secondary | ICD-10-CM | POA: Diagnosis not present

## 2015-06-19 DIAGNOSIS — R112 Nausea with vomiting, unspecified: Secondary | ICD-10-CM | POA: Diagnosis not present

## 2015-06-19 DIAGNOSIS — C7951 Secondary malignant neoplasm of bone: Secondary | ICD-10-CM | POA: Diagnosis not present

## 2015-06-19 DIAGNOSIS — Z79899 Other long term (current) drug therapy: Secondary | ICD-10-CM | POA: Diagnosis not present

## 2015-06-19 DIAGNOSIS — R748 Abnormal levels of other serum enzymes: Secondary | ICD-10-CM

## 2015-06-19 DIAGNOSIS — R197 Diarrhea, unspecified: Secondary | ICD-10-CM | POA: Diagnosis not present

## 2015-06-19 DIAGNOSIS — I10 Essential (primary) hypertension: Secondary | ICD-10-CM | POA: Diagnosis not present

## 2015-06-19 LAB — CBC
HCT: 39.1 % — ABNORMAL LOW (ref 40.0–52.0)
Hemoglobin: 12.5 g/dL — ABNORMAL LOW (ref 13.0–18.0)
MCH: 23.8 pg — AB (ref 26.0–34.0)
MCHC: 32 g/dL (ref 32.0–36.0)
MCV: 74.2 fL — ABNORMAL LOW (ref 80.0–100.0)
PLATELETS: 410 10*3/uL (ref 150–440)
RBC: 5.27 MIL/uL (ref 4.40–5.90)
RDW: 15.8 % — ABNORMAL HIGH (ref 11.5–14.5)
WBC: 7.2 10*3/uL (ref 3.8–10.6)

## 2015-06-19 LAB — HEPATIC FUNCTION PANEL
ALBUMIN: 3.2 g/dL — AB (ref 3.5–5.0)
ALT: 69 U/L — AB (ref 17–63)
AST: 29 U/L (ref 15–41)
Alkaline Phosphatase: 163 U/L — ABNORMAL HIGH (ref 38–126)
BILIRUBIN TOTAL: 0.3 mg/dL (ref 0.3–1.2)
Total Protein: 8.1 g/dL (ref 6.5–8.1)

## 2015-06-19 NOTE — Telephone Encounter (Signed)
Patient came for lab today. Liver function has improved. Will ask Dr. Rogue Bussing about MD recommendations to restart Gilotrif.

## 2015-06-20 ENCOUNTER — Ambulatory Visit
Admission: RE | Admit: 2015-06-20 | Discharge: 2015-06-20 | Disposition: A | Discharge status: Home / Self Care | Payer: BLUE CROSS/BLUE SHIELD | Source: Ambulatory Visit | Attending: Radiation Oncology | Admitting: Radiation Oncology

## 2015-06-20 ENCOUNTER — Ambulatory Visit: Payer: BLUE CROSS/BLUE SHIELD

## 2015-06-20 DIAGNOSIS — C3412 Malignant neoplasm of upper lobe, left bronchus or lung: Secondary | ICD-10-CM | POA: Diagnosis not present

## 2015-06-20 NOTE — Telephone Encounter (Signed)
Spoke with patient. MD asked patient to hold the Gilotrif for another week due to elevated liver enzymes. Teach back process performed. I asked patient to come next Monday at 145pm for lab/md.

## 2015-06-21 ENCOUNTER — Ambulatory Visit
Admission: RE | Admit: 2015-06-21 | Discharge: 2015-06-21 | Disposition: A | Discharge status: Home / Self Care | Payer: BLUE CROSS/BLUE SHIELD | Source: Ambulatory Visit | Attending: Radiation Oncology | Admitting: Radiation Oncology

## 2015-06-21 ENCOUNTER — Ambulatory Visit: Payer: BLUE CROSS/BLUE SHIELD

## 2015-06-21 DIAGNOSIS — C3412 Malignant neoplasm of upper lobe, left bronchus or lung: Secondary | ICD-10-CM | POA: Diagnosis not present

## 2015-06-22 ENCOUNTER — Ambulatory Visit: Payer: BLUE CROSS/BLUE SHIELD

## 2015-06-22 ENCOUNTER — Ambulatory Visit
Admission: RE | Admit: 2015-06-22 | Discharge: 2015-06-22 | Disposition: A | Discharge status: Home / Self Care | Payer: BLUE CROSS/BLUE SHIELD | Source: Ambulatory Visit | Attending: Radiation Oncology | Admitting: Radiation Oncology

## 2015-06-25 ENCOUNTER — Inpatient Hospital Stay (HOSPITAL_BASED_OUTPATIENT_CLINIC_OR_DEPARTMENT_OTHER): Payer: BLUE CROSS/BLUE SHIELD | Admitting: Internal Medicine

## 2015-06-25 ENCOUNTER — Inpatient Hospital Stay: Payer: BLUE CROSS/BLUE SHIELD

## 2015-06-25 ENCOUNTER — Ambulatory Visit: Payer: BLUE CROSS/BLUE SHIELD | Admitting: Internal Medicine

## 2015-06-25 ENCOUNTER — Other Ambulatory Visit: Payer: BLUE CROSS/BLUE SHIELD

## 2015-06-25 ENCOUNTER — Ambulatory Visit: Payer: BLUE CROSS/BLUE SHIELD

## 2015-06-25 VITALS — BP 130/88 | HR 114 | Ht 67.0 in | Wt 136.5 lb

## 2015-06-25 DIAGNOSIS — R112 Nausea with vomiting, unspecified: Secondary | ICD-10-CM

## 2015-06-25 DIAGNOSIS — Z923 Personal history of irradiation: Secondary | ICD-10-CM

## 2015-06-25 DIAGNOSIS — R7989 Other specified abnormal findings of blood chemistry: Secondary | ICD-10-CM

## 2015-06-25 DIAGNOSIS — Z79899 Other long term (current) drug therapy: Secondary | ICD-10-CM

## 2015-06-25 DIAGNOSIS — C7951 Secondary malignant neoplasm of bone: Secondary | ICD-10-CM

## 2015-06-25 DIAGNOSIS — C3492 Malignant neoplasm of unspecified part of left bronchus or lung: Secondary | ICD-10-CM

## 2015-06-25 DIAGNOSIS — C7931 Secondary malignant neoplasm of brain: Secondary | ICD-10-CM

## 2015-06-25 DIAGNOSIS — R5382 Chronic fatigue, unspecified: Secondary | ICD-10-CM

## 2015-06-25 DIAGNOSIS — R748 Abnormal levels of other serum enzymes: Secondary | ICD-10-CM

## 2015-06-25 DIAGNOSIS — C3412 Malignant neoplasm of upper lobe, left bronchus or lung: Secondary | ICD-10-CM

## 2015-06-25 DIAGNOSIS — I10 Essential (primary) hypertension: Secondary | ICD-10-CM

## 2015-06-25 LAB — COMPREHENSIVE METABOLIC PANEL
ALBUMIN: 3.6 g/dL (ref 3.5–5.0)
ALT: 40 U/L (ref 17–63)
AST: 34 U/L (ref 15–41)
Alkaline Phosphatase: 171 U/L — ABNORMAL HIGH (ref 38–126)
Anion gap: 8 (ref 5–15)
BUN: 15 mg/dL (ref 6–20)
CHLORIDE: 97 mmol/L — AB (ref 101–111)
CO2: 26 mmol/L (ref 22–32)
CREATININE: 0.98 mg/dL (ref 0.61–1.24)
Calcium: 8.8 mg/dL — ABNORMAL LOW (ref 8.9–10.3)
GFR calc Af Amer: >60 mL/min (ref >60)
GLUCOSE: 113 mg/dL — AB (ref 65–99)
Potassium: 3.2 mmol/L — ABNORMAL LOW (ref 3.5–5.1)
Sodium: 131 mmol/L — ABNORMAL LOW (ref 135–145)
Total Bilirubin: 0.4 mg/dL (ref 0.3–1.2)
Total Protein: 8.2 g/dL — ABNORMAL HIGH (ref 6.5–8.1)

## 2015-06-25 LAB — CBC WITH DIFFERENTIAL/PLATELET
BASOS ABS: 0.1 10*3/uL (ref 0–0.1)
BASOS PCT: 1 %
EOS PCT: 3 %
Eosinophils Absolute: 0.3 10*3/uL (ref 0–0.7)
HEMATOCRIT: 41.1 % (ref 40.0–52.0)
Hemoglobin: 13.2 g/dL (ref 13.0–18.0)
LYMPHS PCT: 18 %
Lymphs Abs: 1.7 10*3/uL (ref 1.0–3.6)
MCH: 23.7 pg — ABNORMAL LOW (ref 26.0–34.0)
MCHC: 32.2 g/dL (ref 32.0–36.0)
MCV: 73.7 fL — AB (ref 80.0–100.0)
Monocytes Absolute: 0.7 10*3/uL (ref 0.2–1.0)
Monocytes Relative: 7 %
NEUTROS ABS: 6.6 10*3/uL — AB (ref 1.4–6.5)
Neutrophils Relative %: 71 %
PLATELETS: 356 10*3/uL (ref 150–440)
RBC: 5.58 MIL/uL (ref 4.40–5.90)
RDW: 16.6 % — ABNORMAL HIGH (ref 11.5–14.5)
WBC: 9.3 10*3/uL (ref 3.8–10.6)

## 2015-06-25 MED ORDER — OSIMERTINIB MESYLATE 80 MG PO TABS: 80.0000 mg | ORAL_TABLET | Freq: Every day | ORAL | Status: DC

## 2015-06-25 MED ORDER — PROCHLORPERAZINE MALEATE 10 MG PO TABS: 10.0000 mg | ORAL_TABLET | Freq: Four times a day (QID) | ORAL | Status: DC | PRN

## 2015-06-25 NOTE — Progress Notes (Signed)
[place] OFFICE PROGRESS NOTE  Patient Care Team: Leia Alf, MD as PCP - General (Internal Medicine)   SUMMARY OF ONCOLOGIC HISTORY:  # AUG 2015-  METASTATIC ADENO CA LUNG POSITIVE for EGFR Exon 21 [id number] [neg- ALK;[name];[name],MET]; [name]; NOV [id number]- CT- Progression [RUL & Pelvic sclerotic lesions];  [name];[name]- Plan Tagrisso  # AUG 2015- Thoracic spine mets s/p decompression & Cerebellar brain met s/p RT; NOV 2016- MRI BRAIN-PROGRESS-NEW multiple brain lesions [~26m]  # Bony mets- on X-geva q 6 w   INTERVAL HISTORY:  49year old male patient with above history of 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  In the interim patient had liquid biopsy/lung biopsy. Patient also finished radiation to the right upper lung. Also patient's LFTs have been elevated-likely secondary to Iressa. Afatinib is on hold because of the elevated LFTs.  He complains of intermittent nausea with vomiting. Complains of chronic fatigue. No abdominal pain. No worsening pain. No seizures.   REVIEW OF SYSTEMS:  A complete 10 point review of system is done which is negative except mentioned above/history of present illness.   PAST MEDICAL HISTORY :  Past Medical History  Diagnosis Date  . HTN (hypertension)   . [place]en[place] 12/05/2014    PAST SURGICAL HISTORY :   Past Surgical History  Procedure Laterality Date  . Laminectomy N/A 03/07/2014    Procedure: T1-T2 Laminectomy with Decompression of Cord and Removal of Cancer  thoracic one/two;  Surgeon: HKristeen Miss MD;  [facility];  Service: Neurosurgery;  Laterality: N/A;    FAMILY HISTORY :   Family History  Problem Relation Age of Onset  . Cancer Mother   . Heart disease Father   . Hypertension Sister   . Hypertension Brother     SOCIAL HISTORY:    Social History  Substance Use Topics  . Smoking status: Never Smoker   . Smokeless tobacco: Never Used  . Alcohol Use: No     Comment: none for a year    ALLERGIES:  has No Known Allergies.  MEDICATIONS:  Current Outpatient Prescriptions  Medication Sig Dispense Refill  . hydrochlorothiazide (HYDRODIURIL) 25 MG tablet Take 25 mg by mouth daily.    . metoprolol succinate (TOPROL-XL) 50 MG 24 hr tablet Take 50 mg by mouth at bedtime.    . potassium chloride (K-DUR,KLOR-CON) 10 MEQ tablet Take 1 tablet (10 mEq total) by mouth 2 (two) times daily. 60 tablet 3   No current facility-administered medications for this visit.    PHYSICAL EXAMINATION: ECOG PERFORMANCE STATUS: 1 - Symptomatic but completely ambulatory  BP 130/88 mmHg  Pulse 114  Ht 5' 7"  (1.702 m)  Wt 136 lb 7.4 oz (61.9 kg)  BMI 21.37 kg/m2   GENERAL: Well-nourished well-developed; Alert, no distress and comfortable. Accompanied by his wife. EYES: no pallor or icterus OROPHARYNX: no thrush or ulceration; good dentition  NECK: supple, no masses felt LYMPH: no palpable lymphadenopathy in the cervical, axillary or inguinal regions LUNGS: clear to auscultation and No wheeze or crackles HEART/CVS: regular rate & rhythm and no murmurs; No lower extremity edema ABDOMEN:abdomen soft, non-tender and normal bowel sounds Musculoskeletal:no cyanosis of digits and no clubbing  PSYCH: alert & oriented x 3 with fluent speech NEURO: no focal motor/sensory deficits SKIN: no rashes or significant lesions  FAutoliv  06/25/15 1403  Weight: 136 lb 7.4 oz (61.9 kg)    LABORATORY DATA:  I have reviewed the data as listed    Component Value Date/Time   NA 131* 06/25/2015 1339   NA 141 11/08/2014 1505   K 3.2* 06/25/2015 1339   K 3.7 11/08/2014 1505   CL 97* 06/25/2015 1339   CL 106 11/08/2014 1505   CO2 26 06/25/2015 1339   CO2 29 11/08/2014 1505   GLUCOSE 113* 06/25/2015 1339   GLUCOSE 75 11/08/2014 1505    BUN 15 06/25/2015 1339   BUN 13 11/08/2014 1505   CREATININE 0.98 06/25/2015 1339   CREATININE 1.15 11/08/2014 1505   CALCIUM 8.8* 06/25/2015 1339   CALCIUM 9.1 11/08/2014 1505   PROT 8.2* 06/25/2015 1339   PROT 7.7 11/08/2014 1505   ALBUMIN 3.6 06/25/2015 1339   ALBUMIN 4.0 11/08/2014 1505   AST 34 06/25/2015 1339   AST 31 11/08/2014 1505   ALT 40 06/25/2015 1339   ALT 53 11/08/2014 1505   ALKPHOS 171* 06/25/2015 1339   ALKPHOS 62 11/08/2014 1505   BILITOT 0.4 06/25/2015 1339   BILITOT 0.6 11/08/2014 1505   GFRNONAA >60 06/25/2015 1339   GFRNONAA >60 11/08/2014 1505   GFRNONAA >60 08/01/2014 1023   GFRAA >60 06/25/2015 1339   GFRAA >60 11/08/2014 1505   GFRAA >60 08/01/2014 1023    No results found for: SPEP, UPEP  Lab Results  Component Value Date   WBC 9.3 06/25/2015   NEUTROABS 6.6* 06/25/2015   HGB 13.2 06/25/2015   HCT 41.1 06/25/2015   MCV 73.7* 06/25/2015   PLT 356 06/25/2015      Chemistry      Component Value Date/Time   NA 131* 06/25/2015 1339   NA 141 11/08/2014 1505   K 3.2* 06/25/2015 1339   K 3.7 11/08/2014 1505   CL 97* 06/25/2015 1339   CL 106 11/08/2014 1505   CO2 26 06/25/2015 1339   CO2 29 11/08/2014 1505   BUN 15 06/25/2015 1339   BUN 13 11/08/2014 1505   CREATININE 0.98 06/25/2015 1339   CREATININE 1.15 11/08/2014 1505      Component Value Date/Time   CALCIUM 8.8* 06/25/2015 1339   CALCIUM 9.1 11/08/2014 1505   ALKPHOS 171* 06/25/2015 1339   ALKPHOS 62 11/08/2014 1505   AST 34 06/25/2015 1339   AST 31 11/08/2014 1505   ALT 40 06/25/2015 1339   ALT 53 11/08/2014 1505   BILITOT 0.4 06/25/2015 1339   BILITOT 0.6 11/08/2014 1505       RADIOGRAPHIC STUDIES: CT scan reviewed as discussed below  ASSESSMENT & PLAN:   # Metastatic adenocarcinoma of the lung with EGFR mutation/currently on SECOND LINE therapy with AFATINIB [started Nov 2016]- given the mild progression. However this is an hold- because of elevated LFTs [see  discussion below]  # Patient's lipid biopsy is positive for T790M-mutation. Recommend Tagrisso. Discussed the potential side effects including diarrhea; elevated blood sugars. New prescription initiated.  # Left upper lobe mass- status post radiation. We will plan to get a CT scan again in approximately 3 months.  # Nausea question radiation- patient was given Compazine. If worse we will recommend MRI of the brain.  # Elevated LFTs- likely secondary to Iressa. Come to normal. Recommend finishing of afatinib at this time. He has 27 pills available at this time.  # Bone lesions- changing denosumab to every 6 weeks.  # Patient will have labs done in 3 weeks; follow  up with me/labs in 6 weeks.  I would not recommend him traveling out of country at this time.   The above plan of care was discussed with the patient in detail. All questions were answered.    Cammie Sickle, MD 06/25/2015 2:15 PM

## 2015-07-02 ENCOUNTER — Inpatient Hospital Stay: Payer: BLUE CROSS/BLUE SHIELD

## 2015-07-02 ENCOUNTER — Inpatient Hospital Stay (HOSPITAL_BASED_OUTPATIENT_CLINIC_OR_DEPARTMENT_OTHER): Payer: BLUE CROSS/BLUE SHIELD | Admitting: Internal Medicine

## 2015-07-02 VITALS — BP 128/94 | HR 76 | Temp 97.0°F | Resp 18 | Ht 67.0 in | Wt 132.3 lb

## 2015-07-02 DIAGNOSIS — Z923 Personal history of irradiation: Secondary | ICD-10-CM | POA: Diagnosis not present

## 2015-07-02 DIAGNOSIS — R7989 Other specified abnormal findings of blood chemistry: Secondary | ICD-10-CM

## 2015-07-02 DIAGNOSIS — Z79899 Other long term (current) drug therapy: Secondary | ICD-10-CM

## 2015-07-02 DIAGNOSIS — R197 Diarrhea, unspecified: Secondary | ICD-10-CM

## 2015-07-02 DIAGNOSIS — C7931 Secondary malignant neoplasm of brain: Secondary | ICD-10-CM

## 2015-07-02 DIAGNOSIS — I10 Essential (primary) hypertension: Secondary | ICD-10-CM

## 2015-07-02 DIAGNOSIS — C7951 Secondary malignant neoplasm of bone: Secondary | ICD-10-CM | POA: Diagnosis not present

## 2015-07-02 DIAGNOSIS — C3492 Malignant neoplasm of unspecified part of left bronchus or lung: Secondary | ICD-10-CM

## 2015-07-02 DIAGNOSIS — C3412 Malignant neoplasm of upper lobe, left bronchus or lung: Secondary | ICD-10-CM

## 2015-07-02 DIAGNOSIS — C3491 Malignant neoplasm of unspecified part of right bronchus or lung: Secondary | ICD-10-CM

## 2015-07-02 DIAGNOSIS — R5382 Chronic fatigue, unspecified: Secondary | ICD-10-CM

## 2015-07-02 DIAGNOSIS — C3411 Malignant neoplasm of upper lobe, right bronchus or lung: Secondary | ICD-10-CM

## 2015-07-02 LAB — CBC WITH DIFFERENTIAL/PLATELET
BASOS ABS: 0 10*3/uL (ref 0–0.1)
BASOS PCT: 1 %
EOS ABS: 0.3 10*3/uL (ref 0–0.7)
Eosinophils Relative: 5 %
HCT: 39.8 % — ABNORMAL LOW (ref 40.0–52.0)
HEMOGLOBIN: 12.9 g/dL — AB (ref 13.0–18.0)
Lymphocytes Relative: 25 %
Lymphs Abs: 1.7 10*3/uL (ref 1.0–3.6)
MCH: 23.9 pg — ABNORMAL LOW (ref 26.0–34.0)
MCHC: 32.4 g/dL (ref 32.0–36.0)
MCV: 73.8 fL — ABNORMAL LOW (ref 80.0–100.0)
Monocytes Absolute: 0.6 10*3/uL (ref 0.2–1.0)
Monocytes Relative: 9 %
NEUTROS PCT: 60 %
Neutro Abs: 4.2 10*3/uL (ref 1.4–6.5)
PLATELETS: 339 10*3/uL (ref 150–440)
RBC: 5.4 MIL/uL (ref 4.40–5.90)
RDW: 16.6 % — ABNORMAL HIGH (ref 11.5–14.5)
WBC: 6.8 10*3/uL (ref 3.8–10.6)

## 2015-07-02 LAB — COMPREHENSIVE METABOLIC PANEL
ALBUMIN: 3.6 g/dL (ref 3.5–5.0)
ALK PHOS: 138 U/L — AB (ref 38–126)
ALT: 34 U/L (ref 17–63)
AST: 22 U/L (ref 15–41)
Anion gap: 6 (ref 5–15)
BUN: 11 mg/dL (ref 6–20)
CALCIUM: 8.9 mg/dL (ref 8.9–10.3)
CHLORIDE: 101 mmol/L (ref 101–111)
CO2: 28 mmol/L (ref 22–32)
CREATININE: 0.92 mg/dL (ref 0.61–1.24)
GFR calc Af Amer: >60 mL/min (ref >60)
GFR calc non Af Amer: >60 mL/min (ref >60)
GLUCOSE: 96 mg/dL (ref 65–99)
Potassium: 3.1 mmol/L — ABNORMAL LOW (ref 3.5–5.1)
SODIUM: 135 mmol/L (ref 135–145)
Total Bilirubin: 0.2 mg/dL — ABNORMAL LOW (ref 0.3–1.2)
Total Protein: 8.3 g/dL — ABNORMAL HIGH (ref 6.5–8.1)

## 2015-07-02 MED ORDER — DENOSUMAB 120 MG/1.7ML ~~LOC~~ SOLN
120.0000 mg | Freq: Once | SUBCUTANEOUS | Status: AC
  Administered 2015-07-02: 120 mg via SUBCUTANEOUS
  Filled 2015-07-02: qty 1.7

## 2015-07-02 NOTE — Patient Instructions (Signed)
patient was instructed to continue with GILOTRIF. He will finish this drug regimen first before starting [place].

## 2015-07-02 NOTE — Progress Notes (Signed)
[place] OFFICE PROGRESS NOTE  Patient Care Team: Leia Alf, MD as PCP - General (Internal Medicine)   SUMMARY OF ONCOLOGIC HISTORY:  # AUG 2015-  METASTATIC ADENO CA LUNG POSITIVE for EGFR Exon 21 L858R [neg- ALK;[name];[name],MET]; [name]; NOV [id number]- CT- Progression [RUL & Pelvic sclerotic lesions];  [name];[name]- Plan Tagrisso  # AUG 2015- Thoracic spine mets s/p decompression & Cerebellar brain met s/p RT; NOV 2016- MRI BRAIN-PROGRESS-NEW multiple brain lesions [~2m]  # Bony mets- on X-geva q 6 w   INTERVAL HISTORY:  49year old male patient with above history of 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  Patient finished radiation to days ago. Denies any unusual shortness of breath or chest pain. No worsening cough.  He is currently on afatinib. He has approximately 20 more pills. No abdominal pain nausea vomiting.  Continues to have diarrhea up to 1-2 loose stools a day.; This is improved on Imodium. Complains of chronic fatigue. No abdominal pain. No worsening pain. No seizures.   REVIEW OF SYSTEMS:  A complete 10 point review of system is done which is negative except mentioned above/history of present illness.   PAST MEDICAL HISTORY :  Past Medical History  Diagnosis Date  . HTN (hypertension)   . [place]    PAST SURGICAL HISTORY :   Past Surgical History  Procedure Laterality Date  . Laminectomy N/A 03/07/2014    Procedure: T1-T2 Laminectomy with Decompression of Cord and Removal of Cancer  thoracic one/two;  Surgeon: HKristeen Miss MD;  [facility];  Service: Neurosurgery;  Laterality: N/A;    FAMILY HISTORY :   Family History  Problem Relation Age of Onset  . Cancer Mother   . Heart disease Father   . Hypertension Sister   . Hypertension  Brother     SOCIAL HISTORY:   Social History  Substance Use Topics  . Smoking status: Never Smoker   . Smokeless tobacco: Never Used  . Alcohol Use: No     Comment: none for a year    ALLERGIES:  has No Known Allergies.  MEDICATIONS:  Current Outpatient Prescriptions  Medication Sig Dispense Refill  . hydrochlorothiazide (HYDRODIURIL) 25 MG tablet Take 25 mg by mouth daily.    . metoprolol succinate (TOPROL-XL) 50 MG 24 hr tablet Take 50 mg by mouth at bedtime.    .Marland Kitchenosimertinib mesylate (TAGRISSO) 80 MG tablet Take 1 tablet (80 mg total) by mouth daily. With or without food. 30 tablet 3  . potassium chloride (K-DUR,KLOR-CON) 10 MEQ tablet Take 1 tablet (10 mEq total) by mouth 2 (two) times daily. 60 tablet 3  . prochlorperazine (COMPAZINE) 10 MG tablet Take 1 tablet (10 mg total) by mouth every 6 (six) hours as needed for nausea or vomiting. 90 tablet 1   No current facility-administered medications for this visit.    PHYSICAL EXAMINATION: ECOG PERFORMANCE STATUS: 1 - Symptomatic but completely ambulatory  There were no vitals taken for this visit.   GENERAL: Well-nourished well-developed; Alert, no distress and comfortable. Accompanied by his  Son.  EYES: no pallor or icterus OROPHARYNX: no thrush or ulceration; good dentition  NECK: supple, no masses felt LYMPH: no palpable lymphadenopathy in the cervical, axillary or inguinal regions LUNGS: clear to auscultation and No wheeze or crackles HEART/CVS: regular rate & rhythm  and no murmurs; No lower extremity edema ABDOMEN:abdomen soft, non-tender and normal bowel sounds Musculoskeletal:no cyanosis of digits and no clubbing  PSYCH: alert & oriented x 3 with fluent speech NEURO: no focal motor/sensory deficits SKIN: no rashes or significant lesions  There were no vitals filed for this visit.  LABORATORY DATA:  I have reviewed the data as listed    Component Value Date/Time   NA 131* 06/25/2015 1339   NA 141  11/08/2014 1505   K 3.2* 06/25/2015 1339   K 3.7 11/08/2014 1505   CL 97* 06/25/2015 1339   CL 106 11/08/2014 1505   CO2 26 06/25/2015 1339   CO2 29 11/08/2014 1505   GLUCOSE 113* 06/25/2015 1339   GLUCOSE 75 11/08/2014 1505   BUN 15 06/25/2015 1339   BUN 13 11/08/2014 1505   CREATININE 0.98 06/25/2015 1339   CREATININE 1.15 11/08/2014 1505   CALCIUM 8.8* 06/25/2015 1339   CALCIUM 9.1 11/08/2014 1505   PROT 8.2* 06/25/2015 1339   PROT 7.7 11/08/2014 1505   ALBUMIN 3.6 06/25/2015 1339   ALBUMIN 4.0 11/08/2014 1505   AST 34 06/25/2015 1339   AST 31 11/08/2014 1505   ALT 40 06/25/2015 1339   ALT 53 11/08/2014 1505   ALKPHOS 171* 06/25/2015 1339   ALKPHOS 62 11/08/2014 1505   BILITOT 0.4 06/25/2015 1339   BILITOT 0.6 11/08/2014 1505   GFRNONAA >60 06/25/2015 1339   GFRNONAA >60 11/08/2014 1505   GFRNONAA >60 08/01/2014 1023   GFRAA >60 06/25/2015 1339   GFRAA >60 11/08/2014 1505   GFRAA >60 08/01/2014 1023    No results found for: SPEP, UPEP  Lab Results  Component Value Date   WBC 6.8 07/02/2015   NEUTROABS 4.2 07/02/2015   HGB 12.9* 07/02/2015   HCT 39.8* 07/02/2015   MCV 73.8* 07/02/2015   PLT 339 07/02/2015      Chemistry      Component Value Date/Time   NA 131* 06/25/2015 1339   NA 141 11/08/2014 1505   K 3.2* 06/25/2015 1339   K 3.7 11/08/2014 1505   CL 97* 06/25/2015 1339   CL 106 11/08/2014 1505   CO2 26 06/25/2015 1339   CO2 29 11/08/2014 1505   BUN 15 06/25/2015 1339   BUN 13 11/08/2014 1505   CREATININE 0.98 06/25/2015 1339   CREATININE 1.15 11/08/2014 1505      Component Value Date/Time   CALCIUM 8.8* 06/25/2015 1339   CALCIUM 9.1 11/08/2014 1505   ALKPHOS 171* 06/25/2015 1339   ALKPHOS 62 11/08/2014 1505   AST 34 06/25/2015 1339   AST 31 11/08/2014 1505   ALT 40 06/25/2015 1339   ALT 53 11/08/2014 1505   BILITOT 0.4 06/25/2015 1339   BILITOT 0.6 11/08/2014 1505       RADIOGRAPHIC STUDIES: CT scan reviewed as discussed  below  ASSESSMENT & PLAN:   # Metastatic adenocarcinoma of the lung with EGFR mutation/currently on SECOND LINE therapy with AFATINIB [started Nov 2016]- given the mild progression. Continue/finish afatinib [20 pills left]. We'll then START TAGRISSO [plans to get the medication this week; to start after finishing afatinib]  # Left upper lobe mass- status post radiation. We will plan to get a CT scan again in approximately 3 months.  # Diarrhea grade 1; continue Imodium.  # Elevated LFTs- likely secondary to Iressa. Come to normal. Improved.Jeoffrey Massed.  # Bone lesions- changing denosumab to every 6 weeks. He'll get denosumab today.  # Patient will have labs done in  3 weeks/follow-up.  The above plan of care was discussed with the patient in detail. All questions were answered.    Cammie Sickle, MD 07/02/2015 10:29 AM

## 2015-07-05 ENCOUNTER — Telehealth: Payer: Self-pay | Admitting: *Deleted

## 2015-07-05 NOTE — Telephone Encounter (Signed)
[employer] and [employer] need verification of pt's EGFR status for final prior auth approval for [employer]. His new drug will not be approved until this information has been obtained by the prior auth team. A copy of the EGFR status faxed to [facility] at [phone]  Case # [id number]. Fax confirmation received.

## 2015-07-11 ENCOUNTER — Telehealth: Payer: Self-pay | Admitting: *Deleted

## 2015-07-11 NOTE — Telephone Encounter (Signed)
Spoke with patient. His [employer] approved the drug and he reports that he received the shipment of Tagrisso in the mail on Tuesday evening.  He was reminded of md instructions to continue the Afatinib and finish this RX first. He states that he has approximately 15 days supply left of the Afatnib and he should be ready to start the new drug when he comes back to see Dr. Tish Men. I asked the patient to contact me at the cancer center when he finished the Afatinib before starting [place]. He gave verbal understanding. Teach back process performed with patient.

## 2015-07-12 ENCOUNTER — Other Ambulatory Visit: Payer: Self-pay | Admitting: Nurse Practitioner

## 2015-07-17 ENCOUNTER — Inpatient Hospital Stay: Payer: BLUE CROSS/BLUE SHIELD | Attending: Internal Medicine

## 2015-07-17 ENCOUNTER — Encounter: Payer: Self-pay | Admitting: *Deleted

## 2015-07-17 DIAGNOSIS — Z23 Encounter for immunization: Secondary | ICD-10-CM | POA: Insufficient documentation

## 2015-07-17 DIAGNOSIS — C3492 Malignant neoplasm of unspecified part of left bronchus or lung: Secondary | ICD-10-CM

## 2015-07-17 DIAGNOSIS — C7951 Secondary malignant neoplasm of bone: Secondary | ICD-10-CM | POA: Diagnosis not present

## 2015-07-17 DIAGNOSIS — R5382 Chronic fatigue, unspecified: Secondary | ICD-10-CM | POA: Insufficient documentation

## 2015-07-17 DIAGNOSIS — R7989 Other specified abnormal findings of blood chemistry: Secondary | ICD-10-CM | POA: Insufficient documentation

## 2015-07-17 DIAGNOSIS — C7931 Secondary malignant neoplasm of brain: Secondary | ICD-10-CM | POA: Diagnosis not present

## 2015-07-17 DIAGNOSIS — C3411 Malignant neoplasm of upper lobe, right bronchus or lung: Secondary | ICD-10-CM | POA: Insufficient documentation

## 2015-07-17 DIAGNOSIS — I10 Essential (primary) hypertension: Secondary | ICD-10-CM | POA: Insufficient documentation

## 2015-07-17 DIAGNOSIS — Z79899 Other long term (current) drug therapy: Secondary | ICD-10-CM | POA: Diagnosis not present

## 2015-07-17 DIAGNOSIS — C7802 Secondary malignant neoplasm of left lung: Secondary | ICD-10-CM | POA: Insufficient documentation

## 2015-07-17 DIAGNOSIS — Z923 Personal history of irradiation: Secondary | ICD-10-CM | POA: Diagnosis not present

## 2015-07-17 DIAGNOSIS — R197 Diarrhea, unspecified: Secondary | ICD-10-CM | POA: Insufficient documentation

## 2015-07-17 LAB — CBC WITH DIFFERENTIAL/PLATELET
Basophils Absolute: 0 10*3/uL (ref 0–0.1)
Basophils Relative: 1 %
EOS PCT: 5 %
Eosinophils Absolute: 0.4 10*3/uL (ref 0–0.7)
HEMATOCRIT: 39.2 % — AB (ref 40.0–52.0)
Hemoglobin: 12.6 g/dL — ABNORMAL LOW (ref 13.0–18.0)
LYMPHS ABS: 1.7 10*3/uL (ref 1.0–3.6)
LYMPHS PCT: 25 %
MCH: 23.7 pg — AB (ref 26.0–34.0)
MCHC: 32.1 g/dL (ref 32.0–36.0)
MCV: 73.7 fL — AB (ref 80.0–100.0)
MONO ABS: 0.7 10*3/uL (ref 0.2–1.0)
Monocytes Relative: 10 %
Neutro Abs: 4.1 10*3/uL (ref 1.4–6.5)
Neutrophils Relative %: 59 %
PLATELETS: 275 10*3/uL (ref 150–440)
RBC: 5.32 MIL/uL (ref 4.40–5.90)
RDW: 17.7 % — ABNORMAL HIGH (ref 11.5–14.5)
WBC: 6.8 10*3/uL (ref 3.8–10.6)

## 2015-07-17 LAB — COMPREHENSIVE METABOLIC PANEL
ALK PHOS: 109 U/L (ref 38–126)
ALT: 45 U/L (ref 17–63)
AST: 25 U/L (ref 15–41)
Albumin: 3.5 g/dL (ref 3.5–5.0)
Anion gap: 6 (ref 5–15)
BUN: 12 mg/dL (ref 6–20)
CALCIUM: 8.6 mg/dL — AB (ref 8.9–10.3)
CO2: 26 mmol/L (ref 22–32)
CREATININE: 0.99 mg/dL (ref 0.61–1.24)
Chloride: 102 mmol/L (ref 101–111)
Glucose, Bld: 94 mg/dL (ref 65–99)
Potassium: 3.6 mmol/L (ref 3.5–5.1)
Sodium: 134 mmol/L — ABNORMAL LOW (ref 135–145)
TOTAL PROTEIN: 7.9 g/dL (ref 6.5–8.1)
Total Bilirubin: 0.5 mg/dL (ref 0.3–1.2)

## 2015-07-17 NOTE — Progress Notes (Signed)
Patient came to clinic today for lab check. Labs personally reviewed with patient and his wife. Labs are remarkable today. Liver enzymes are normal today. Hgb, plt count and wbc count stable. Pt has no complaints. He has not had any episodes of diarrhea.  He inquired to review the instructions and side effects of the Tagrisso. He should finish the Afatinib on Saturday and should start the [place] on Sunday. Luis [name] was provided written and verbal instructions on the use of Tagrisso. Rn instructed patient to place his dose TAGRISSO in a glass of waters (approximately 2 ounces/60 mL) of water. Explained to patient not to use carbonated water or any other liquids. He should stir the TAGRISSO tablet and water until the [facility] tablet is in small pieces. RN explained that the tablet will not completely dissolve. He should not crush or heat the prepared mixture of TAGRISSO/water. He should drink it right away. I explained that he may need to add more water into the container or glass of water to make sure he gets the entire dosage of Tagrisso.  He can take the medication with or without food.  Teach back process performed with patient.

## 2015-07-18 ENCOUNTER — Telehealth: Payer: Self-pay | Admitting: *Deleted

## 2015-07-18 NOTE — Telephone Encounter (Signed)
Luis [name] from [employer] called. Per [employer], The patient's Luis [name] has not been approved yet by [employer] and [employer]. The medication has gone for Provider Courtesy Review (PCR). I explained to Luis [name] that [employer] and [employer] contacted me on 07/05/15 to request copy of EGFR status. This was directly faxed to [facility] at [phone]  Case # [id number]. Fax confirmation was received.  According to Luis [name], the Tagrisso was not yet shipped. I explained to Luis [name] that the patient told us that he already received a shipment of the [employer] 2 weeks ago and the patient telephone our office on 07/11/15 to let us know that he was approved. The patient also stopped by our cancer center yesterday and asked RN to review medication instructions as the [employer] nurse had not yet called him to review the new drug information, which the patient reported "as strange as diplomat always calls him personally.  Luis [name] states that [employer] did not ship any drug to the patient according to the documentation at [employer].  I immediately called the patient and asked him to come to the cancer center to show me which oral chemotherapy drug that he had at home that he "thought was shipped to him" He stated that he could not come to the cancer center today but could come tomorrow. I asked the patient to read to me the new oral chemotherapy drug name on the bottle. He stated that he has yet to "open up this box and that it had been laying there in his den for 2 weeks." The patient opened the box and the patient stated that the medication bottle included another prescription of Gilotrif, which is what the patient is currently taking. The patient stated "I really thought that this was my new medication box of the Tagrisso. Evidently, this box was shipped to me weeks ago and I thought it was my new medication." I asked the patient continue taking this afatinib (Gilotrif) until we can get the Tagrisso ordered and approved. Teach back process  performed with the patient. Luis [name] from [employer] stated that she will continue to call [employer] every day until the drug approval is granted. An estimated turn around time with [employer] is on 07/25/15 at the earliest.

## 2015-07-19 ENCOUNTER — Encounter: Payer: Self-pay | Admitting: *Deleted

## 2015-07-19 ENCOUNTER — Encounter: Payer: Self-pay | Admitting: Internal Medicine

## 2015-07-19 ENCOUNTER — Telehealth: Payer: Self-pay | Admitting: *Deleted

## 2015-07-19 NOTE — Telephone Encounter (Signed)
Letter with md last office note and EGFR status faxed to [facility]

## 2015-07-19 NOTE — Telephone Encounter (Signed)
RN contacted [employer]. MD received notification that the Luis [name] has been denied since clinic "did not send proof EGFR status."  I called the prior auth team for claim denials. I had to leave a voice mail message. Explained that all the pathology reports that indicated EGFR status was faxed on 07/05/15 to [facility] and [employer]. Fax confirmation was received at that time. I explained the urgent nature of appealing this claim. The patient has EGFR mutation. I also explained that Luis [name] at [facility] also reached out to [facility] yesterday. Pending call back from [facility].  Luis [name] Cross ID # [id number]   [employer] Case # Felicie Morn

## 2015-07-19 NOTE — Telephone Encounter (Signed)
[employer] Returned phone call to [employer]. [employer] States that appeal form-level 1 must be completed before [employer] will consider appeal. Stated, "you can go to [employer] website to follow the appeal process." RN completed form. Had NP sign the form on behalf of Dr. Rogue Bussing.

## 2015-07-20 ENCOUNTER — Encounter: Payer: Self-pay | Admitting: *Deleted

## 2015-07-24 ENCOUNTER — Encounter: Payer: Self-pay | Admitting: *Deleted

## 2015-07-26 ENCOUNTER — Telehealth: Payer: Self-pay | Admitting: *Deleted

## 2015-07-26 ENCOUNTER — Inpatient Hospital Stay: Payer: BLUE CROSS/BLUE SHIELD

## 2015-07-26 ENCOUNTER — Inpatient Hospital Stay (HOSPITAL_BASED_OUTPATIENT_CLINIC_OR_DEPARTMENT_OTHER): Payer: BLUE CROSS/BLUE SHIELD | Admitting: Internal Medicine

## 2015-07-26 ENCOUNTER — Encounter: Payer: Self-pay | Admitting: Internal Medicine

## 2015-07-26 VITALS — BP 136/91 | HR 110 | Temp 98.4°F | Resp 16 | Ht 67.0 in | Wt 139.1 lb

## 2015-07-26 DIAGNOSIS — C7951 Secondary malignant neoplasm of bone: Secondary | ICD-10-CM

## 2015-07-26 DIAGNOSIS — C7931 Secondary malignant neoplasm of brain: Secondary | ICD-10-CM

## 2015-07-26 DIAGNOSIS — C3411 Malignant neoplasm of upper lobe, right bronchus or lung: Secondary | ICD-10-CM

## 2015-07-26 DIAGNOSIS — C3492 Malignant neoplasm of unspecified part of left bronchus or lung: Secondary | ICD-10-CM

## 2015-07-26 DIAGNOSIS — C3491 Malignant neoplasm of unspecified part of right bronchus or lung: Secondary | ICD-10-CM

## 2015-07-26 DIAGNOSIS — Z79899 Other long term (current) drug therapy: Secondary | ICD-10-CM

## 2015-07-26 DIAGNOSIS — R7989 Other specified abnormal findings of blood chemistry: Secondary | ICD-10-CM

## 2015-07-26 DIAGNOSIS — C7802 Secondary malignant neoplasm of left lung: Secondary | ICD-10-CM

## 2015-07-26 DIAGNOSIS — Z23 Encounter for immunization: Secondary | ICD-10-CM

## 2015-07-26 DIAGNOSIS — R5382 Chronic fatigue, unspecified: Secondary | ICD-10-CM

## 2015-07-26 DIAGNOSIS — R197 Diarrhea, unspecified: Secondary | ICD-10-CM

## 2015-07-26 DIAGNOSIS — I10 Essential (primary) hypertension: Secondary | ICD-10-CM

## 2015-07-26 DIAGNOSIS — Z923 Personal history of irradiation: Secondary | ICD-10-CM

## 2015-07-26 LAB — CBC WITH DIFFERENTIAL/PLATELET
BASOS ABS: 0.1 10*3/uL (ref 0–0.1)
BASOS PCT: 1 %
EOS ABS: 0.2 10*3/uL (ref 0–0.7)
Eosinophils Relative: 3 %
HCT: 38.2 % — ABNORMAL LOW (ref 40.0–52.0)
Hemoglobin: 12.4 g/dL — ABNORMAL LOW (ref 13.0–18.0)
Lymphocytes Relative: 23 %
Lymphs Abs: 2 10*3/uL (ref 1.0–3.6)
MCH: 23.9 pg — ABNORMAL LOW (ref 26.0–34.0)
MCHC: 32.4 g/dL (ref 32.0–36.0)
MCV: 73.8 fL — ABNORMAL LOW (ref 80.0–100.0)
MONO ABS: 0.7 10*3/uL (ref 0.2–1.0)
MONOS PCT: 8 %
NEUTROS PCT: 67 %
Neutro Abs: 5.9 10*3/uL (ref 1.4–6.5)
Platelets: 256 10*3/uL (ref 150–440)
RBC: 5.17 MIL/uL (ref 4.40–5.90)
RDW: 18.2 % — AB (ref 11.5–14.5)
WBC: 8.9 10*3/uL (ref 3.8–10.6)

## 2015-07-26 LAB — COMPREHENSIVE METABOLIC PANEL
ALBUMIN: 3.5 g/dL (ref 3.5–5.0)
ALT: 72 U/L — ABNORMAL HIGH (ref 17–63)
ANION GAP: 4 — AB (ref 5–15)
AST: 56 U/L — AB (ref 15–41)
Alkaline Phosphatase: 135 U/L — ABNORMAL HIGH (ref 38–126)
BUN: 12 mg/dL (ref 6–20)
CHLORIDE: 104 mmol/L (ref 101–111)
CO2: 27 mmol/L (ref 22–32)
Calcium: 8.7 mg/dL — ABNORMAL LOW (ref 8.9–10.3)
Creatinine, Ser: 0.92 mg/dL (ref 0.61–1.24)
GFR calc Af Amer: >60 mL/min (ref >60)
GFR calc non Af Amer: >60 mL/min (ref >60)
GLUCOSE: 143 mg/dL — AB (ref 65–99)
POTASSIUM: 3.3 mmol/L — AB (ref 3.5–5.1)
SODIUM: 135 mmol/L (ref 135–145)
TOTAL PROTEIN: 7.9 g/dL (ref 6.5–8.1)
Total Bilirubin: 0.8 mg/dL (ref 0.3–1.2)

## 2015-07-26 MED ORDER — INFLUENZA VAC SPLIT QUAD 0.5 ML IM SUSY
0.5000 mL | PREFILLED_SYRINGE | Freq: Once | INTRAMUSCULAR | Status: AC
  Administered 2015-07-26: 0.5 mL via INTRAMUSCULAR
  Filled 2015-07-26: qty 0.5

## 2015-07-26 MED ORDER — DIPHENOXYLATE-ATROPINE 2.5-0.025 MG PO TABS: 1.0000 | ORAL_TABLET | Freq: Four times a day (QID) | ORAL | Status: DC | PRN

## 2015-07-26 NOTE — Patient Instructions (Signed)
Continue taking your afatinib daily.

## 2015-07-26 NOTE — Progress Notes (Signed)
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  Patient states that he stopped taking afatinib 5 days ago; as he was expecting a new drug [Tagrisso] to come in.   Denies any unusual shortness of breath or chest pain. No worsening cough. No abdominal pain nausea vomiting.  Continues to have diarrhea up to 2-3loose stools a day. Not well controlled on Imodium; Complains of chronic fatigue. No abdominal pain. No worsening pain. No seizures.  [place] will [place] in the first week of February.   REVIEW OF SYSTEMS:  A complete 10 point review of system is done which is negative except mentioned above/history of present illness.   PAST MEDICAL HISTORY :  Past Medical History  Diagnosis Date  . HTN (hypertension)   . Adenocarcinoma of lung ([place] 12/05/2014  . Brain lesion   . 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 History of radiation therapy     PAST SURGICAL HISTORY :   Past Surgical History  Procedure Laterality Date  . Laminectomy N/A 03/07/2014    Procedure: T1-T2 Laminectomy with Decompression of Cord and Removal of Cancer  thoracic one/two;  Surgeon: HKristeen Miss  MD;  [facility];  Service: Neurosurgery;  Laterality: N/A;  . Ct guided biopsy  ([facility] hx)  05/28/2014    FAMILY HISTORY :   Family History  Problem Relation Age of Onset  . Cancer Mother   . Heart disease Father   . Hypertension Sister   . Hypertension Brother     SOCIAL HISTORY:   Social History  Substance Use Topics  . Smoking status: Never Smoker   . Smokeless tobacco: Never Used  . Alcohol Use: No     Comment: none for a year    ALLERGIES:  has No Known Allergies.  MEDICATIONS:  Current Outpatient Prescriptions  Medication Sig Dispense Refill  . hydrochlorothiazide (HYDRODIURIL) 25 MG tablet Take 25 mg by mouth daily.    . Loperamide HCl (IMODIUM PO) Take 1 tablet by mouth as needed (diarrhea and subsequently after each loose stool).    . metoprolol succinate (TOPROL-XL) 50 MG 24 hr tablet Take 50 mg by mouth daily.     . potassium chloride (K-DUR,KLOR-CON) 10 MEQ tablet Take 1 tablet (10 mEq total) by mouth 2 (two) times daily. 60 tablet 3  . prochlorperazine (COMPAZINE) 10 MG tablet Take 1 tablet (10 mg total) by mouth every 6 (six) hours as needed for nausea or vomiting. 90 tablet 1  . osimertinib mesylate (TAGRISSO) 80 MG tablet Take 1 tablet (80 mg total) by mouth daily. With or without food. (Patient not taking: Reported on 07/02/2015) 30 tablet 3   No current facility-administered  medications for this visit.    PHYSICAL EXAMINATION: ECOG PERFORMANCE STATUS: 1 - Symptomatic but completely ambulatory  BP 136/91 mmHg  Pulse 110  Temp(Src) 98.4 F (36.9 C) (Tympanic)  Resp 16  Ht _0  (1.702 m)  Wt 139 lb 1.8 oz (63.1 kg)  BMI 21.78 kg/m2   GENERAL: Well-nourished well-developed; Alert, no distress and comfortable.He is alone. EYES: no pallor or icterus OROPHARYNX: no thrush or ulceration; good dentition  NECK: supple, no masses felt LYMPH: no palpable lymphadenopathy in the cervical, axillary or inguinal regions LUNGS: clear to  auscultation and No wheeze or crackles HEART/CVS: regular rate & rhythm and no murmurs; No lower extremity edema ABDOMEN:abdomen soft, non-tender and normal bowel sounds Musculoskeletal:no cyanosis of digits and no clubbing  PSYCH: alert & oriented x 3; slow speech. NEURO: no focal motor/sensory deficits;  SKIN: no rashes or significant lesions  Filed Weights   07/26/15 0858  Weight: 139 lb 1.8 oz (63.1 kg)    LABORATORY DATA:  I have reviewed the data as listed    Component Value Date/Time   NA 135 07/26/2015 0841   NA 141 11/08/2014 1505   K 3.3* 07/26/2015 0841   K 3.7 11/08/2014 1505   CL 104 07/26/2015 0841   CL 106 11/08/2014 1505   CO2 27 07/26/2015 0841   CO2 29 11/08/2014 1505   GLUCOSE 143* 07/26/2015 0841   GLUCOSE 75 11/08/2014 1505   BUN 12 07/26/2015 0841   BUN 13 11/08/2014 1505   CREATININE 0.92 07/26/2015 0841   CREATININE 1.15 11/08/2014 1505   CALCIUM 8.7* 07/26/2015 0841   CALCIUM 9.1 11/08/2014 1505   PROT 7.9 07/26/2015 0841   PROT 7.7 11/08/2014 1505   ALBUMIN 3.5 07/26/2015 0841   ALBUMIN 4.0 11/08/2014 1505   AST 56* 07/26/2015 0841   AST 31 11/08/2014 1505   ALT 72* 07/26/2015 0841   ALT 53 11/08/2014 1505   ALKPHOS 135* 07/26/2015 0841   ALKPHOS 62 11/08/2014 1505   BILITOT 0.8 07/26/2015 0841   BILITOT 0.6 11/08/2014 1505   GFRNONAA >60 07/26/2015 0841   GFRNONAA >60 11/08/2014 1505   GFRNONAA >60 08/01/2014 1023   GFRAA >60 07/26/2015 0841   GFRAA >60 11/08/2014 1505   GFRAA >60 08/01/2014 1023    No results found for: SPEP, UPEP  Lab Results  Component Value Date   WBC 8.9 07/26/2015   NEUTROABS 5.9 07/26/2015   HGB 12.4* 07/26/2015   HCT 38.2* 07/26/2015   MCV 73.8* 07/26/2015   PLT 256 07/26/2015      Chemistry      Component Value Date/Time   NA 135 07/26/2015 0841   NA 141 11/08/2014 1505   K 3.3* 07/26/2015 0841   K 3.7 11/08/2014 1505   CL 104 07/26/2015 0841   CL 106 11/08/2014 1505   CO2 27 07/26/2015  0841   CO2 29 11/08/2014 1505   BUN 12 07/26/2015 0841   BUN 13 11/08/2014 1505   CREATININE 0.92 07/26/2015 0841   CREATININE 1.15 11/08/2014 1505      Component Value Date/Time   CALCIUM 8.7* 07/26/2015 0841   CALCIUM 9.1 11/08/2014 1505   ALKPHOS 135* 07/26/2015 0841   ALKPHOS 62 11/08/2014 1505   AST 56* 07/26/2015 0841   AST 31 11/08/2014 1505   ALT 72* 07/26/2015 0841   ALT 53 11/08/2014 1505   BILITOT 0.8 07/26/2015 0841   BILITOT 0.6 11/08/2014 1505       RADIOGRAPHIC STUDIES: CT scan  reviewed as discussed below  ASSESSMENT & PLAN:   # Metastatic adenocarcinoma of the lung with EGFR mutation/currently on SECOND LINE therapy with AFATINIB [started Nov 2016]- given the mild progression. It is recommended patient continue afatinib at the current dose until the arrival of his new drug [Tagrisso].   # Left upper lobe mass- status post radiation. We will plan to get a CT scan again in approximately one month  # Diarrhea grade 1-2; continue Imodium; add Lomotil.  # Elevated LFTs- likely secondary to Iressa; improved.  # Bone lesions- last got on 12/19th; will plan to get a again at next visit  # Patient will have labs done in 3 weeks/follow-up.  #I discussed the potential risk of traveling With his cancer under suboptimal control at this time; however he has made his plans.    The above plan of care was discussed with the patient in detail. All questions were answered. Patient will follow-up with me in approximately 4 weeks/scans/labs.     Cammie Sickle, MD 07/26/2015 9:17 AM

## 2015-07-26 NOTE — Telephone Encounter (Signed)
Call transferred to rn from [facility], who received call from [employer] cross. Patient contacted [employer] and [employer] shield patient advocate to personally appeal his Tagrisso.  [employer] representative calling cancer center on behalf of patient.  I explained to the representative that the cancer center has faxed multiple documents demonstrating the patient's EGFR status and dx of lung cancer. The representative for [employer] states that the claim is showing "prior auth status in the computer."  She subsequently transferred RN to appeal team after the situation was discussed with bcbs patient advocate.  RN was transferred to the appeals team at [phone].  Per appeal team, the appeal process, level 1. A peer to peer physician/provider can not be performed and that our office must fax records and complete an appeal level 1 form. I explained that the forms and the records were already faxed on 07/19/15 with fax confirmation to bcbs. The appeal team states that RN needs to speak to the Level 1 appeal team to clarify if the appeal request has been received. This team could be reached at [phone] x [phone]  Approximately 35 minutes spent on hold for the appeal team level 1, RN finally received voice mail to this team. I marked my call urgent! Waiting on call back from blue cross and blue shield  Ref # [id number] BLUE CROSS ID: [id number]

## 2015-07-26 NOTE — Progress Notes (Signed)
Pt has appt on 08-06-15 for lab and md. Order sent to scheduling to cnl this apt as it is not needed per MD.  RN asked scheduling to ensure patient received msg not to come on 08/06/15 as patient is having memory changes.

## 2015-07-26 NOTE — Progress Notes (Signed)
Patient states he is not having diarrhea. States since he stopped taking Afatinib his bowel movements are normal and he goes every day. Denies pain, nausea or vomiting.

## 2015-07-30 ENCOUNTER — Telehealth: Payer: Self-pay | Admitting: *Deleted

## 2015-07-30 ENCOUNTER — Ambulatory Visit
Admission: RE | Admit: 2015-07-30 | Payer: BLUE CROSS/BLUE SHIELD | Source: Ambulatory Visit | Admitting: Radiation Oncology

## 2015-07-30 NOTE — Telephone Encounter (Signed)
RN attempted to contact appeal line at 1210 on 07/30/15. Phone msg states that "bcbs is closed for the [employer]."  RN contact [place]. No answer. Waited 20 minutes for receptionist to answer phone. Will contact [employer] tomorrow. Need update whether appeal process has been received and in review by bcbs.

## 2015-07-31 ENCOUNTER — Telehealth: Payer: Self-pay | Admitting: *Deleted

## 2015-07-31 NOTE — Telephone Encounter (Signed)
Luis [name] at [facility] and [facility] Urgent appeal process. BCBS acknowledge fax. BCBS asking for provider peer to peer to be set up so that the appeal process can be completed. BCBS provided Dr. Sharmaine Base cell phone number, my cancer center ascom number and my desk number. Contacted Dr. B to inform provider that he should expect a call from [facility] within the next 24-48 hours.

## 2015-08-01 ENCOUNTER — Telehealth: Payer: Self-pay | Admitting: *Deleted

## 2015-08-01 NOTE — Telephone Encounter (Signed)
rcvd fax that appeal process has been received by BCBS. A decision will be made on the medication appeal within 72 hours according to the fax.  Md made aware.

## 2015-08-01 NOTE — Telephone Encounter (Signed)
Charmian Muff called from [place] regarding [place] appeal. bcbs has processed this to be an expedited appeal. Verifiying if cancer center received fax from this morning. Her call back number is  Phone: [phone]  Cherre Blanc back at 1637 today. Left vm with bcbs that fax was received.

## 2015-08-02 ENCOUNTER — Telehealth: Payer: Self-pay | Admitting: *Deleted

## 2015-08-02 NOTE — Telephone Encounter (Signed)
Left msg for patient that bcbs approved the drug appeal for tagrisso. I will keep him updated on the process.

## 2015-08-02 NOTE — Telephone Encounter (Signed)
Received fax. BCBS notified cancer center that [place] appeal has been approved.  RN contacted [place] via telephone and also faxed the bcbs auth confirmation to diplomat. Per MD patient may be in [place] at this time as he was scheduled to go there for a few weeks for vacation. If, so this may also affect the patient getting started on Tagrisso. Will wait for confirmation from diplomat. At this time, I do not know if pt will need copay assistance for this drug.  I also received a noticed via fax from [place] on Mr. Skowron. He has evidently appointed a lady by the name of Darlyn Read to represent him regarding the patient appeal process for Tagrisso. Unfortunately, BCBS was unable to complete the patient's attempt to appeal the claim as the pt did not sign the HIPPA forms for this representative.  In leu of the approval through direct nurse/physician advocacy, this form is no longer necessary since the drug has now been approved by [employer]. However, I will let the patient know that in the future, he needs to sign this form for future persons to represent him.

## 2015-08-06 ENCOUNTER — Other Ambulatory Visit: Payer: BLUE CROSS/BLUE SHIELD

## 2015-08-06 ENCOUNTER — Ambulatory Visit: Payer: BLUE CROSS/BLUE SHIELD | Admitting: Internal Medicine

## 2015-08-08 ENCOUNTER — Telehealth: Payer: Self-pay | Admitting: *Deleted

## 2015-08-08 NOTE — Telephone Encounter (Signed)
Rn [employer] to inquire update on drug shipment for the [place]. Diplomat contact pt on 08/03/15 and 08/07/15. No answer. Diplomat has been trying to reach the patient without success. I explained that thepatient was going out of the country for a few week. I have even tried to contact the patient last week. I left a vm and the patient has not returned my call.  Per [employer]omat, the patient may need co-pay assistance. Per Diplomat, even with insurance his co-pay for this new drug will be $790/30 days supply. I urged [employer] to contact me at the cancer center as soon as the company is in contact with the patient.  I explained to [employer] that the patient has been taking his Afatinib until the Tagrisso is shipped. If pt needs patient assistance and funding, this may delay the shipment for [place].  Dr. Rogue Bussing will need to be updated as soon as possible so that we can provide the best treatment recommendations for the patient. I will continue my attempts to reach out to the patient as well.  I did attempt to contact pt this afternoon. vm states Mr. Exley is "not available" left msg that I am trying to reach the patient.

## 2015-08-14 ENCOUNTER — Telehealth: Payer: Self-pay | Admitting: *Deleted

## 2015-08-14 IMAGING — CR DG THORACIC SPINE 1V
1 series · 1 of 1 positions shown · non-contrast
Comparison: Thoracic MRI 03/07/2014

CLINICAL DATA: Thoracic laminectomy for tumor removal

EXAM:
OPERATIVE THORACIC SPINE 1 VIEW(S)

[ap]
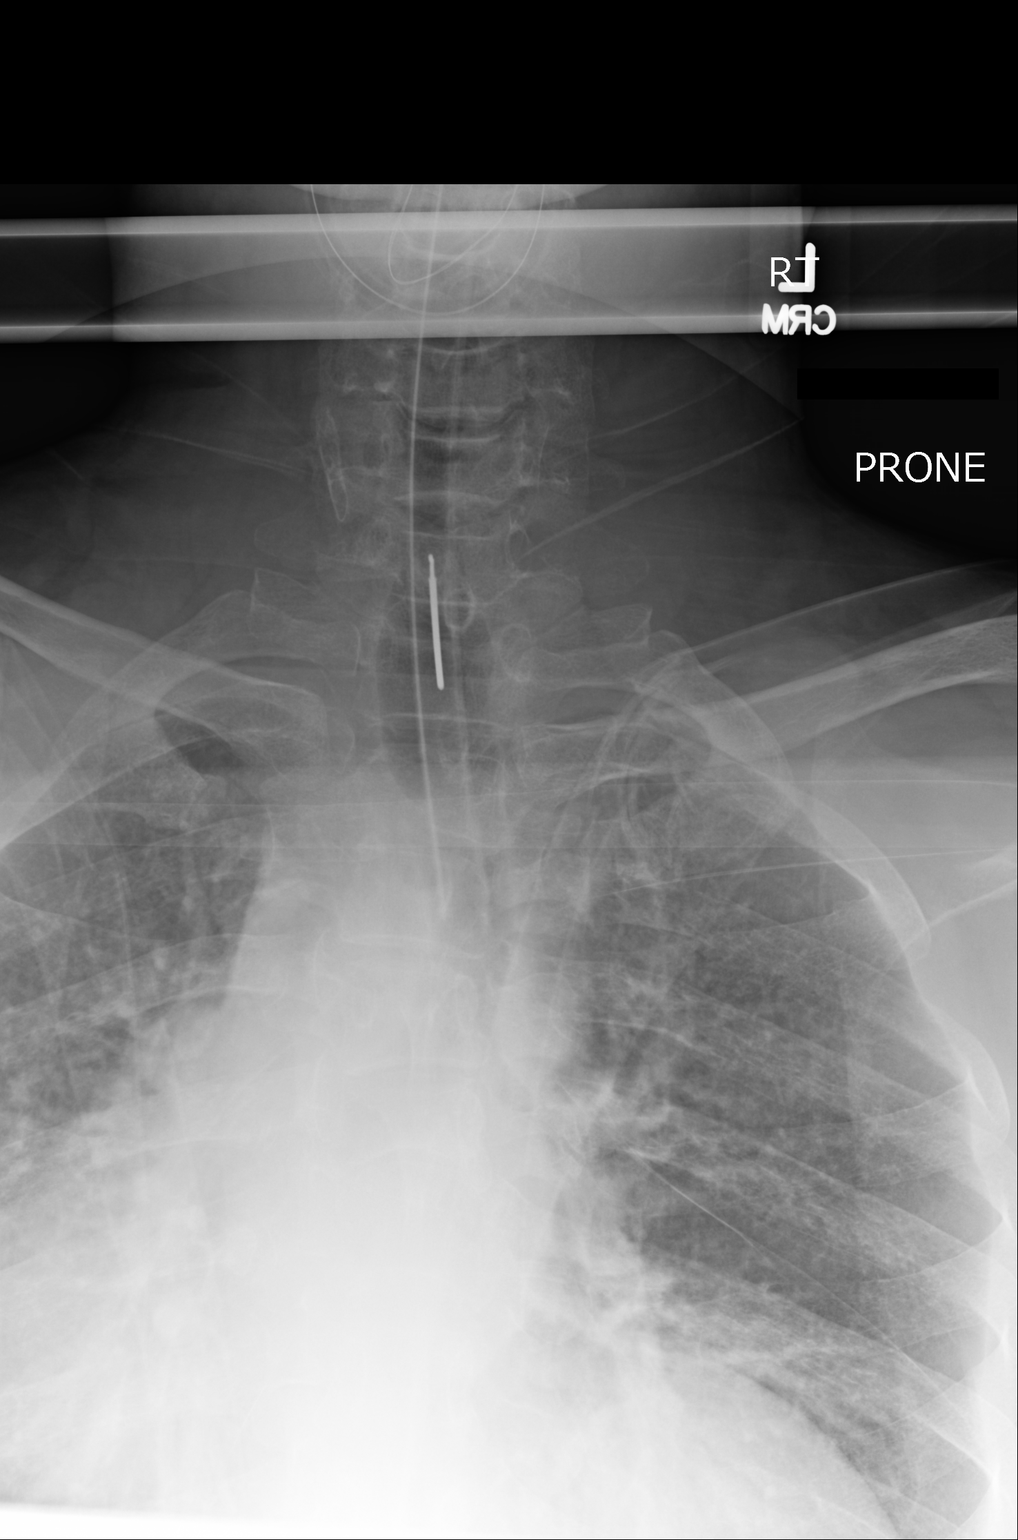

[1 of 1 positions shown; findings below may reference images not displayed]

FINDINGS: Tip of endotracheal tube projects 3.2 cm above carina.

Metallic marker projects over C7 and T1 at the midline.

Thoracic spine inadequately visualized due to technique.
IMPRESSION: Limited exam showing no definite acute thoracic spine abnormalities.

## 2015-08-14 NOTE — Telephone Encounter (Signed)
[place] to see if the pharmacy was successful in reaching the patient. [employer]omat has been unsuccessful in reaching the patient. Multiple attempts have been made to discuss copayment and drug delivery. I have left msgs as well and patient has not return my call. At last md visit, pt informed md that he wanted to go back to his home country and would be traveling abroad for a few weeks. I explained this to [place].

## 2015-08-20 ENCOUNTER — Ambulatory Visit
Admission: RE | Admit: 2015-08-20 | Discharge: 2015-08-20 | Disposition: A | Discharge status: Home / Self Care | Payer: BLUE CROSS/BLUE SHIELD | Source: Ambulatory Visit | Attending: Internal Medicine | Admitting: Internal Medicine

## 2015-08-20 ENCOUNTER — Telehealth: Payer: Self-pay | Admitting: *Deleted

## 2015-08-20 ENCOUNTER — Ambulatory Visit: Admission: RE | Admit: 2015-08-20 | Payer: BLUE CROSS/BLUE SHIELD | Source: Ambulatory Visit

## 2015-08-20 DIAGNOSIS — R911 Solitary pulmonary nodule: Secondary | ICD-10-CM | POA: Diagnosis not present

## 2015-08-20 DIAGNOSIS — C3491 Malignant neoplasm of unspecified part of right bronchus or lung: Secondary | ICD-10-CM | POA: Insufficient documentation

## 2015-08-20 DIAGNOSIS — M8568 Other cyst of bone, other site: Secondary | ICD-10-CM | POA: Diagnosis not present

## 2015-08-20 MED ORDER — IOHEXOL 300 MG/ML  SOLN
75.0000 mL | Freq: Once | INTRAMUSCULAR | Status: AC | PRN
  Administered 2015-08-20: 75 mL via INTRAVENOUS

## 2015-08-20 NOTE — Telephone Encounter (Signed)
RN contacted patient at 135pm to speak to him about his care. The patient states that he is walking in the building at the cancer center now and would like to speak to me face to face. Pt escorted to consult room for RN/PT discussion.

## 2015-08-20 NOTE — Telephone Encounter (Signed)
1355-Pt presents to clinic with face to face discussion with RN. He arrived back from vacation in [place] on Thursday last week. He states that he has an a "head cold, post-nasal drip and a dry cough" since coming back from vacation. He is using over the counter cough suppressant. He reports 1-2 loose stools per day x 7 days. He is treating his loose stools with lomotil. He is not using the immodium. He is still taking the Gilotrif daily.  I spoke with Dr. Rogue Bussing. The patient did not go to his MRI of brain today as scheduled. I am working with cancer center scheduling to get this re-scheduled. He did go for his CT scan of Chest. I reviewed these results with him in the clinic.  Dr. Rogue Bussing would like the patient to have his mri of the brain before scheduling an appointment with him.  Regarding the diarrhea, I explained that the MD would like the patient to stop the Afatinib for now. Pt was educated to use lomotil and imodium for loose stools. For his upper respiratory symptoms, MD would like him to use Claritin over the counter.  Teach back process performed with patient. Pt provided with these written instructions as well.  While the patient was here today, I was able to contact [place] via speaker phone. Copay assistance was discussed with the patient in length by [employer]omat.  The patient can be given a copay assistance card.  Per Diplomat, the patient's charge would be $25/month. The patient gave verbal permission to Diplomat to initiate this process.  UPS is shipping his [place] today. Estimated arrival time for UPS shipment is tomorrow afternoon. The patient gave verbal understanding that he will need to sign for the drug via UPS shipment.  The patient was instructed not to start on this new Tagrisso drug until patient MD determines when to start drug. MD will reevaluate the patient on Thursday after MRI brain scan.   Patient provided with teaching book for Tagrisso and RN reviewed  instructions on how to take the [place]. RN reinforced teaching.  Rn instructed patient to place his dose TAGRISSO in a glass of waters (approximately 2 ounces/60 mL) of water. Explained to patient not to use carbonated water or any other liquids. He should stir the TAGRISSO tablet and water until the [facility] tablet is in small pieces. RN explained that the tablet will not completely dissolve. He should not crush or heat the prepared mixture of TAGRISSO/water. He should drink it right away. I explained that he may need to add more water into the container or glass of water to make sure he gets the entire dosage of Tagrisso. He can take the medication with or without food. Teach back process performed with patient. Written medication administration instructions/ information provided.  Approximately 60+ minutes spent with the patient face to face in coordinating his care today.  Pt discharged at 1510.

## 2015-08-23 ENCOUNTER — Encounter: Payer: Self-pay | Admitting: Internal Medicine

## 2015-08-23 ENCOUNTER — Ambulatory Visit
Admission: RE | Admit: 2015-08-23 | Discharge: 2015-08-23 | Disposition: A | Discharge status: Home / Self Care | Payer: BLUE CROSS/BLUE SHIELD | Source: Ambulatory Visit | Attending: Internal Medicine | Admitting: Internal Medicine

## 2015-08-23 ENCOUNTER — Inpatient Hospital Stay: Payer: BLUE CROSS/BLUE SHIELD | Admitting: Internal Medicine

## 2015-08-23 ENCOUNTER — Inpatient Hospital Stay (HOSPITAL_BASED_OUTPATIENT_CLINIC_OR_DEPARTMENT_OTHER): Payer: BLUE CROSS/BLUE SHIELD | Admitting: Internal Medicine

## 2015-08-23 ENCOUNTER — Inpatient Hospital Stay: Payer: BLUE CROSS/BLUE SHIELD

## 2015-08-23 ENCOUNTER — Inpatient Hospital Stay: Payer: BLUE CROSS/BLUE SHIELD | Attending: Internal Medicine

## 2015-08-23 VITALS — BP 122/86 | HR 103 | Temp 97.2°F | Resp 18 | Ht 67.0 in | Wt 134.6 lb

## 2015-08-23 DIAGNOSIS — R7989 Other specified abnormal findings of blood chemistry: Secondary | ICD-10-CM | POA: Insufficient documentation

## 2015-08-23 DIAGNOSIS — Z923 Personal history of irradiation: Secondary | ICD-10-CM

## 2015-08-23 DIAGNOSIS — R490 Dysphonia: Secondary | ICD-10-CM | POA: Insufficient documentation

## 2015-08-23 DIAGNOSIS — C3491 Malignant neoplasm of unspecified part of right bronchus or lung: Secondary | ICD-10-CM

## 2015-08-23 DIAGNOSIS — C3411 Malignant neoplasm of upper lobe, right bronchus or lung: Secondary | ICD-10-CM | POA: Insufficient documentation

## 2015-08-23 DIAGNOSIS — C3490 Malignant neoplasm of unspecified part of unspecified bronchus or lung: Secondary | ICD-10-CM | POA: Diagnosis not present

## 2015-08-23 DIAGNOSIS — R05 Cough: Secondary | ICD-10-CM

## 2015-08-23 DIAGNOSIS — I10 Essential (primary) hypertension: Secondary | ICD-10-CM | POA: Insufficient documentation

## 2015-08-23 DIAGNOSIS — R197 Diarrhea, unspecified: Secondary | ICD-10-CM | POA: Diagnosis not present

## 2015-08-23 DIAGNOSIS — C7802 Secondary malignant neoplasm of left lung: Secondary | ICD-10-CM | POA: Diagnosis not present

## 2015-08-23 DIAGNOSIS — C7951 Secondary malignant neoplasm of bone: Secondary | ICD-10-CM | POA: Diagnosis not present

## 2015-08-23 DIAGNOSIS — R5382 Chronic fatigue, unspecified: Secondary | ICD-10-CM

## 2015-08-23 DIAGNOSIS — C7931 Secondary malignant neoplasm of brain: Secondary | ICD-10-CM | POA: Insufficient documentation

## 2015-08-23 DIAGNOSIS — Z79899 Other long term (current) drug therapy: Secondary | ICD-10-CM | POA: Insufficient documentation

## 2015-08-23 LAB — COMPREHENSIVE METABOLIC PANEL
ALK PHOS: 137 U/L — AB (ref 38–126)
ALT: 52 U/L (ref 17–63)
AST: 33 U/L (ref 15–41)
Albumin: 3.1 g/dL — ABNORMAL LOW (ref 3.5–5.0)
Anion gap: 10 (ref 5–15)
BUN: 19 mg/dL (ref 6–20)
CALCIUM: 8.9 mg/dL (ref 8.9–10.3)
CO2: 27 mmol/L (ref 22–32)
CREATININE: 0.8 mg/dL (ref 0.61–1.24)
Chloride: 102 mmol/L (ref 101–111)
GFR calc non Af Amer: >60 mL/min (ref >60)
GLUCOSE: 100 mg/dL — AB (ref 65–99)
Potassium: 3.1 mmol/L — ABNORMAL LOW (ref 3.5–5.1)
SODIUM: 139 mmol/L (ref 135–145)
Total Bilirubin: 0.4 mg/dL (ref 0.3–1.2)
Total Protein: 7.7 g/dL (ref 6.5–8.1)

## 2015-08-23 LAB — CBC WITH DIFFERENTIAL/PLATELET
BASOS PCT: 1 %
Basophils Absolute: 0.1 10*3/uL (ref 0–0.1)
EOS ABS: 0.4 10*3/uL (ref 0–0.7)
EOS PCT: 4 %
HCT: 35.7 % — ABNORMAL LOW (ref 40.0–52.0)
Hemoglobin: 11.7 g/dL — ABNORMAL LOW (ref 13.0–18.0)
Lymphocytes Relative: 20 %
Lymphs Abs: 1.8 10*3/uL (ref 1.0–3.6)
MCH: 24 pg — ABNORMAL LOW (ref 26.0–34.0)
MCHC: 32.9 g/dL (ref 32.0–36.0)
MCV: 73.2 fL — ABNORMAL LOW (ref 80.0–100.0)
MONO ABS: 1 10*3/uL (ref 0.2–1.0)
MONOS PCT: 11 %
NEUTROS PCT: 64 %
Neutro Abs: 5.8 10*3/uL (ref 1.4–6.5)
PLATELETS: 305 10*3/uL (ref 150–440)
RBC: 4.87 MIL/uL (ref 4.40–5.90)
RDW: 17.7 % — AB (ref 11.5–14.5)
WBC: 9.1 10*3/uL (ref 3.8–10.6)

## 2015-08-23 MED ORDER — DENOSUMAB 120 MG/1.7ML ~~LOC~~ SOLN
120.0000 mg | Freq: Once | SUBCUTANEOUS | Status: AC
  Administered 2015-08-23: 120 mg via SUBCUTANEOUS
  Filled 2015-08-23: qty 1.7

## 2015-08-23 MED ORDER — GADOBENATE DIMEGLUMINE 529 MG/ML IV SOLN
15.0000 mL | Freq: Once | INTRAVENOUS | Status: AC | PRN
  Administered 2015-08-23: 12 mL via INTRAVENOUS

## 2015-08-23 NOTE — Progress Notes (Signed)
Patient reports he continues to have a head cold. "Cough, runny nose" and SOB on exertion. States his diarrhea has decreased to 1-2 times a day and he is taking Lomotil.

## 2015-08-23 NOTE — Progress Notes (Signed)
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  [place] just [place].  Given the recent diarrhea patient has been off the fact that for the last 2 weeks. Diarrhea is resolved.  He currently has intermittent cough; runny nose hoarse voice. Denies any unusual shortness of breath or chest pain. . No abdominal pain nausea vomiting.  Complains of chronic fatigue. No abdominal pain. No worsening pain. No seizures.  [place]; he will come back in the first week of February.   REVIEW OF SYSTEMS:  A complete 10 point review of system is done which is negative except mentioned above/history of present illness.   PAST MEDICAL HISTORY :  Past Medical History  Diagnosis Date  . HTN (hypertension)   . Brain lesion   . 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 History of radiation therapy   [place]    PAST SURGICAL HISTORY :   Past Surgical History  Procedure Laterality Date  . Laminectomy N/A 03/07/2014    Procedure: T1-T2 Laminectomy with Decompression of Cord and Removal of Cancer  thoracic one/two;  Surgeon: HeKristeen MissMD;  [facility];  Service: Neurosurgery;  Laterality: N/A;  . Ct guided biopsy  ([facility] hx)  05/28/2014    FAMILY HISTORY :   Family History  Problem Relation Age of Onset  . Cancer Mother   . Heart disease Father   . Hypertension Sister   . Hypertension Brother     SOCIAL HISTORY:   Social History  Substance Use Topics  . Smoking status: Never Smoker   . Smokeless tobacco: Never Used  . Alcohol Use: No     Comment: none for a year    ALLERGIES:  has No Known Allergies.  MEDICATIONS:  Current Outpatient Prescriptions  Medication Sig Dispense Refill  . diphenoxylate-atropine (LOMOTIL) 2.5-0.025 MG tablet Take 1 tablet by mouth 4 (four) times daily as needed for diarrhea or loose stools (take it along with imoidum for diarrhea). 60 tablet 0  . hydrochlorothiazide (HYDRODIURIL) 25 MG tablet Take 25 mg by mouth daily.    . metoprolol succinate (TOPROL-XL) 50 MG 24 hr tablet Take 50 mg by mouth daily.     . potassium chloride (K-DUR,KLOR-CON) 10 MEQ tablet Take 1 tablet (10 mEq total) by mouth 2 (two) times daily. 60 tablet 3  . prochlorperazine (COMPAZINE) 10 MG tablet Take 1 tablet (10 mg  total) by mouth every 6 (six) hours as needed for nausea or vomiting. 90 tablet 1  . Loperamide HCl (IMODIUM PO) Take 1 tablet by mouth as needed (diarrhea and subsequently after each loose stool). Reported on 08/23/2015    . osimertinib mesylate (TAGRISSO) 80 MG tablet Take 1 tablet (80 mg total) by mouth daily. With or without food. (Patient not taking: Reported on 07/02/2015) 30 tablet 3   No current facility-administered medications for this visit.    PHYSICAL EXAMINATION: ECOG PERFORMANCE STATUS: 1 - Symptomatic but completely  ambulatory  BP 122/86 mmHg  Pulse 103  Temp(Src) 97.2 F (36.2 C) (Tympanic)  Resp 18  Ht '5\' 7"'$  (1.702 m)  Wt 134 lb 9.5 oz (61.05 kg)  BMI 21.07 kg/m2  SpO2 98%   GENERAL: Well-nourished well-developed; Alert, no distress and comfortable.He is accompanied by his son. EYES: no pallor or icterus OROPHARYNX: no thrush or ulceration; good dentition  NECK: supple, no masses felt LYMPH: no palpable lymphadenopathy in the cervical, axillary or inguinal regions LUNGS: clear to auscultation and No wheeze or crackles HEART/CVS: regular rate & rhythm and no murmurs; No lower extremity edema ABDOMEN:abdomen soft, non-tender and normal bowel sounds Musculoskeletal:no cyanosis of digits and no clubbing  PSYCH: alert & oriented x 3; slow speech. NEURO: no focal motor/sensory deficits;  SKIN: no rashes or significant lesions  Filed Weights   08/23/15 1342  Weight: 134 lb 9.5 oz (61.05 kg)    LABORATORY DATA:  I have reviewed the data as listed    Component Value Date/Time   NA 135 07/26/2015 0841   NA 141 11/08/2014 1505   K 3.3* 07/26/2015 0841   K 3.7 11/08/2014 1505   CL 104 07/26/2015 0841   CL 106 11/08/2014 1505   CO2 27 07/26/2015 0841   CO2 29 11/08/2014 1505   GLUCOSE 143* 07/26/2015 0841   GLUCOSE 75 11/08/2014 1505   BUN 12 07/26/2015 0841   BUN 13 11/08/2014 1505   CREATININE 0.92 07/26/2015 0841   CREATININE 1.15 11/08/2014 1505   CALCIUM 8.7* 07/26/2015 0841   CALCIUM 9.1 11/08/2014 1505   PROT 7.9 07/26/2015 0841   PROT 7.7 11/08/2014 1505   ALBUMIN 3.5 07/26/2015 0841   ALBUMIN 4.0 11/08/2014 1505   AST 56* 07/26/2015 0841   AST 31 11/08/2014 1505   ALT 72* 07/26/2015 0841   ALT 53 11/08/2014 1505   ALKPHOS 135* 07/26/2015 0841   ALKPHOS 62 11/08/2014 1505   BILITOT 0.8 07/26/2015 0841   BILITOT 0.6 11/08/2014 1505   GFRNONAA >60 07/26/2015 0841   GFRNONAA >60 11/08/2014 1505   GFRNONAA >60 08/01/2014 1023   GFRAA >60 07/26/2015 0841   GFRAA  >60 11/08/2014 1505   GFRAA >60 08/01/2014 1023    No results found for: SPEP, UPEP  Lab Results  Component Value Date   WBC 9.1 08/23/2015   NEUTROABS 5.8 08/23/2015   HGB 11.7* 08/23/2015   HCT 35.7* 08/23/2015   MCV 73.2* 08/23/2015   PLT 305 08/23/2015      Chemistry      Component Value Date/Time   NA 135 07/26/2015 0841   NA 141 11/08/2014 1505   K 3.3* 07/26/2015 0841   K 3.7 11/08/2014 1505   CL 104 07/26/2015 0841   CL 106 11/08/2014 1505   CO2 27 07/26/2015 0841   CO2 29 11/08/2014 1505   BUN 12 07/26/2015 0841   BUN 13 11/08/2014 1505   CREATININE 0.92 07/26/2015 0841  CREATININE 1.15 11/08/2014 1505      Component Value Date/Time   CALCIUM 8.7* 07/26/2015 0841   CALCIUM 9.1 11/08/2014 1505   ALKPHOS 135* 07/26/2015 0841   ALKPHOS 62 11/08/2014 1505   AST 56* 07/26/2015 0841   AST 31 11/08/2014 1505   ALT 72* 07/26/2015 0841   ALT 53 11/08/2014 1505   BILITOT 0.8 07/26/2015 0841   BILITOT 0.6 11/08/2014 1505       RADIOGRAPHIC STUDIES: CT scan reviewed as discussed below  ASSESSMENT & PLAN:   # Metastatic adenocarcinoma of the lung with EGFR mutation/currently on SECOND LINE therapy with AFATINIB [started Nov 2016]- progression noted on imaging in November 2016. Patient positive for T7 48 M mutation. Given the insurance issues/[place]- finally [facility] Nip is available.  # Recommend starting Tagrisso starting today. Tagrisso is supposed to have approximately 70% response rates. Patient will stop taking his afatinib.   # Left upper lobe mass- status post radiation. S/p Improvement- on CT. I reviewed the images myself reviewed the images with the patient in detail.  # Bone lesions- increasing size of the L1 vertebral body lesion/asymptomatic at this time. Recommend continued X-Geva every 6 weeks. Hopefully Tagrisso will help shrink the bone lesions also.  # Brain lesions- slight increase in number of the brain lesions but no  edema/asymptomatic. Patient had previous whole brain RT. Tagrisso should also have intracranial responses. Recommend evaluation with Dr. Donella Stade.  # Diarrhea grade 1-2;- from afatinib improved.  # Elevated LFTs- likely secondary to Iressa; improved.  # Patient will have labs done in 3 weeks/follow-up.  # 40 minutes face-to-face with the patient discussing the above plan of care; more than 50% of time spent on prognosis/ natural history; counseling and coordination.     Cammie Sickle, MD 08/23/2015 1:57 PM

## 2015-08-27 ENCOUNTER — Ambulatory Visit: Payer: BLUE CROSS/BLUE SHIELD | Admitting: Radiation Oncology

## 2015-08-27 ENCOUNTER — Inpatient Hospital Stay
Admission: RE | Admit: 2015-08-27 | Payer: BLUE CROSS/BLUE SHIELD | Source: Ambulatory Visit | Admitting: Radiation Oncology

## 2015-08-28 ENCOUNTER — Encounter: Payer: Self-pay | Admitting: Radiation Oncology

## 2015-08-28 ENCOUNTER — Ambulatory Visit
Admission: RE | Admit: 2015-08-28 | Discharge: 2015-08-28 | Disposition: A | Discharge status: Home / Self Care | Payer: BLUE CROSS/BLUE SHIELD | Source: Ambulatory Visit | Attending: Radiation Oncology | Admitting: Radiation Oncology

## 2015-08-28 VITALS — BP 115/80 | HR 85 | Temp 97.8°F | Resp 18 | Wt 132.9 lb

## 2015-08-28 DIAGNOSIS — Z510 Encounter for antineoplastic radiation therapy: Secondary | ICD-10-CM | POA: Insufficient documentation

## 2015-08-28 DIAGNOSIS — C3490 Malignant neoplasm of unspecified part of unspecified bronchus or lung: Secondary | ICD-10-CM | POA: Insufficient documentation

## 2015-08-28 DIAGNOSIS — C7951 Secondary malignant neoplasm of bone: Secondary | ICD-10-CM | POA: Insufficient documentation

## 2015-08-28 DIAGNOSIS — C7931 Secondary malignant neoplasm of brain: Secondary | ICD-10-CM

## 2015-08-28 NOTE — Progress Notes (Signed)
Radiation Oncology Follow up Note  Name: Luis [name]   Date:   08/28/2015 MRN:  [id number] DOB: Feb 05, 1966    This 50 y.o. male presents to the clinic today for progressive brain metastasis.  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 Also had palliative radiation therapy to his thoracic spine T1-T5 inclusive. recently started Tagrisso  a targeted therapy which she is tolerating well. Recently had repeat brain MRI scan showing increased size and number of small cerebral and cerebellar metastasis. Right cerebellar metastasis which was boosted appears unchanged. He's having no headaches change in visual fields or any motor or sensory levels at this point. I been asked to evaluate the patient for possible further palliative radiation therapy to his brain.  FOLLOW UP COMPLIANCE: keeps appointments     COMPLICATIONS OF TREATMENT: none   PHYSICAL EXAM:  BP 115/80 mmHg  Pulse 85  Temp(Src) 97.8 F (36.6 C)  Resp 18  Wt 132 lb 15 oz (60.3 kg) Thin slightly cachectic male in NAD cranial nerves II through XII are grossly intact motor sensory and DTR levels are equal and symmetric in upper lower extremities. Crude visual fields are within normal range. Well-developed well-nourished patient in NAD. HEENT reveals PERLA, EOMI, discs not visualized.  Oral cavity is clear. No oral mucosal lesions are identified. Neck is clear without evidence of cervical or supraclavicular adenopathy. Lungs are clear to A&P. Cardiac examination is essentially unremarkable with regular rate and rhythm without murmur rub or thrill. Abdomen is benign with no organomegaly or masses noted. Motor sensory and DTR levels are equal and symmetric in the upper and lower  extremities. Cranial nerves II through XII are grossly intact. Proprioception is intact. No peripheral adenopathy or edema is identified. No motor or sensory levels are noted. Crude visual fields are within normal range.  RADIOLOGY RESULTS: MRI of brain is reviewed  PLAN: At this time since the patient is sent asymptomatic from a neurologic standpoint I would opt to leave him alone and reserving any further palliative treatment to his brain should he progress and have neurologic symptoms. His current targeted therapy does cross the blood-brain barrier and may show response to these lesions. I have discussed the case personally with medical oncology who is in agreement with my treatment plan. I will follow up with patient accordingly and he continues close follow-up care and targeted therapy under the direction of medical oncology.  I would like to take this opportunity for allowing me to participate in the care of your patient.Armstead Peaks., MD

## 2015-09-03 ENCOUNTER — Other Ambulatory Visit: Payer: Self-pay | Admitting: Internal Medicine

## 2015-09-03 DIAGNOSIS — C7931 Secondary malignant neoplasm of brain: Secondary | ICD-10-CM

## 2015-09-03 MED ORDER — DEXAMETHASONE 4 MG PO TABS: 8.0000 mg | ORAL_TABLET | Freq: Three times a day (TID) | ORAL | Status: DC

## 2015-09-03 NOTE — Progress Notes (Signed)
Patient complains of weakness in his left upper extremity; on exam no focal deficits no objective weakness noted. Recommend- dexamethasone 8 mg 3 times a day x2 days; followed by dexamethasone 4 mg 3 times a day. If symptoms get worse recommend going to the emergency room. Patient just has just been started on Tagriso- which also has response rates in the brain. Patient has previous brain metastases status post radiation.

## 2015-09-10 ENCOUNTER — Inpatient Hospital Stay
Admission: EM | Admit: 2015-09-10 | Discharge: 2015-09-12 | DRG: 055 | Disposition: A | Discharge status: Home / Self Care | Payer: BLUE CROSS/BLUE SHIELD | Attending: Internal Medicine | Admitting: Internal Medicine

## 2015-09-10 ENCOUNTER — Emergency Department: Payer: BLUE CROSS/BLUE SHIELD

## 2015-09-10 ENCOUNTER — Encounter: Payer: Self-pay | Admitting: Emergency Medicine

## 2015-09-10 DIAGNOSIS — R202 Paresthesia of skin: Secondary | ICD-10-CM

## 2015-09-10 DIAGNOSIS — C7951 Secondary malignant neoplasm of bone: Secondary | ICD-10-CM | POA: Diagnosis present

## 2015-09-10 DIAGNOSIS — C3490 Malignant neoplasm of unspecified part of unspecified bronchus or lung: Secondary | ICD-10-CM | POA: Diagnosis present

## 2015-09-10 DIAGNOSIS — C7802 Secondary malignant neoplasm of left lung: Secondary | ICD-10-CM | POA: Diagnosis not present

## 2015-09-10 DIAGNOSIS — Z79899 Other long term (current) drug therapy: Secondary | ICD-10-CM

## 2015-09-10 DIAGNOSIS — C7931 Secondary malignant neoplasm of brain: Secondary | ICD-10-CM | POA: Diagnosis present

## 2015-09-10 DIAGNOSIS — C3411 Malignant neoplasm of upper lobe, right bronchus or lung: Secondary | ICD-10-CM

## 2015-09-10 DIAGNOSIS — R490 Dysphonia: Secondary | ICD-10-CM

## 2015-09-10 DIAGNOSIS — E876 Hypokalemia: Secondary | ICD-10-CM | POA: Diagnosis present

## 2015-09-10 DIAGNOSIS — R531 Weakness: Secondary | ICD-10-CM

## 2015-09-10 DIAGNOSIS — Z510 Encounter for antineoplastic radiation therapy: Secondary | ICD-10-CM | POA: Diagnosis present

## 2015-09-10 DIAGNOSIS — E871 Hypo-osmolality and hyponatremia: Secondary | ICD-10-CM | POA: Diagnosis present

## 2015-09-10 DIAGNOSIS — E875 Hyperkalemia: Secondary | ICD-10-CM | POA: Diagnosis present

## 2015-09-10 DIAGNOSIS — R200 Anesthesia of skin: Secondary | ICD-10-CM

## 2015-09-10 DIAGNOSIS — R29898 Other symptoms and signs involving the musculoskeletal system: Secondary | ICD-10-CM | POA: Diagnosis present

## 2015-09-10 DIAGNOSIS — R05 Cough: Secondary | ICD-10-CM

## 2015-09-10 DIAGNOSIS — I10 Essential (primary) hypertension: Secondary | ICD-10-CM | POA: Diagnosis present

## 2015-09-10 LAB — MAGNESIUM: Magnesium: 2.1 mg/dL (ref 1.7–2.4)

## 2015-09-10 LAB — CBC WITH DIFFERENTIAL/PLATELET
BASOS ABS: 0 10*3/uL (ref 0–0.1)
BASOS PCT: 0 %
Eosinophils Absolute: 0.6 10*3/uL (ref 0–0.7)
Eosinophils Relative: 5 %
HEMATOCRIT: 39.1 % — AB (ref 40.0–52.0)
HEMOGLOBIN: 12.2 g/dL — AB (ref 13.0–18.0)
LYMPHS PCT: 13 %
Lymphs Abs: 1.6 10*3/uL (ref 1.0–3.6)
MCH: 23.5 pg — ABNORMAL LOW (ref 26.0–34.0)
MCHC: 31.2 g/dL — ABNORMAL LOW (ref 32.0–36.0)
MCV: 75.3 fL — AB (ref 80.0–100.0)
MONO ABS: 1.2 10*3/uL — AB (ref 0.2–1.0)
Monocytes Relative: 9 %
NEUTROS ABS: 9.3 10*3/uL — AB (ref 1.4–6.5)
NEUTROS PCT: 73 %
Platelets: 312 10*3/uL (ref 150–440)
RBC: 5.19 MIL/uL (ref 4.40–5.90)
RDW: 19.2 % — AB (ref 11.5–14.5)
WBC: 12.8 10*3/uL — ABNORMAL HIGH (ref 3.8–10.6)

## 2015-09-10 LAB — BASIC METABOLIC PANEL
Anion gap: 6 (ref 5–15)
BUN: 19 mg/dL (ref 6–20)
CHLORIDE: 100 mmol/L — AB (ref 101–111)
CO2: 26 mmol/L (ref 22–32)
CREATININE: 0.99 mg/dL (ref 0.61–1.24)
Calcium: 7.9 mg/dL — ABNORMAL LOW (ref 8.9–10.3)
GFR calc non Af Amer: >60 mL/min (ref >60)
Glucose, Bld: 86 mg/dL (ref 65–99)
POTASSIUM: 3.5 mmol/L (ref 3.5–5.1)
Sodium: 132 mmol/L — ABNORMAL LOW (ref 135–145)

## 2015-09-10 LAB — PHOSPHORUS: PHOSPHORUS: 3.7 mg/dL (ref 2.5–4.6)

## 2015-09-10 MED ORDER — POTASSIUM CHLORIDE CRYS ER 10 MEQ PO TBCR
10.0000 meq | EXTENDED_RELEASE_TABLET | Freq: Two times a day (BID) | ORAL | Status: DC
  Administered 2015-09-10 – 2015-09-11 (×2): 10 meq via ORAL
  Filled 2015-09-10 (×2): qty 1

## 2015-09-10 MED ORDER — OSIMERTINIB MESYLATE 80 MG PO TABS
80.0000 mg | ORAL_TABLET | Freq: Every day | ORAL | Status: DC
  Filled 2015-09-10: qty 1

## 2015-09-10 MED ORDER — ACETAMINOPHEN 325 MG PO TABS: 650.0000 mg | ORAL_TABLET | Freq: Four times a day (QID) | ORAL | Status: DC | PRN

## 2015-09-10 MED ORDER — SODIUM CHLORIDE 0.9 % IV SOLN
2.0000 g | Freq: Once | INTRAVENOUS | Status: AC
  Administered 2015-09-10: 2 g via INTRAVENOUS
  Filled 2015-09-10: qty 20

## 2015-09-10 MED ORDER — DEXAMETHASONE SODIUM PHOSPHATE 10 MG/ML IJ SOLN: 10.0000 mg | Freq: Four times a day (QID) | INTRAMUSCULAR | Status: DC

## 2015-09-10 MED ORDER — ENOXAPARIN SODIUM 40 MG/0.4ML ~~LOC~~ SOLN
40.0000 mg | SUBCUTANEOUS | Status: DC
  Administered 2015-09-10 – 2015-09-12 (×3): 40 mg via SUBCUTANEOUS
  Filled 2015-09-10 (×4): qty 0.4

## 2015-09-10 MED ORDER — METOPROLOL SUCCINATE ER 50 MG PO TB24
50.0000 mg | ORAL_TABLET | Freq: Every day | ORAL | Status: DC
  Administered 2015-09-11 – 2015-09-12 (×2): 50 mg via ORAL
  Filled 2015-09-10 (×2): qty 1

## 2015-09-10 MED ORDER — POTASSIUM CHLORIDE CRYS ER 20 MEQ PO TBCR
40.0000 meq | EXTENDED_RELEASE_TABLET | Freq: Once | ORAL | Status: AC
  Administered 2015-09-10: 18:00:00 40 meq via ORAL
  Filled 2015-09-10: qty 2

## 2015-09-10 MED ORDER — PROCHLORPERAZINE MALEATE 10 MG PO TABS
10.0000 mg | ORAL_TABLET | Freq: Four times a day (QID) | ORAL | Status: DC | PRN
  Filled 2015-09-10: qty 1

## 2015-09-10 MED ORDER — ACETAMINOPHEN 650 MG RE SUPP: 650.0000 mg | Freq: Four times a day (QID) | RECTAL | Status: DC | PRN

## 2015-09-10 MED ORDER — LEVETIRACETAM 500 MG PO TABS
500.0000 mg | ORAL_TABLET | Freq: Two times a day (BID) | ORAL | Status: DC
  Administered 2015-09-10 – 2015-09-12 (×4): 500 mg via ORAL
  Filled 2015-09-10 (×5): qty 1

## 2015-09-10 MED ORDER — DIPHENOXYLATE-ATROPINE 2.5-0.025 MG PO TABS
1.0000 | ORAL_TABLET | Freq: Four times a day (QID) | ORAL | Status: DC | PRN
  Administered 2015-09-11: 1 via ORAL
  Filled 2015-09-10: qty 1

## 2015-09-10 MED ORDER — OXYCODONE HCL 5 MG PO TABS
5.0000 mg | ORAL_TABLET | ORAL | Status: DC | PRN
  Administered 2015-09-11: 17:00:00 5 mg via ORAL
  Filled 2015-09-10: qty 1

## 2015-09-10 MED ORDER — METHYLPREDNISOLONE SODIUM SUCC 125 MG IJ SOLR
125.0000 mg | Freq: Once | INTRAMUSCULAR | Status: AC
  Administered 2015-09-10: 125 mg via INTRAVENOUS
  Filled 2015-09-10: qty 2

## 2015-09-10 MED ORDER — DEXAMETHASONE SODIUM PHOSPHATE 10 MG/ML IJ SOLN
4.0000 mg | Freq: Four times a day (QID) | INTRAMUSCULAR | Status: DC
  Administered 2015-09-10 – 2015-09-12 (×8): 4 mg via INTRAVENOUS
  Filled 2015-09-10 (×8): qty 1

## 2015-09-10 MED ORDER — OSIMERTINIB MESYLATE 80 MG PO TABS
80.0000 mg | ORAL_TABLET | Freq: Every day | ORAL | Status: DC
Start: 2015-09-10 — End: 2015-09-12
  Administered 2015-09-10 – 2015-09-11 (×2): 80 mg via ORAL

## 2015-09-10 NOTE — ED Notes (Signed)
L hand numbness x 1 week. States has brain and lung cancer, currently getting chemo. States spoke to oncologist and told him to come to ED.

## 2015-09-10 NOTE — ED Provider Notes (Signed)
[facility] Emergency Department Provider Note  ____________________________________________  Time seen: 11:15 AM  I have reviewed the triage vital signs and the nursing notes.   HISTORY  Chief Complaint Numbness    HPI Luis [name] is a 50 y.o. male complains of left hand numbness for the past week, worsened today. He is being treated by Dr. Rogue Bussing for lung cancer which is metastatic to the brain and spine. He was started on some oral steroids a few days ago without relief. Denies any falls or any trauma. Denies weakness. Normal ambulation coordination and gait. Denies headache     Past Medical History  Diagnosis Date  . HTN (hypertension)   . Brain lesion   . 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 History of radiation therapy   . Adenocarcinoma of lung ([place]) 12/05/2014     Patient Active Problem List   Diagnosis Date Noted  . Adenocarcinoma of lung ([place]) 12/05/2014  . Spinal cord tumor ([place]) 03/08/2014  . Essential hypertension, benign 03/08/2014  . Metastasis ([place]) 03/08/2014  . Weakness 03/06/2014     Past Surgical History  Procedure Laterality Date  . Laminectomy N/A 03/07/2014    Procedure: T1-T2 Laminectomy with Decompression of Cord and Removal of Cancer  thoracic one/two;  Surgeon: Kristeen Miss, MD;  Location: [place] [facility];  Service: Neurosurgery;  Laterality: N/A;  . Ct guided biopsy  ([facility] hx)  05/28/2014     Current Outpatient Rx  Name  Route  Sig  Dispense  Refill  . dexamethasone (DECADRON) 4 MG tablet   Oral   Take 2 tablets (8 mg total) by mouth 3 (three) times daily. X 2 days; and then 1 pill three times a day.   30 tablet   0   . diphenoxylate-atropine (LOMOTIL) 2.5-0.025 MG tablet   Oral   Take 1 tablet by mouth 4 (four) times daily as needed for diarrhea or loose stools (take it along with imoidum for diarrhea).   60 tablet   0   . hydrochlorothiazide (HYDRODIURIL) 25 MG tablet   Oral   Take 25 mg by mouth daily.         . metoprolol succinate (TOPROL-XL) 50 MG 24 hr tablet   Oral   Take 50 mg by mouth daily.          Marland Kitchen osimertinib mesylate (TAGRISSO) 80 MG tablet   Oral   Take 1 tablet (80 mg total) by mouth daily. With or without food.   30 tablet   3   . potassium chloride (K-DUR,KLOR-CON) 10 MEQ tablet   Oral   Take 1 tablet (10 mEq total) by mouth 2 (two) times daily.   60 tablet   3   . prochlorperazine (COMPAZINE) 10 MG tablet   Oral   Take 1 tablet (10 mg total) by mouth every 6 (six) hours as needed for nausea or vomiting.   90 tablet   1   . lisinopril (PRINIVIL,ZESTRIL) 40 MG tablet            1   . Loperamide HCl (IMODIUM PO)   Oral   Take 1 tablet by mouth as needed (diarrhea and subsequently after each loose stool). Reported on 08/23/2015            Allergies Review of patient's allergies indicates no known allergies.   Family History  Problem Relation Age of Onset  . Cancer Mother   . Heart disease Father   . Hypertension Sister   .  Hypertension Brother     Social History Social History  Substance Use Topics  . Smoking status: Never Smoker   . Smokeless tobacco: Never Used  . Alcohol Use: No     Comment: none for a year    Review of Systems  Constitutional:   No fever or chills. No weight changes Eyes:   No blurry vision or double vision.  ENT:   No sore throat.  Cardiovascular:   No chest pain. Respiratory:   No dyspnea or cough. Gastrointestinal:   Negative for abdominal pain, vomiting and diarrhea.  No BRBPR or melena. Genitourinary:   Negative for dysuria or difficulty urinating. Musculoskeletal:   Negative for back pain. No joint swelling or pain. Skin:   Negative for rash. Neurological:   Positive left hand numbness. Psychiatric:  No anxiety or depression.   Endocrine:  No changes in energy or sleep difficulty.  10-point ROS otherwise negative.  ____________________________________________   PHYSICAL EXAM:  VITAL SIGNS: ED  Triage Vitals  Enc Vitals Group     BP 09/10/15 1047 121/78 mmHg     Pulse Rate 09/10/15 1047 71     Resp 09/10/15 1047 20     Temp 09/10/15 1047 98.1 F (36.7 C)     Temp Source 09/10/15 1047 Oral     SpO2 09/10/15 1047 100 %     Weight 09/10/15 1047 135 lb (61.236 kg)     Height 09/10/15 1047 '5\' 7"'$  (1.702 m)     Head Cir --      Peak Flow --      Pain Score 09/10/15 1048 2     Pain Loc --      Pain Edu? --      Excl. in [place]? --     Vital signs reviewed, nursing assessments reviewed.   Constitutional:   Alert and oriented. No distress. Eyes:   No scleral icterus. No conjunctival pallor. PERRL. EOMI ENT   Head:   Normocephalic and atraumatic.   Nose:   No congestion/rhinnorhea. No septal hematoma   Mouth/Throat:   MMM, no pharyngeal erythema. No peritonsillar mass.    Neck:   No stridor. No SubQ emphysema. No meningismus. Hematological/Lymphatic/Immunilogical:   No cervical lymphadenopathy. Cardiovascular:   RRR. Symmetric bilateral radial and DP pulses.  No murmurs.  Respiratory:   Normal respiratory effort without tachypnea nor retractions. Breath sounds are clear and equal bilaterally. No wheezes/rales/rhonchi. Gastrointestinal:   Soft and nontender. Non distended. There is no CVA tenderness.  No rebound, rigidity, or guarding. Genitourinary:   deferred Musculoskeletal:   Nontender with normal range of motion in all extremities. No joint effusions.  No lower extremity tenderness.  No edema. Neurologic:   Normal speech and language.  CN 2-10 normal. Decreased sensation in the left hand Left upper extremity relatively weak compared to the right when trying to push me away. Other major muscle groups are symmetric and strong. Isolating the triceps, it appears to have normal strength as well.. No gross focal neurologic deficits are appreciated.  Skin:    Skin is warm, dry and intact. No rash noted.  No petechiae, purpura, or bullae. Psychiatric:   Mood and affect  are normal. ____________________________________________    LABS (pertinent positives/negatives) (all labs ordered are listed, but only abnormal results are displayed) Labs Reviewed  BASIC METABOLIC PANEL - Abnormal; Notable for the following:    Sodium 132 (*)    Chloride 100 (*)    Calcium 7.9 (*)  All other components within normal limits  CBC WITH DIFFERENTIAL/PLATELET - Abnormal; Notable for the following:    WBC 12.8 (*)    Hemoglobin 12.2 (*)    HCT 39.1 (*)    MCV 75.3 (*)    MCH 23.5 (*)    MCHC 31.2 (*)    RDW 19.2 (*)    Neutro Abs 9.3 (*)    Monocytes Absolute 1.2 (*)    All other components within normal limits  MAGNESIUM  PHOSPHORUS   ____________________________________________   EKG    ____________________________________________    RADIOLOGY  CT head shows no evidence of infarct or hemorrhage. There is a lesion in the right cerebellum corresponding to known metastasis, also bony metastases in the left frontal bone.  ____________________________________________   PROCEDURES   ____________________________________________   INITIAL IMPRESSION / ASSESSMENT AND PLAN / ED COURSE  Pertinent labs & imaging results that were available during my care of the patient were reviewed by me and considered in my medical decision making (see chart for details).  Patient presents with neurologic complaints of left upper extremity in the setting of metastatic cancer. Concern for stroke versus mass affect. We'll check CT and labs.  ----------------------------------------- 1:09 PM on 09/10/2015 -----------------------------------------  No evidence of infarct or hemorrhage on CT. Labs do show a slight hypocalcemia below his usual baseline. Case discussed with Dr. Rogue Bussing who recommends hospitalization for IV steroids and MRI brain with contrast due to the worsened neurologic symptoms that are likely due to his brain metastases. Also agrees with repeating  the low calcium at this time.     ____________________________________________   FINAL CLINICAL IMPRESSION(S) / ED DIAGNOSES  Final diagnoses:  Numbness and tingling in left hand  Hypocalcemia  Brain metastases [facility])      Carrie Mew, MD 09/10/15 1310

## 2015-09-10 NOTE — Consult Note (Signed)
[facility] CONSULT NOTE  Patient Care Team: Provider Not In System as PCP - General  CHIEF COMPLAINTS/PURPOSE OF CONSULTATION:  Lung cancer/ brain mets- left UE weakness.  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   # Bony mets- on X-geva q 6 w  ------------------------------------------------------------------------------------   HISTORY OF PRESENTING ILLNESS:  Luis [name] [age].o.  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  Patient started Tagrisso approximately 2 weeks ago. Patient was also started on steroids last week dexamethasone 3 times a day. Patient noted to have worsening weakness of his left upper extremity; denies any seizure-like activity. Denies any double vision. Denies any worsening headaches.  In the emergency room CT scan/noncontrast of the brain did not show any- significantly worsening brain metastases/showed stable cerebellar met; with frontal skull metastases. Patient is currently on IV steroids; awaiting an MRI of the brain for  further evaluation of his metastases.  He currently has intermittent cough; runny nose hoarse voice. Denies any unusual shortness of breath or chest pain. . No abdominal pain nausea vomiting. Complains of chronic fatigue. No abdominal pain. No worsening pain. No seizures.  ROS: A complete 10 point review of system is done which is negative except mentioned above in history of present illness  MEDICAL HISTORY:  Past Medical History  Diagnosis Date  . HTN (hypertension)   . Brain lesion   . 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 History of radiation therapy   . [place] [facility] [place]    SURGICAL HISTORY: Past Surgical History  Procedure Laterality Date  . Laminectomy N/A 03/07/2014    Procedure: T1-T2 Laminectomy with Decompression of Cord and Removal of Cancer  thoracic one/two;  Surgeon: HeKristeen MissMD;  [facility];  Service: Neurosurgery;  Laterality: N/A;  . Ct guided biopsy  ([facility] hx)  05/28/2014    SOCIAL HISTORY: Social History   Social History  . Marital Status: Married    Spouse Name: N/A  . Number of Children: N/A  . Years of Education: N/A   Occupational History  . Not on file.   Social History Main Topics  . Smoking status: Never Smoker   . Smokeless tobacco: Never Used  . Alcohol Use: 0.5 oz/week    1 Standard drinks or equivalent per week     Comment: none for a year  . Drug Use: No  . Sexual Activity: Yes   Other Topics Concern  . Not on file   Social History Narrative    FAMILY HISTORY: Family History  Problem Relation Age of Onset  . Colon cancer Mother   . Heart disease Father   . Hypertension Sister   . Hypertension Brother  ALLERGIES:  has No Known Allergies.  MEDICATIONS:  Current Facility-Administered Medications  Medication Dose Route Frequency Provider Last Rate Last Dose  . acetaminophen (TYLENOL) tablet 650 mg  650 mg Oral Q6H PRN Loletha Grayer, MD       Or  . acetaminophen (TYLENOL) suppository 650  mg  650 mg Rectal Q6H PRN Loletha Grayer, MD      . dexamethasone (DECADRON) injection 10 mg  10 mg Intravenous 4 times per day Loletha Grayer, MD      . diphenoxylate-atropine (LOMOTIL) 2.5-0.025 MG per tablet 1 tablet  1 tablet Oral QID PRN Loletha Grayer, MD      . enoxaparin (LOVENOX) injection 40 mg  40 mg Subcutaneous Q24H Loletha Grayer, MD      . levETIRAcetam (KEPPRA) tablet 500 mg  500 mg Oral BID Loletha Grayer, MD      . Derrill Memo ON 09/11/2015] metoprolol succinate (TOPROL-XL) 24 hr tablet 50 mg  50 mg Oral Daily Loletha Grayer, MD      . osimertinib mesylate (TAGRISSO) tablet 80 mg  80 mg Oral Daily Loletha Grayer, MD      . oxyCODONE (Oxy IR/ROXICODONE) immediate release tablet 5 mg  5 mg Oral Q4H PRN Loletha Grayer, MD      . potassium chloride (K-DUR,KLOR-CON) CR tablet 10 mEq  10 mEq Oral BID Loletha Grayer, MD      . potassium chloride SA (K-DUR,KLOR-CON) CR tablet 40 mEq  40 mEq Oral Once Loletha Grayer, MD      . prochlorperazine (COMPAZINE) tablet 10 mg  10 mg Oral Q6H PRN Loletha Grayer, MD          .  PHYSICAL EXAMINATION:   Filed Vitals:   09/10/15 1454 09/10/15 1538  BP:  129/73  Pulse: 61 58  Temp:  98.1 F (36.7 C)  Resp: 19 20   Filed Weights   09/10/15 1047  Weight: 135 lb (61.236 kg)    GENERAL: Well-nourished well-developed; Alert, no distress and comfortable.He is alone. EYES: no pallor or icterus OROPHARYNX: no thrush or ulceration; good dentition  NECK: supple, no masses felt LYMPH: no palpable lymphadenopathy in the cervical, axillary or inguinal regions LUNGS: clear to auscultation and No wheeze or crackles HEART/CVS: regular rate & rhythm and no murmurs; No lower extremity edema ABDOMEN:abdomen soft, non-tender and normal bowel sounds Musculoskeletal:no cyanosis of digits and no clubbing  PSYCH: alert & oriented x 3; slow speech. NEURO: no focal motor/sensory deficits;  SKIN: no rashes or significant lesions LABORATORY  DATA:  I have reviewed the data as listed Lab Results  Component Value Date   WBC 12.8* 09/10/2015   HGB 12.2* 09/10/2015   HCT 39.1* 09/10/2015   MCV 75.3* 09/10/2015   PLT 312 09/10/2015    Recent Labs  02/28/15 1445  06/11/15 1531 06/19/15 1409  07/17/15 1448 07/26/15 0841 08/23/15 1325 09/10/15 1133  NA 135  < >  --   --   < > 134* 135 139 132*  K 3.2*  < >  --   --   < > 3.6 3.3* 3.1* 3.5  CL 104  < >  --   --   < > 102 104 102 100*  CO2 25  < >  --   --   < > 26 27 27 26   GLUCOSE 133*  < >  --   --   < > 94 143* 100* 86  BUN 12  < >  --   --   < >  12 12 19 19   CREATININE 1.01  < >  --   --   < > 0.99 0.92 0.80 0.99  CALCIUM 8.3*  < >  --   --   < > 8.6* 8.7* 8.9 7.9*  GFRNONAA >60  < >  --   --   < > >60 >60 >60 >60  GFRAA >60  < >  --   --   < > >60 >60 >60 >60  PROT 8.0  < > 8.7* 8.1  < > 7.9 7.9 7.7  --   ALBUMIN 3.9  < > 3.3* 3.2*  < > 3.5 3.5 3.1*  --   AST 46*  < > 183* 29  < > 25 56* 33  --   ALT 64*  < > 225* 69*  < > 45 72* 52  --   ALKPHOS 79  < > 222* 163*  < > 109 135* 137*  --   BILITOT 0.5  < > 0.9 0.3  < > 0.5 0.8 0.4  --   BILIDIR <0.1*  --  0.3 <0.1*  --   --   --   --   --   IBILI NOT CALCULATED  --  0.6 NOT CALCULATED  --   --   --   --   --   < > = values in this interval not displayed.  RADIOGRAPHIC STUDIES: I have personally reviewed the radiological images as listed and agreed with the findings in the report. Ct Head Wo Contrast  09/10/2015  CLINICAL DATA:  Left hand numbness for 1 week. History of brain and lung cancer. EXAM: CT HEAD WITHOUT CONTRAST TECHNIQUE: Contiguous axial images were obtained from the base of the skull through the vertex without intravenous contrast. COMPARISON:  MRI of August 23, 2015. FINDINGS: 29 x 17 mm sclerotic density is seen superiorly in left frontal skull which may represent metastatic lesion. Ventricular size is within normal limits. No midline shift is noted. Minimal diffuse cortical atrophy is noted. Mild  chronic ischemic white matter disease is noted. Ill-defined low density is again noted in right cerebellar hemisphere consistent with metastatic disease. No definite hemorrhage is noted. Other metastatic lesions seen on prior MRI are not well visualized on this unenhanced study. IMPRESSION: 2.9 cm sclerotic lesions seen in left frontal skull which may represent metastatic lesion. Ill-defined low density is noted in the right cerebellar hemisphere consistent with metastatic lesion and surrounding edema noted on prior MRI. Electronically Signed   By: Marijo Conception, M.D.   On: 09/10/2015 11:50   Ct Chest W Contrast  08/20/2015  CLINICAL DATA:  Metastatic adenocarcinoma of the lung, currently on second-line therapy. Restaging. EXAM: CT CHEST WITH CONTRAST TECHNIQUE: Multidetector CT imaging of the chest was performed during intravenous contrast administration. CONTRAST:  50m OMNIPAQUE IOHEXOL 300 MG/ML  SOLN COMPARISON:  07/15/2014 FINDINGS: Mediastinum/Nodes: No current pathologic thoracic adenopathy. Lungs/Pleura: Left upper lobe nodule measures 2.1 by 1.6 cm on image 14 series 3, formerly 3.0 by 2.7 cm. The scattered small pulmonary nodules in both lungs are generally similar in size to prior. Several new indistinct nodules in the left lower lobe on images 41 through 42 of series 3 could be inflammatory but merit observation, and seen new compared to the prior exam. Similarly there is a new 5 mm sub solid nodule in the right lower lobe on image 41 series 3. Upper abdomen: Contracted gallbladder. Left kidney upper pole cyst. Adrenal glands unremarkable. Musculoskeletal:  Pattern of extensive osseous metastatic disease including expansile rib lesions and lesions at most thoracic spine levels, and compression fractures at T2, T3, and T5 with posterior bony retropulsion similar to prior. Prior posterior decompression at T1-2. There is an periostitis or epidural tumor extension at T11 as before. The only significant  progression in the osseous metastatic disease is at L1 were the tumor involvement anteriorly measures 2.3 by 1.9 cm, formerly 1.8 by 1.4 cm. The cricoid cartilage appears expanded, possibly with metastatic disease, similar to prior. IMPRESSION: 1. Mixed appearance, with significant reduction in size of the dominant left upper lobe pulmonary nodule, but increase in the apparent size of the L1 vertebral region of involvement compared to the prior exam. Most of the small pulmonary nodules are stable, although there are some new indistinct basilar nodules which could be inflammatory or early neoplastic nodules. Electronically Signed   By: Van Clines M.D.   On: 08/20/2015 13:57   Mr Jeri Cos OT Contrast  08/23/2015  CLINICAL DATA:  Lung cancer with brain metastases. Follow-up. No current complaints. EXAM: MRI HEAD WITHOUT AND WITH CONTRAST TECHNIQUE: Multiplanar, multiecho pulse sequences of the brain and surrounding structures were obtained without and with intravenous contrast. CONTRAST:  15m MULTIHANCE GADOBENATE DIMEGLUMINE 529 MG/ML IV SOLN COMPARISON:  05/24/2015 FINDINGS: There is no evidence of acute infarct, midline shift, or extra-axial fluid collection. There is mild generalized cerebral atrophy. A small amount of chronic blood products are again seen in the cerebellum associated with previously treated metastases. The largest lesion, located in the right cerebellum, is unchanged in size and measures 2.2 x 1.7 cm (series 11, image 11). Numerous subcentimeter lesions are again seen throughout both cerebellar hemispheres. Many of these have mildly increased in size (indicated by double arrows on series 11), for example a 5 mm lesion in the lateral right cerebellum (series 11, image 12, previously 1 mm), a 5 mm lesion in the superior right cerebellum (series 11, image 16, previously 1 mm), and a 6 mm lesion in the posteroinferior left cerebellum (series 11, image 8, previously 3 mm). There is at least 1  new cerebellar lesion, measuring 3 mm in the left hemisphere (series 11, image 13). Mild edema in the medial right cerebellum has not significantly changed. New 2 mm medial right parietal lesion versus vessel (series 11, image 31). New 2 mm high right parietal cortical lesion (series 11, image 42). Unchanged 2.5 mm right parietal lesion slightly more medially (series 11, image 43). The 4 mm high posterior right frontal lobe lesion on the prior study is not clearly identified on the current examination. New 5 mm posterior left temporal lesion in close proximity to the tentorium (series 11, image 17). New 3 mm anteromedial left occipital lesion (series 11, image 20). 6 mm left parietal lesion has enlarged (series 11, image 34, previously 4 mm upon remeasurement). New 3 mm lesion near the genu of the left internal capsule (series 11 image 28). Possible additional 1.5 mm lesion more inferiorly in the left internal capsule (series 12, image 17). The 5 mm anterior inferior left frontal lesion on the prior study is no longer clearly identified. However, there is a new 2-3 mm lesion slightly more superiorly in the anterior left frontal lobe (series 12, image 24 and series 13, image 18). There is no significant edema or mass effect associated with any of the new lesions. 3 cm left parietal skull lesion is unchanged in size and again is without significant enhancement. Orbits are  unremarkable. Mild bilateral ethmoid, mild left sphenoid, and moderate left maxillary sinus mucosal thickening and a moderately large left mastoid effusion are noted. Major intracranial vascular flow voids are preserved. IMPRESSION: 1. Increased size and number of small cerebral and cerebellar metastases as above. No significant edema or mass effect. 2. Unchanged size of largest, previously treated right cerebellar metastasis. 3. Two small frontal lobe lesions on the prior study no longer clearly identified. Electronically Signed   By: Logan Bores  M.D.   On: 08/23/2015 10:21    ASSESSMENT & PLAN:   Metastatic adenocarcinoma of the lung with brain metastases- currently admitted to the hospital for worsening left upper extremity weakness.  # Left upper extremity weakness-mild/no other focal deficits noted. Await MRI of the brain for further evaluation. If significant edema/progression noted- would recommend evaluation with radiation oncology for possible radiation. Otherwise I would continue tagrisso. For now I recommend continued steroids; however changes steroids to 4 mg IV every 8 hours. I also reviewed with Dr. Donella Stade.  #  Metastatic adenocarcinoma of the lung with EGFR mutation- currently on Tagrisso started approximately 2 weeks ago. Tagrisso does have intracranial responses. Continue tagrisso at this time/ pt can use his medication form home.   # Bone lesions- increasing size of the L1 vertebral body lesion/asymptomatic at this time. On X [place]/causing hypocalcemia. Agree with IV calcium supplementation.  Thank you Dr.Wieting  for allowing me to participate in the care of your pleasant patient. Please do not hesitate to contact me with questions or concerns in the interim.         Cammie Sickle, MD 09/10/2015 4:54 PM

## 2015-09-10 NOTE — H&P (Signed)
[facility] [place] NAME: Luis [name]    MR#:  [id number]  DATE OF BIRTH:  1966/07/14  DATE OF ADMISSION:  09/10/2015  PRIMARY CARE PHYSICIAN: Dr. Kriste Basque  REQUESTING/REFERRING PHYSICIAN: Dr. Maisie Fus  CHIEF COMPLAINT:   Chief Complaint  Patient presents with  . Numbness    HISTORY OF PRESENT ILLNESS:  Luis [name]  is a 50 y.o. 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 He presents with weakness of his left hand going on for about 1 week. He was recently seen by oncology and put on steroids. Patient came to the ER for further evaluation. The ER physician spoke with Dr. Burlene Arnt and they recommended IV steroids. Patient on recent MRI of the brain showed increased size and number of the small cerebellar and cerebral metastases.  PAST MEDICAL HISTORY:   Past Medical History  Diagnosis Date  . HTN (hypertension)   . Brain lesion   . 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 History of radiation therapy   . Adenocarcinoma of lung ([facility]) 12/05/2014    PAST SURGICAL HISTORY:   Past Surgical History  Procedure Laterality Date  . Laminectomy N/A 03/07/2014    Procedure: T1-T2 Laminectomy with Decompression of Cord and Removal of Cancer  thoracic one/two;  Surgeon: Kristeen Miss, MD;  Location: [facility];  Service: Neurosurgery;  Laterality: N/A;  . Ct guided biopsy  ([facility] hx)  05/28/2014    SOCIAL HISTORY:   Social History  Substance Use Topics  . Smoking status: Never Smoker   . Smokeless tobacco: Never Used  . Alcohol Use: 0.5 oz/week    1 Standard drinks or equivalent per week     Comment: none for a year    FAMILY HISTORY:   Family History  Problem Relation Age of Onset  . Colon cancer Mother   . Heart disease Father   . Hypertension Sister   . Hypertension Brother     DRUG ALLERGIES:  No Known Allergies  REVIEW OF SYSTEMS:  CONSTITUTIONAL: No fever, left hand weakness. Some  weight loss EYES: No blurred or double vision.  EARS, NOSE, AND THROAT: No tinnitus or ear pain. No sore throat. Some runny nose. RESPIRATORY: Occasional cough and shortness of breath, no wheezing or hemoptysis.  CARDIOVASCULAR: No chest pain, orthopnea, edema.  GASTROINTESTINAL: No nausea, vomiting, diarrhea. Some abdominal pain. No blood in bowel movements GENITOURINARY: No dysuria, hematuria.  ENDOCRINE: No polyuria, nocturia,  HEMATOLOGY: No anemia, easy bruising or bleeding SKIN: No rash or lesion. MUSCULOSKELETAL: No joint pain or arthritis.   NEUROLOGIC: No tingling, numbness, weakness.  PSYCHIATRY: Positive for anxiety and depression.   MEDICATIONS AT HOME:   Prior to Admission medications   Medication Sig Start Date End Date Taking? Authorizing Provider  dexamethasone (DECADRON) 4 MG tablet Take 2 tablets (8 mg total) by mouth 3 (three) times daily. X 2 days; and then 1 pill three times a day. 09/03/15  Yes Cammie Sickle, MD  diphenoxylate-atropine (LOMOTIL) 2.5-0.025 MG tablet Take 1 tablet by mouth 4 (four) times daily as needed for diarrhea or loose stools (take it along with imoidum for diarrhea). 07/26/15  Yes Cammie Sickle, MD  hydrochlorothiazide (HYDRODIURIL) 25 MG tablet Take 25 mg by mouth daily. 03/03/14  Yes Historical Provider, MD  metoprolol succinate (TOPROL-XL) 50 MG 24 hr tablet Take 50 mg by mouth daily.  03/03/14  Yes Historical Provider, MD  osimertinib  mesylate (TAGRISSO) 80 MG tablet Take 1 tablet (80 mg total) by mouth daily. With or without food. 06/25/15  Yes Cammie Sickle, MD  potassium chloride (K-DUR,KLOR-CON) 10 MEQ tablet Take 1 tablet (10 mEq total) by mouth 2 (two) times daily. 05/01/15  Yes Evlyn Kanner, NP  prochlorperazine (COMPAZINE) 10 MG tablet Take 1 tablet (10 mg total) by mouth every 6 (six) hours as needed for nausea or vomiting. 06/25/15  Yes Cammie Sickle, MD      VITAL SIGNS:  Blood pressure 109/86, pulse  75, temperature 98.1 F (36.7 C), temperature source Oral, resp. rate 20, height '5\' 7"'$  (1.702 m), weight 61.236 kg (135 lb), SpO2 100 %.  PHYSICAL EXAMINATION:  GENERAL:  50 y.o.-year-old patient lying in the bed with no acute distress.  EYES: Pupils equal, round, reactive to light and accommodation. No scleral icterus. Extraocular muscles intact.  HEENT: Head atraumatic, normocephalic. Oropharynx and nasopharynx clear.  NECK:  Supple, no jugular venous distention. No thyroid enlargement, no tenderness.  LUNGS: Normal breath sounds bilaterally, no wheezing, rales,rhonchi or crepitation. No use of accessory muscles of respiration.  CARDIOVASCULAR: S1, S2 normal. No murmurs, rubs, or gallops.  ABDOMEN: Soft, nontender, nondistended. Bowel sounds present. No organomegaly or mass.  EXTREMITIES: No pedal edema, cyanosis, or clubbing.  NEUROLOGIC: Cranial nerves II through XII are intact. Muscle strength 4 /5 in left hand. 5 out of 5 muscle strength in right upper extremity and bilateral lower extremities. 5 out of 5 strength in left elbow and shoulder strength. Finger-nose intact. Sensation intact. Gait not checked.  PSYCHIATRIC: The patient is alert and oriented x 3.  SKIN: No rash, lesion, or ulcer.   LABORATORY PANEL:   CBC  Recent Labs Lab 09/10/15 1133  WBC 12.8*  HGB 12.2*  HCT 39.1*  PLT 312   ------------------------------------------------------------------------------------------------------------------  Chemistries   Recent Labs Lab 09/10/15 1133  NA 132*  K 3.5  CL 100*  CO2 26  GLUCOSE 86  BUN 19  CREATININE 0.99  CALCIUM 7.9*  MG 2.1   ------------------------------------------------------------------------------------------------------------------    RADIOLOGY:  Ct Head Wo Contrast  09/10/2015  CLINICAL DATA:  Left hand numbness for 1 week. History of brain and lung cancer. EXAM: CT HEAD WITHOUT CONTRAST TECHNIQUE: Contiguous axial images were obtained  from the base of the skull through the vertex without intravenous contrast. COMPARISON:  MRI of August 23, 2015. FINDINGS: 29 x 17 mm sclerotic density is seen superiorly in left frontal skull which may represent metastatic lesion. Ventricular size is within normal limits. No midline shift is noted. Minimal diffuse cortical atrophy is noted. Mild chronic ischemic white matter disease is noted. Ill-defined low density is again noted in right cerebellar hemisphere consistent with metastatic disease. No definite hemorrhage is noted. Other metastatic lesions seen on prior MRI are not well visualized on this unenhanced study. IMPRESSION: 2.9 cm sclerotic lesions seen in left frontal skull which may represent metastatic lesion. Ill-defined low density is noted in the right cerebellar hemisphere consistent with metastatic lesion and surrounding edema noted on prior MRI. Electronically Signed   By: Marijo Conception, M.D.   On: 09/10/2015 11:50   IMPRESSION AND PLAN:   1. 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 Start IV Decadron and empiric Keppra to prevent seizure. We'll get occupational therapy evaluation and physical therapy evaluation. Consult oncology and radiation oncology 2. Hypertension continue metoprolol but hold hydrochlorothiazide 3. Hypokalemia replace potassium and hold hydrochlorothiazide 4.  Hyponatremia- hold hydrochlorothiazide   All the records are reviewed and case discussed with ED provider. Management plans discussed with the patient, family and they are in agreement.  CODE STATUS: Full code  TOTAL TIME TAKING CARE OF THIS PATIENT: 50 minutes.    Loletha Grayer M.D on 09/10/2015 at 2:02 PM  Between 7am to 6pm - Pager - [phone]  After 6pm call admission pager [facility]  Office  [phone]  CC: Primary care physician; Dr Kriste Basque

## 2015-09-11 ENCOUNTER — Inpatient Hospital Stay: Payer: BLUE CROSS/BLUE SHIELD

## 2015-09-11 ENCOUNTER — Ambulatory Visit
Admit: 2015-09-11 | Discharge: 2015-09-11 | Disposition: A | Discharge status: Home / Self Care | Payer: BLUE CROSS/BLUE SHIELD | Attending: Radiation Oncology | Admitting: Radiation Oncology

## 2015-09-11 ENCOUNTER — Ambulatory Visit: Payer: BLUE CROSS/BLUE SHIELD | Admitting: Radiation Oncology

## 2015-09-11 ENCOUNTER — Other Ambulatory Visit: Payer: Self-pay | Admitting: Internal Medicine

## 2015-09-11 DIAGNOSIS — Z510 Encounter for antineoplastic radiation therapy: Secondary | ICD-10-CM | POA: Diagnosis not present

## 2015-09-11 LAB — CBC
HCT: 38.4 % — ABNORMAL LOW (ref 40.0–52.0)
Hemoglobin: 12.3 g/dL — ABNORMAL LOW (ref 13.0–18.0)
MCH: 23.8 pg — ABNORMAL LOW (ref 26.0–34.0)
MCHC: 32 g/dL (ref 32.0–36.0)
MCV: 74.4 fL — ABNORMAL LOW (ref 80.0–100.0)
PLATELETS: 321 10*3/uL (ref 150–440)
RBC: 5.16 MIL/uL (ref 4.40–5.90)
RDW: 19.2 % — AB (ref 11.5–14.5)
WBC: 10.1 10*3/uL (ref 3.8–10.6)

## 2015-09-11 LAB — BASIC METABOLIC PANEL
Anion gap: 6 (ref 5–15)
BUN: 22 mg/dL — AB (ref 6–20)
CO2: 26 mmol/L (ref 22–32)
CREATININE: 0.98 mg/dL (ref 0.61–1.24)
Calcium: 8.4 mg/dL — ABNORMAL LOW (ref 8.9–10.3)
Chloride: 102 mmol/L (ref 101–111)
Glucose, Bld: 130 mg/dL — ABNORMAL HIGH (ref 65–99)
Potassium: 5.5 mmol/L — ABNORMAL HIGH (ref 3.5–5.1)
SODIUM: 134 mmol/L — AB (ref 135–145)

## 2015-09-11 MED ORDER — ENSURE ENLIVE PO LIQD
237.0000 mL | Freq: Three times a day (TID) | ORAL | Status: DC
  Administered 2015-09-11 – 2015-09-12 (×4): 237 mL via ORAL

## 2015-09-11 MED ORDER — GADOBENATE DIMEGLUMINE 529 MG/ML IV SOLN
15.0000 mL | Freq: Once | INTRAVENOUS | Status: AC | PRN
  Administered 2015-09-11: 10:00:00 12 mL via INTRAVENOUS

## 2015-09-11 NOTE — Progress Notes (Signed)
PT Cancellation Note  Patient Details Name: Luis [name] MRN: [id number] DOB: 11/23/1965   Cancelled Treatment:    Reason Eval/Treat Not Completed: Medical issues which prohibited therapy.  Pt's potassium is noted to be elevated to 5.5 today; due to this will hold PT at this time.  Will re-attempt PT eval at a later date/time.  Mittie Bodo, SPT Mittie Bodo 09/11/2015, 8:50 AM

## 2015-09-11 NOTE — Progress Notes (Signed)
[place] at [facility]                                                                                                                                                                                            Patient Demographics   Luis [name], is a 50 y.o. male, DOB - [birth year], KZL:[id number]  Admit date - 09/10/2015   Admitting Physician Loletha Grayer, MD  Outpatient Primary MD for the patient is PROVIDER NOT IN SYSTEM   LOS - 1  Subjective: Patient continues to complaint of left hand and arm numbness. MRI consistent with multiple metastatic lesions     Review of Systems:   CONSTITUTIONAL: No documented fever. No fatigue, weakness. No weight gain, no weight loss.  EYES: No blurry or double vision.  ENT: No tinnitus. No postnasal drip. No redness of the oropharynx.  RESPIRATORY: No cough, no wheeze, no hemoptysis. No dyspnea.  CARDIOVASCULAR: No chest pain. No orthopnea. No palpitations. No syncope.  GASTROINTESTINAL: No nausea, no vomiting or diarrhea. No abdominal pain. No melena or hematochezia.  GENITOURINARY: No dysuria or hematuria.  ENDOCRINE: No polyuria or nocturia. No heat or cold intolerance.  HEMATOLOGY: No anemia. No bruising. No bleeding.  INTEGUMENTARY: No rashes. No lesions.  MUSCULOSKELETAL: No arthritis. No swelling. No gout.  NEUROLOGIC: Left hand arm numbness and weakness PSYCHIATRIC: No anxiety. No insomnia. No ADD.    Vitals:   Filed Vitals:   09/10/15 1538 09/10/15 2128 09/11/15 0530 09/11/15 1119  BP: 129/73 104/70 100/66 117/78  Pulse: 58 75 64 66  Temp: 98.1 F (36.7 C) 98 F (36.7 C) 97.6 F (36.4 C) 98.6 F (37 C)  TempSrc: Oral Oral Oral Oral  Resp: '20 22 20 18  '$ Height:      Weight:      SpO2: 100% 98% 100% 100%    Wt Readings from Last 3 Encounters:  09/10/15 61.236 kg (135 lb)  08/28/15 60.3 kg (132 lb 15 oz)  08/23/15 61.05 kg (134 lb 9.5 oz)     Intake/Output Summary (Last 24 hours) at  09/11/15 1357 Last data filed at 09/11/15 0900  Gross per 24 hour  Intake    480 ml  Output      0 ml  Net    480 ml    Physical Exam:   GENERAL: Pleasant-appearing in no apparent distress.  HEAD, EYES, EARS, NOSE AND THROAT: Atraumatic, normocephalic. Extraocular muscles are intact. Pupils equal and reactive to light. Sclerae anicteric. No conjunctival injection. No oro-pharyngeal erythema.  NECK: Supple. There is no jugular venous distention. No bruits, no lymphadenopathy, no thyromegaly.  HEART: Regular rate and rhythm,. No murmurs, no rubs, no clicks.  LUNGS: Clear to auscultation bilaterally. No rales or rhonchi. No wheezes.  ABDOMEN: Soft, flat, nontender, nondistended. Has good bowel sounds. No hepatosplenomegaly appreciated.  EXTREMITIES: No evidence of any cyanosis, clubbing, or peripheral edema.  +2 pedal and radial pulses bilaterally.  NEUROLOGIC: The patient is alert, awake, and oriented x3 left arm weakness SKIN: Moist and warm with no rashes appreciated.  Psych: Not anxious, depressed LN: No inguinal LN enlargement    Antibiotics   Anti-infectives    None      Medications   Scheduled Meds: . dexamethasone  4 mg Intravenous 4 times per day  . enoxaparin (LOVENOX) injection  40 mg Subcutaneous Q24H  . levETIRAcetam  500 mg Oral BID  . metoprolol succinate  50 mg Oral Daily  . osimertinib mesylate  80 mg Oral QHS   Continuous Infusions:  PRN Meds:.acetaminophen **OR** acetaminophen, diphenoxylate-atropine, oxyCODONE, prochlorperazine   Data Review:   Micro Results No results found for this or any previous visit (from the past 240 hour(s)).  Radiology Reports Ct Head Wo Contrast  09/10/2015  CLINICAL DATA:  Left hand numbness for 1 week. History of brain and lung cancer. EXAM: CT HEAD WITHOUT CONTRAST TECHNIQUE: Contiguous axial images were obtained from the base of the skull through the vertex without intravenous contrast. COMPARISON:  MRI of August 23, 2015. FINDINGS: 29 x 17 mm sclerotic density is seen superiorly in left frontal skull which may represent metastatic lesion. Ventricular size is within normal limits. No midline shift is noted. Minimal diffuse cortical atrophy is noted. Mild chronic ischemic white matter disease is noted. Ill-defined low density is again noted in right cerebellar hemisphere consistent with metastatic disease. No definite hemorrhage is noted. Other metastatic lesions seen on prior MRI are not well visualized on this unenhanced study. IMPRESSION: 2.9 cm sclerotic lesions seen in left frontal skull which may represent metastatic lesion. Ill-defined low density is noted in the right cerebellar hemisphere consistent with metastatic lesion and surrounding edema noted on prior MRI. Electronically Signed   By: Marijo Conception, M.D.   On: 09/10/2015 11:50   Ct Chest W Contrast  08/20/2015  CLINICAL DATA:  Metastatic adenocarcinoma of the lung, currently on second-line therapy. Restaging. EXAM: CT CHEST WITH CONTRAST TECHNIQUE: Multidetector CT imaging of the chest was performed during intravenous contrast administration. CONTRAST:  74m OMNIPAQUE IOHEXOL 300 MG/ML  SOLN COMPARISON:  07/15/2014 FINDINGS: Mediastinum/Nodes: No current pathologic thoracic adenopathy. Lungs/Pleura: Left upper lobe nodule measures 2.1 by 1.6 cm on image 14 series 3, formerly 3.0 by 2.7 cm. The scattered small pulmonary nodules in both lungs are generally similar in size to prior. Several new indistinct nodules in the left lower lobe on images 41 through 42 of series 3 could be inflammatory but merit observation, and seen new compared to the prior exam. Similarly there is a new 5 mm sub solid nodule in the right lower lobe on image 41 series 3. Upper abdomen: Contracted gallbladder. Left kidney upper pole cyst. Adrenal glands unremarkable. Musculoskeletal: Pattern of extensive osseous metastatic disease including expansile rib lesions and lesions at most  thoracic spine levels, and compression fractures at T2, T3, and T5 with posterior bony retropulsion similar to prior. Prior posterior decompression at T1-2. There is an periostitis or epidural tumor extension at T11 as before. The only significant progression in the osseous metastatic disease is at L1 were the tumor involvement anteriorly measures 2.3 by 1.9  cm, formerly 1.8 by 1.4 cm. The cricoid cartilage appears expanded, possibly with metastatic disease, similar to prior. IMPRESSION: 1. Mixed appearance, with significant reduction in size of the dominant left upper lobe pulmonary nodule, but increase in the apparent size of the L1 vertebral region of involvement compared to the prior exam. Most of the small pulmonary nodules are stable, although there are some new indistinct basilar nodules which could be inflammatory or early neoplastic nodules. Electronically Signed   By: Van Clines M.D.   On: 08/20/2015 13:57   Mr Jeri Cos MV Contrast  09/11/2015  CLINICAL DATA:  50 year old hypertensive male with lung cancer and known metastatic brain lesions. Worsening left upper extremity weakness. Subsequent encounter. EXAM: MRI HEAD WITHOUT AND WITH CONTRAST TECHNIQUE: Multiplanar, multiecho pulse sequences of the brain and surrounding structures were obtained without and with intravenous contrast. CONTRAST:  4m MULTIHANCE GADOBENATE DIMEGLUMINE 529 MG/ML IV SOLN COMPARISON:  09/03/2015 head CT. 08/23/2015, 05/24/2015 in 02/2019 01/30/2014 brain MR. FINDINGS: When compared to examination earlier this month, multiple intracranial metastatic lesions (supratentorial and infratentorial) appear the same or slightly smaller. Largest metastatic lesion posterior right cerebellum measures 2 x 1.6 x 1.6 cm versus prior 2.2 x 1.7 x 1.6 cm. Surrounding vasogenic edema. No new intracranial metastatic lesion detected. Similar appearance of left superior convexity calvarial metastatic lesion with mild bony expansion  spanning over 2.8 x 2.7 cm. No acute infarct. Some of the cerebellum metastatic lesions are partially hemorrhagic (without change) otherwise no intracranial hemorrhage noted. Global atrophy without hydrocephalus. Opacification left mastoid air cells and middle ear cavity. Similar appearance previously. No obstructing lesion of eustachian tube noted. Paranasal sinus mucosal thickening most notable left maxillary sinus with polypoid opacification inferior aspect. No orbital abnormality detected. Shallow sella.  Cervical medullary junction within normal limits. Major intracranial vascular structures are patent. IMPRESSION: When compared to examination earlier this month, multiple intracranial metastatic lesions (supratentorial and infratentorial) appear the same or slightly smaller. Largest metastatic lesion posterior right cerebellum measures 2 x 1.6 x 1.6 cm versus prior 2.2 x 1.7 x 1.6 cm. Surrounding vasogenic edema. No new intracranial metastatic lesion detected. Similar appearance of left superior convexity calvarial metastatic lesion with mild bony expansion spanning over 2.8 x 2.7 cm. No acute infarct. Global atrophy. Opacification left mastoid air cells and middle ear cavity. Similar appearance previously. No obstructing lesion of eustachian tube noted. Paranasal sinus mucosal thickening most notable left maxillary sinus with polypoid opacification inferior aspect. Electronically Signed   By: SGenia DelM.D.   On: 09/11/2015 11:19   Mr BJeri CosWHQContrast  08/23/2015  CLINICAL DATA:  Lung cancer with brain metastases. Follow-up. No current complaints. EXAM: MRI HEAD WITHOUT AND WITH CONTRAST TECHNIQUE: Multiplanar, multiecho pulse sequences of the brain and surrounding structures were obtained without and with intravenous contrast. CONTRAST:  160mMULTIHANCE GADOBENATE DIMEGLUMINE 529 MG/ML IV SOLN COMPARISON:  05/24/2015 FINDINGS: There is no evidence of acute infarct, midline shift, or extra-axial fluid  collection. There is mild generalized cerebral atrophy. A small amount of chronic blood products are again seen in the cerebellum associated with previously treated metastases. The largest lesion, located in the right cerebellum, is unchanged in size and measures 2.2 x 1.7 cm (series 11, image 11). Numerous subcentimeter lesions are again seen throughout both cerebellar hemispheres. Many of these have mildly increased in size (indicated by double arrows on series 11), for example a 5 mm lesion in the lateral right cerebellum (series 11, image 12, previously 1  mm), a 5 mm lesion in the superior right cerebellum (series 11, image 16, previously 1 mm), and a 6 mm lesion in the posteroinferior left cerebellum (series 11, image 8, previously 3 mm). There is at least 1 new cerebellar lesion, measuring 3 mm in the left hemisphere (series 11, image 13). Mild edema in the medial right cerebellum has not significantly changed. New 2 mm medial right parietal lesion versus vessel (series 11, image 31). New 2 mm high right parietal cortical lesion (series 11, image 42). Unchanged 2.5 mm right parietal lesion slightly more medially (series 11, image 43). The 4 mm high posterior right frontal lobe lesion on the prior study is not clearly identified on the current examination. New 5 mm posterior left temporal lesion in close proximity to the tentorium (series 11, image 17). New 3 mm anteromedial left occipital lesion (series 11, image 20). 6 mm left parietal lesion has enlarged (series 11, image 34, previously 4 mm upon remeasurement). New 3 mm lesion near the genu of the left internal capsule (series 11 image 28). Possible additional 1.5 mm lesion more inferiorly in the left internal capsule (series 12, image 17). The 5 mm anterior inferior left frontal lesion on the prior study is no longer clearly identified. However, there is a new 2-3 mm lesion slightly more superiorly in the anterior left frontal lobe (series 12, image 24 and  series 13, image 18). There is no significant edema or mass effect associated with any of the new lesions. 3 cm left parietal skull lesion is unchanged in size and again is without significant enhancement. Orbits are unremarkable. Mild bilateral ethmoid, mild left sphenoid, and moderate left maxillary sinus mucosal thickening and a moderately large left mastoid effusion are noted. Major intracranial vascular flow voids are preserved. IMPRESSION: 1. Increased size and number of small cerebral and cerebellar metastases as above. No significant edema or mass effect. 2. Unchanged size of largest, previously treated right cerebellar metastasis. 3. Two small frontal lobe lesions on the prior study no longer clearly identified. Electronically Signed   By: Logan Bores M.D.   On: 08/23/2015 10:21     CBC  Recent Labs Lab 09/10/15 1133 09/11/15 0436  WBC 12.8* 10.1  HGB 12.2* 12.3*  HCT 39.1* 38.4*  PLT 312 321  MCV 75.3* 74.4*  MCH 23.5* 23.8*  MCHC 31.2* 32.0  RDW 19.2* 19.2*  LYMPHSABS 1.6  --   MONOABS 1.2*  --   EOSABS 0.6  --   BASOSABS 0.0  --     Chemistries   Recent Labs Lab 09/10/15 1133 09/11/15 0436  NA 132* 134*  K 3.5 5.5*  CL 100* 102  CO2 26 26  GLUCOSE 86 130*  BUN 19 22*  CREATININE 0.99 0.98  CALCIUM 7.9* 8.4*  MG 2.1  --    ------------------------------------------------------------------------------------------------------------------ estimated creatinine clearance is 76.3 mL/min (by C-G formula based on Cr of 0.98). ------------------------------------------------------------------------------------------------------------------ No results for input(s): HGBA1C in the last 72 hours. ------------------------------------------------------------------------------------------------------------------ No results for input(s): CHOL, HDL, LDLCALC, TRIG, CHOLHDL, LDLDIRECT in the last 72  hours. ------------------------------------------------------------------------------------------------------------------ No results for input(s): TSH, T4TOTAL, T3FREE, THYROIDAB in the last 72 hours.  Invalid input(s): FREET3 ------------------------------------------------------------------------------------------------------------------ No results for input(s): VITAMINB12, FOLATE, FERRITIN, TIBC, IRON, RETICCTPCT in the last 72 hours.  Coagulation profile No results for input(s): INR, PROTIME in the last 168 hours.  No results for input(s): DDIMER in the last 72 hours.  Cardiac Enzymes No results for input(s): CKMB, TROPONINI, MYOGLOBIN in the  last 168 hours.  Invalid input(s): CK ------------------------------------------------------------------------------------------------------------------ Invalid input(s): [place]   1. 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  Continue IV Decadron, Keppra for seizure prophylaxis Await radiation oncology evaluation 2. Hypertension continue metoprolol and HCTZ on hold  3. Hyperkalemia discontinue potassium supple  4. Hyponatremia- hold hydrochlorothiazide      Code Status Orders        Start     Ordered   09/10/15 1358  Full code   Continuous     09/10/15 1358    Code Status History    Date Active Date Inactive Code Status Order ID Comments User Context   05/29/2015 10:13 AM 05/30/2015  3:29 AM Full Code [id number]  Inez Catalina, MD HOV   03/07/2014  7:04 PM 03/11/2014  6:11 PM Full Code [id number]  Kristeen Miss, MD Inpatient   03/06/2014 10:45 PM 03/07/2014  7:04 PM Full Code [id number]  Shanda Howells, MD Inpatient           Consults  oncology  DVT Prophylaxis  Lovenox  Lab Results  Component Value Date   PLT 321 09/11/2015     Time Spent in minutes   5mn  Greater than 50% of time spent in care coordination and counseling patient regarding the condition and plan  of care.   PDustin FlockM.D on 09/11/2015 at 1:57 PM  Between 7am to 6pm - Pager - [phone]  After 6pm go to [email or web address] - password [email or web address]   Office  [phone]

## 2015-09-11 NOTE — Progress Notes (Signed)
Initial Nutrition Assessment   INTERVENTION:   Meals and Snacks: Cater to patient preferences. Recommend Regular diet order to optimize nutritional intake Medical Food Supplement Therapy: will recommend Ensure Enlive po TID, each supplement provides 350 kcal and 20 grams of protein   NUTRITION DIAGNOSIS:   Inadequate oral intake related to chronic illness as evidenced by per patient/family report.  GOAL:   Patient will meet greater than or equal to 90% of their needs  MONITOR:    (Energy Intake, Anthropometrics, Digestive system, Electrolyte and renal Profile)  REASON FOR ASSESSMENT:   Malnutrition Screening Tool    ASSESSMENT:   Pt admitted wtih left hand weakness. Pt with h/o lung cancer with brain and bone mets.  Past Medical History  Diagnosis Date  . HTN (hypertension)   . Brain lesion   . 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 History of radiation therapy   . Adenocarcinoma of lung ([place]) 12/05/2014    Diet Order:  Diet 2 gram sodium Room service appropriate?: Yes; Fluid consistency:: Thin    Current Nutrition: Pt out of room on visit. No tray at bedside however note pt with Ensure Complete from community at bedside.   Food/Nutrition-Related History: Per MST decreased appetite PTA. Recorded po intake 100% of meals.   Scheduled Medications:  . dexamethasone  4 mg Intravenous 4 times per day  . enoxaparin (LOVENOX) injection  40 mg Subcutaneous Q24H  . levETIRAcetam  500 mg Oral BID  . metoprolol succinate  50 mg Oral Daily  . osimertinib mesylate  80 mg Oral QHS     Electrolyte/Renal Profile and Glucose Profile:   Recent Labs Lab 09/10/15 1133 09/11/15 0436  NA 132* 134*  K 3.5 5.5*  CL 100* 102  CO2 26 26  BUN 19 22*  CREATININE 0.99 0.98  CALCIUM 7.9* 8.4*  MG 2.1  --   PHOS 3.7  --   GLUCOSE 86 130*   Protein Profile: No results for input(s): ALBUMIN in the last 168 hours.  Gastrointestinal Profile: Last BM:  09/11/2015   Nutrition-Focused  Physical Exam Findings:  Unable to complete Nutrition-Focused physical exam at this time.    Weight Change: Per MST decrease in weight PTA. Per CHL weight relatively stable.   Height:   Ht Readings from Last 1 Encounters:  09/10/15 '5\' 7"'$  (1.702 m)    Weight:   Wt Readings from Last 1 Encounters:  09/10/15 135 lb (61.236 kg)   Wt Readings from Last 10 Encounters:  09/10/15 135 lb (61.236 kg)  08/28/15 132 lb 15 oz (60.3 kg)  08/23/15 134 lb 9.5 oz (61.05 kg)  07/26/15 139 lb 1.8 oz (63.1 kg)  07/02/15 132 lb 4.4 oz (60 kg)  06/25/15 136 lb 7.4 oz (61.9 kg)  05/29/15 140 lb (63.504 kg)  05/28/15 135 lb (61.236 kg)  05/24/15 140 lb 10.5 oz (63.8 kg)  05/24/15 135 lb (61.236 kg)    BMI:  Body mass index is 21.14 kg/(m^2).  Estimated Nutritional Needs:   Kcal:  BEE: 1414kcals, TEE: (IF 1.1-1.3)(AF 1.2) 1865-2205kcals  Protein:  61-73g protein (1.0-1.2g/kg)  Fluid:  1525-1854m of fluid (25-355mkg)  EDUCATION NEEDS:   No education needs identified at this time   HIChurchvilleRD, LDN Pager ([phone]/On-Call Pager ([phone]

## 2015-09-11 NOTE — Progress Notes (Signed)
Radiation Oncology Follow up Note  Name: Luis [name]   Date:   09/10/2015 MRN:  [id number] DOB: 06-03-1966    This 50 y.o. 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  REFERRING PROVIDER: No ref. provider found  HPI: Patient is a 50 year old male well-known to department haven't previously been treated to T1-T5 inclusive as well as his whole brain for stage IV lung cancer. I recently saw him in consultation about 2 weeks prior was noted that he had increasing size and number of small cerebral and cerebellar lesions consistent with progressive metastatic disease. He had recently been started on .Tagrisso which has some activity through the blood-brain barrier. He was recently admitted through emergency room with increasing left upper extremity weakness which is subtle. No other complaints at this time. MRI scan confirms progressive number of lesions in size in the brain. I been asked to evaluate the patient for possible further palliative radiation therapy at this time. He does have known history of bone metastasis with prior treatment to his thoracic spine I've asked medical oncology to repeat an MRI scan of his thoracic spine to rule out possibility some of his decreased left upper extremity weakness could be spinal in nature.  COMPLICATIONS OF TREATMENT: none  FOLLOW UP COMPLIANCE: keeps appointments   PHYSICAL EXAM:  BP 117/78 mmHg  Pulse 66  Temp(Src) 98.6 F (37 C) (Oral)  Resp 18  Ht '5\' 7"'$  (1.702 m)  Wt 135 lb (61.236 kg)  BMI 21.14 kg/m2  SpO2 100% Cranial nerves II through XII are grossly intact. Motor and sensory levels are equal and symmetric in the upper extremities except for his left upper extremity with her some subtle minor decreased strength noted. Well-developed  well-nourished patient in NAD. HEENT reveals PERLA, EOMI, discs not visualized.  Oral cavity is clear. No oral mucosal lesions are identified. Neck is clear without evidence of cervical or supraclavicular adenopathy. Lungs are clear to A&P. Cardiac examination is essentially unremarkable with regular rate and rhythm without murmur rub or thrill. Abdomen is benign with no organomegaly or masses noted. Motor sensory and DTR levels are equal and symmetric in the upper and lower extremities. Cranial nerves II through XII are grossly intact. Proprioception is intact. No peripheral adenopathy or edema is identified. No motor or sensory levels are noted. Crude visual fields are within normal range.  RADIOLOGY RESULTS: CT scan and MRI scan of the brain reviewed MRI scan of thoracic spine is been ordered  PLAN: At this time based on progressive disease in his brain I like to treat with salvage course of radiation therapy 3000 cGy in 15 fractions. Again risks and benefits of treatment including hair loss fatigue cognitive decline alteration of blood counts all were discussed with the patient in detail. He seems to comprehend my treatment plan well. I have started and ordered CT simulation on emergent basis and will start treatments as soon as possible.  I would like to take this opportunity for allowing me to participate in the care of your patient.Armstead Peaks., MD

## 2015-09-11 NOTE — Progress Notes (Signed)
Pt transported to cancer center for radiation.  Clarise Cruz, RN

## 2015-09-11 NOTE — Progress Notes (Signed)
OT Cancellation Note  Patient Details Name: Luis [name] MRN: [id number] DOB: [birth year]   Cancelled Treatment:    Reason Eval/Treat Not Completed: Medical issues which prohibited therapy. Pt's potassium is noted to be elevated to 5.5 today; due to this will hold OT at this time.  Sharon Mt 09/11/2015, 9:45 AM

## 2015-09-12 ENCOUNTER — Inpatient Hospital Stay: Payer: BLUE CROSS/BLUE SHIELD

## 2015-09-12 DIAGNOSIS — R200 Anesthesia of skin: Secondary | ICD-10-CM

## 2015-09-12 DIAGNOSIS — K30 Functional dyspepsia: Secondary | ICD-10-CM

## 2015-09-12 LAB — BASIC METABOLIC PANEL
Anion gap: 6 (ref 5–15)
BUN: 22 mg/dL — AB (ref 6–20)
CHLORIDE: 104 mmol/L (ref 101–111)
CO2: 26 mmol/L (ref 22–32)
CREATININE: 0.88 mg/dL (ref 0.61–1.24)
Calcium: 8.4 mg/dL — ABNORMAL LOW (ref 8.9–10.3)
GFR calc Af Amer: >60 mL/min (ref >60)
GFR calc non Af Amer: >60 mL/min (ref >60)
GLUCOSE: 133 mg/dL — AB (ref 65–99)
POTASSIUM: 4.4 mmol/L (ref 3.5–5.1)
Sodium: 136 mmol/L (ref 135–145)

## 2015-09-12 MED ORDER — PANTOPRAZOLE SODIUM 40 MG PO TBEC: 40.0000 mg | DELAYED_RELEASE_TABLET | Freq: Two times a day (BID) | ORAL | Status: DC

## 2015-09-12 MED ORDER — GADOBENATE DIMEGLUMINE 529 MG/ML IV SOLN
15.0000 mL | Freq: Once | INTRAVENOUS | Status: AC | PRN
  Administered 2015-09-12: 10:00:00 12 mL via INTRAVENOUS

## 2015-09-12 MED ORDER — PANTOPRAZOLE SODIUM 40 MG PO TBEC
40.0000 mg | DELAYED_RELEASE_TABLET | Freq: Two times a day (BID) | ORAL | Status: DC
  Administered 2015-09-12: 09:00:00 40 mg via ORAL
  Filled 2015-09-12: qty 1

## 2015-09-12 MED ORDER — LEVETIRACETAM 500 MG PO TABS: 500.0000 mg | ORAL_TABLET | Freq: Two times a day (BID) | ORAL | Status: DC

## 2015-09-12 MED ORDER — DEXAMETHASONE 4 MG PO TABS: 4.0000 mg | ORAL_TABLET | Freq: Three times a day (TID) | ORAL | Status: DC

## 2015-09-12 MED ORDER — OXYCODONE HCL 5 MG PO TABS: 5.0000 mg | ORAL_TABLET | ORAL | Status: DC | PRN

## 2015-09-12 NOTE — Discharge Instructions (Signed)
°  DIET:  Cardiac diet  DISCHARGE CONDITION:  Stable  ACTIVITY:  Activity as tolerated  OXYGEN:  Home Oxygen: No.   Oxygen Delivery: room air  DISCHARGE LOCATION:  home    ADDITIONAL DISCHARGE INSTRUCTION:out patient occupational therapy   If you experience worsening of your admission symptoms, develop shortness of breath, life threatening emergency, suicidal or homicidal thoughts you must seek medical attention immediately by calling 911 or calling your MD immediately  if symptoms less severe.  You Must read complete instructions/literature along with all the possible adverse reactions/side effects for all the Medicines you take and that have been prescribed to you. Take any new Medicines after you have completely understood and accpet all the possible adverse reactions/side effects.   Please note  You were cared for by a hospitalist during your hospital stay. If you have any questions about your discharge medications or the care you received while you were in the hospital after you are discharged, you can call the unit and asked to speak with the hospitalist on call if the hospitalist that took care of you is not available. Once you are discharged, your primary care physician will handle any further medical issues. Please note that NO REFILLS for any discharge medications will be authorized once you are discharged, as it is imperative that you return to your primary care physician (or establish a relationship with a primary care physician if you do not have one) for your aftercare needs so that they can reassess your need for medications and monitor your lab values.

## 2015-09-12 NOTE — Progress Notes (Signed)
Discussed discharge instructions and medications with pt.  Pt's IV removed. Pt asked about pain med to go home.  Dr. Posey Pronto on floor and asked.  Rx written, however pt when returned to pt's room, he had left already.  Called pt and he declined to come back for Rx. Pt transported home via car by wife.  Clarise Cruz, RN

## 2015-09-12 NOTE — Discharge Summary (Signed)
Luis [name], 50 y.o., DOB [birth year], MRN [id number]. Admission date: 09/10/2015 Discharge Date 09/12/2015 Primary MD PROVIDER NOT IN SYSTEM Admitting Physician Luis Grayer, MD  Admission Diagnosis  Hypocalcemia [E83.51] Brain metastases (HCC) [C79.31] Numbness and tingling in left hand [R20.2]  Discharge Diagnosis   Active Problems:   Left hand weakness Brain metastases Cervical spine metastases Lung cancer Hypertension       Hospital Course  Luis [name] is a 50 y.o. 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 He presents with weakness of his left hand going on for about 1 week. He was recently seen by oncology and put on steroids. Patient came to the ER for further evaluation. Patient in the ER had MRI of the brain which showed increase in size of the lesions in the brain. He was admitted for further evaluation and treatment. Patient was started on IV Decadron. And he was seen by his oncologist. He underwent a C-spine MRI which also confirmed lesion in the C-spine.Dr. Burlene [name] discussed the case with radiation oncologist. Patient was mapped for brain radiation he will also discussed the C-spine lesion with the radiation oncology and they will start him on radiation. Patient was also recommended to have physical therapy as outpatient which will need to be evaluated by his oncologist as per case manager.            Consults  hematology/oncology  Significant Tests:  See full reports for all details    Ct Head Wo Contrast  09/10/2015  CLINICAL DATA:  Left hand numbness for 1 week. History of brain and lung cancer. EXAM: CT HEAD WITHOUT CONTRAST TECHNIQUE: Contiguous axial images were obtained from the base of the skull through the vertex without intravenous contrast. COMPARISON:  MRI of August 23, 2015. FINDINGS: 29 x 17 mm sclerotic density is seen superiorly in left frontal skull which may represent metastatic lesion. Ventricular size is within normal  limits. No midline shift is noted. Minimal diffuse cortical atrophy is noted. Mild chronic ischemic white matter disease is noted. Ill-defined low density is again noted in right cerebellar hemisphere consistent with metastatic disease. No definite hemorrhage is noted. Other metastatic lesions seen on prior MRI are not well visualized on this unenhanced study. IMPRESSION: 2.9 cm sclerotic lesions seen in left frontal skull which may represent metastatic lesion. Ill-defined low density is noted in the right cerebellar hemisphere consistent with metastatic lesion and surrounding edema noted on prior MRI. Electronically Signed   By: Luis [name], M.D.   On: 09/10/2015 11:50   Ct Chest W Contrast  08/20/2015  CLINICAL DATA:  Metastatic adenocarcinoma of the lung, currently on second-line therapy. Restaging. EXAM: CT CHEST WITH CONTRAST TECHNIQUE: Multidetector CT imaging of the chest was performed during intravenous contrast administration. CONTRAST:  31m OMNIPAQUE IOHEXOL 300 MG/ML  SOLN COMPARISON:  07/15/2014 FINDINGS: Mediastinum/Nodes: No current pathologic thoracic adenopathy. Lungs/Pleura: Left upper lobe nodule measures 2.1 by 1.6 cm on image 14 series 3, formerly 3.0 by 2.7 cm. The scattered small pulmonary nodules in both lungs are generally similar in size to prior. Several new indistinct nodules in the left lower lobe on images 41 through 42 of series 3 could be inflammatory but merit observation, and seen new compared to the prior exam. Similarly there is a new 5 mm sub solid nodule in the right lower lobe on image 41 series 3. Upper abdomen: Contracted gallbladder. Left kidney upper pole cyst. Adrenal glands unremarkable. Musculoskeletal: Pattern of extensive osseous  metastatic disease including expansile rib lesions and lesions at most thoracic spine levels, and compression fractures at T2, T3, and T5 with posterior bony retropulsion similar to prior. Prior posterior decompression at T1-2. There is  an periostitis or epidural tumor extension at T11 as before. The only significant progression in the osseous metastatic disease is at L1 were the tumor involvement anteriorly measures 2.3 by 1.9 cm, formerly 1.8 by 1.4 cm. The cricoid cartilage appears expanded, possibly with metastatic disease, similar to prior. IMPRESSION: 1. Mixed appearance, with significant reduction in size of the dominant left upper lobe pulmonary nodule, but increase in the apparent size of the L1 vertebral region of involvement compared to the prior exam. Most of the small pulmonary nodules are stable, although there are some new indistinct basilar nodules which could be inflammatory or early neoplastic nodules. Electronically Signed   By: Luis [name] M.D.   On: 08/20/2015 13:57   Mr Luis [name] UM Contrast  09/11/2015  CLINICAL DATA:  50 year old hypertensive male with lung cancer and known metastatic brain lesions. Worsening left upper extremity weakness. Subsequent encounter. EXAM: MRI HEAD WITHOUT AND WITH CONTRAST TECHNIQUE: Multiplanar, multiecho pulse sequences of the brain and surrounding structures were obtained without and with intravenous contrast. CONTRAST:  70m MULTIHANCE GADOBENATE DIMEGLUMINE 529 MG/ML IV SOLN COMPARISON:  09/03/2015 head CT. 08/23/2015, 05/24/2015 in 02/2019 01/30/2014 brain MR. FINDINGS: When compared to examination earlier this month, multiple intracranial metastatic lesions (supratentorial and infratentorial) appear the same or slightly smaller. Largest metastatic lesion posterior right cerebellum measures 2 x 1.6 x 1.6 cm versus prior 2.2 x 1.7 x 1.6 cm. Surrounding vasogenic edema. No new intracranial metastatic lesion detected. Similar appearance of left superior convexity calvarial metastatic lesion with mild bony expansion spanning over 2.8 x 2.7 cm. No acute infarct. Some of the cerebellum metastatic lesions are partially hemorrhagic (without change) otherwise no intracranial hemorrhage  noted. Global atrophy without hydrocephalus. Opacification left mastoid air cells and middle ear cavity. Similar appearance previously. No obstructing lesion of eustachian tube noted. Paranasal sinus mucosal thickening most notable left maxillary sinus with polypoid opacification inferior aspect. No orbital abnormality detected. Shallow sella.  Cervical medullary junction within normal limits. Major intracranial vascular structures are patent. IMPRESSION: When compared to examination earlier this month, multiple intracranial metastatic lesions (supratentorial and infratentorial) appear the same or slightly smaller. Largest metastatic lesion posterior right cerebellum measures 2 x 1.6 x 1.6 cm versus prior 2.2 x 1.7 x 1.6 cm. Surrounding vasogenic edema. No new intracranial metastatic lesion detected. Similar appearance of left superior convexity calvarial metastatic lesion with mild bony expansion spanning over 2.8 x 2.7 cm. No acute infarct. Global atrophy. Opacification left mastoid air cells and middle ear cavity. Similar appearance previously. No obstructing lesion of eustachian tube noted. Paranasal sinus mucosal thickening most notable left maxillary sinus with polypoid opacification inferior aspect. Electronically Signed   By: SGenia DelM.D.   On: 09/11/2015 11:19   Mr BJeri CosWPNContrast  08/23/2015  CLINICAL DATA:  Lung cancer with brain metastases. Follow-up. No current complaints. EXAM: MRI HEAD WITHOUT AND WITH CONTRAST TECHNIQUE: Multiplanar, multiecho pulse sequences of the brain and surrounding structures were obtained without and with intravenous contrast. CONTRAST:  173mMULTIHANCE GADOBENATE DIMEGLUMINE 529 MG/ML IV SOLN COMPARISON:  05/24/2015 FINDINGS: There is no evidence of acute infarct, midline shift, or extra-axial fluid collection. There is mild generalized cerebral atrophy. A small amount of chronic blood products are again seen in the cerebellum associated with previously  treated  metastases. The largest lesion, located in the right cerebellum, is unchanged in size and measures 2.2 x 1.7 cm (series 11, image 11). Numerous subcentimeter lesions are again seen throughout both cerebellar hemispheres. Many of these have mildly increased in size (indicated by double arrows on series 11), for example a 5 mm lesion in the lateral right cerebellum (series 11, image 12, previously 1 mm), a 5 mm lesion in the superior right cerebellum (series 11, image 16, previously 1 mm), and a 6 mm lesion in the posteroinferior left cerebellum (series 11, image 8, previously 3 mm). There is at least 1 new cerebellar lesion, measuring 3 mm in the left hemisphere (series 11, image 13). Mild edema in the medial right cerebellum has not significantly changed. New 2 mm medial right parietal lesion versus vessel (series 11, image 31). New 2 mm high right parietal cortical lesion (series 11, image 42). Unchanged 2.5 mm right parietal lesion slightly more medially (series 11, image 43). The 4 mm high posterior right frontal lobe lesion on the prior study is not clearly identified on the current examination. New 5 mm posterior left temporal lesion in close proximity to the tentorium (series 11, image 17). New 3 mm anteromedial left occipital lesion (series 11, image 20). 6 mm left parietal lesion has enlarged (series 11, image 34, previously 4 mm upon remeasurement). New 3 mm lesion near the genu of the left internal capsule (series 11 image 28). Possible additional 1.5 mm lesion more inferiorly in the left internal capsule (series 12, image 17). The 5 mm anterior inferior left frontal lesion on the prior study is no longer clearly identified. However, there is a new 2-3 mm lesion slightly more superiorly in the anterior left frontal lobe (series 12, image 24 and series 13, image 18). There is no significant edema or mass effect associated with any of the new lesions. 3 cm left parietal skull lesion is unchanged in size and  again is without significant enhancement. Orbits are unremarkable. Mild bilateral ethmoid, mild left sphenoid, and moderate left maxillary sinus mucosal thickening and a moderately large left mastoid effusion are noted. Major intracranial vascular flow voids are preserved. IMPRESSION: 1. Increased size and number of small cerebral and cerebellar metastases as above. No significant edema or mass effect. 2. Unchanged size of largest, previously treated right cerebellar metastasis. 3. Two small frontal lobe lesions on the prior study no longer clearly identified. Electronically Signed   By: Logan Bores M.D.   On: 08/23/2015 10:21   Mr Thoracic Spine W Wo Contrast  09/12/2015  CLINICAL DATA:  Weakness of the left upper extremity.  Lung cancer EXAM: MRI THORACIC SPINE WITHOUT AND WITH CONTRAST TECHNIQUE: Multiplanar and multiecho pulse sequences of the thoracic spine were obtained without and with intravenous contrast. CONTRAST:  28m MULTIHANCE GADOBENATE DIMEGLUMINE 529 MG/ML IV SOLN COMPARISON:  03/07/2014 FINDINGS: Sagittal counting sequence of the cervical spine shows a C7 metastasis which involves the entire visualized vertebral and infiltrates the left C7-T1 and C6-7 foramina. Patient is undergone T1-T5 radiotherapy of previously bulky metastatic disease. There has also been a T1 decompressive laminectomy. Spinal cord compression has resolved with no residual signal abnormality. Tumor at this level is nonenhancing compared to the other levels. Aside from the T8 and T9 levels there is metastatic disease within all of the thoracic vertebrae. There are chronic compression fractures at T2 and T5 with height loss greater than 50%. Height loss has progressed since 2015 comparison. Early ventral epidural tumor  growth at T11, without cord compromise. Chronic T1-2 and T2-3 left foraminal stenosis from metastatic expansion and distortion of the posterior elements. IMPRESSION: 1. Large C7 metastasis with left C6-7 and  C7-T1 foraminal infiltration that could certainly explain the left upper extremity symptoms. Recommend dedicated cervical spine imaging. 2. T1 through T5 radiotherapy with good response compared to 2015. There is no residual cord compression and the metastases are nonenhancing. 3. Notable T11 metastasis with early ventral epidural infiltration. Electronically Signed   By: Monte Fantasia M.D.   On: 09/12/2015 10:35       Today   Subjective:   Luis [name]  still has left hand weakness.  Objective:   Blood pressure 106/67, pulse 67, temperature 97.6 F (36.4 C), temperature source Oral, resp. rate 20, height '5\' 7"'$  (1.702 m), weight 61.236 kg (135 lb), SpO2 99 %.  . No intake or output data in the 24 hours ending 09/12/15 1625  Exam VITAL SIGNS: Blood pressure 106/67, pulse 67, temperature 97.6 F (36.4 C), temperature source Oral, resp. rate 20, height '5\' 7"'$  (1.702 m), weight 61.236 kg (135 lb), SpO2 99 %.  GENERAL:  50 y.o.-year-old patient lying in the bed with no acute distress.  EYES: Pupils equal, round, reactive to light and accommodation. No scleral icterus. Extraocular muscles intact.  HEENT: Head atraumatic, normocephalic. Oropharynx and nasopharynx clear.  NECK:  Supple, no jugular venous distention. No thyroid enlargement, no tenderness.  LUNGS: Normal breath sounds bilaterally, no wheezing, rales,rhonchi or crepitation. No use of accessory muscles of respiration.  CARDIOVASCULAR: S1, S2 normal. No murmurs, rubs, or gallops.  ABDOMEN: Soft, nontender, nondistended. Bowel sounds present. No organomegaly or mass.  EXTREMITIES: No pedal edema, cyanosis, or clubbing.  NEUROLOGIC: Cranial nerves II through XII are intact.Diminished strength in the left upper extremity Psychiatric C: The patient is alert and oriented x 3.  SKIN: No obvious rash, lesion, or ulcer.   Data Review     CBC w Diff:  Lab Results  Component Value Date   WBC 10.1 09/11/2015   WBC 6.9 11/08/2014   HGB  12.3* 09/11/2015   HGB 14.4 11/08/2014   HCT 38.4* 09/11/2015   HCT 44.8 11/08/2014   PLT 321 09/11/2015   PLT 242 11/08/2014   LYMPHOPCT 13 09/10/2015   LYMPHOPCT 28.3 11/08/2014   MONOPCT 9 09/10/2015   MONOPCT 10.6 11/08/2014   EOSPCT 5 09/10/2015   EOSPCT 2.9 11/08/2014   BASOPCT 0 09/10/2015   BASOPCT 0.6 11/08/2014   CMP:  Lab Results  Component Value Date   NA 136 09/12/2015   NA 141 11/08/2014   K 4.4 09/12/2015   K 3.7 11/08/2014   CL 104 09/12/2015   CL 106 11/08/2014   CO2 26 09/12/2015   CO2 29 11/08/2014   BUN 22* 09/12/2015   BUN 13 11/08/2014   CREATININE 0.88 09/12/2015   CREATININE 1.15 11/08/2014   PROT 7.7 08/23/2015   PROT 7.7 11/08/2014   ALBUMIN 3.1* 08/23/2015   ALBUMIN 4.0 11/08/2014   BILITOT 0.4 08/23/2015   BILITOT 0.6 11/08/2014   ALKPHOS 137* 08/23/2015   ALKPHOS 62 11/08/2014   AST 33 08/23/2015   AST 31 11/08/2014   ALT 52 08/23/2015   ALT 53 11/08/2014  .  Micro Results No results found for this or any previous visit (from the past 240 hour(s)).      Code Status Orders        Start     Ordered   09/10/15 1358  Full code   Continuous     09/10/15 1358    Code Status History    Date Active Date Inactive Code Status Order ID Comments User Context   05/29/2015 10:13 AM 05/30/2015  3:29 AM Full Code [id number]  Inez Catalina, MD Portal   03/07/2014  7:04 PM 03/11/2014  6:11 PM Full Code [id number]  Kristeen Miss, MD Inpatient   03/06/2014 10:45 PM 03/07/2014  7:04 PM Full Code [id number]  Shanda Howells, MD Inpatient          Follow-up Information    Follow up with Cammie Sickle, MD. Go on 09/19/2015.   Specialties:  Internal Medicine, Oncology   Why:  at 11:45 a.m.   Contact information:   [place] [address] [phone]       Discharge Medications     Medication List    TAKE these medications        dexamethasone 4 MG tablet  Commonly known as:  DECADRON  Take 1 tablet (4 mg total) by  mouth every 8 (eight) hours. X 2 days; and then 1 pill three times a day.     diphenoxylate-atropine 2.5-0.025 MG tablet  Commonly known as:  LOMOTIL  Take 1 tablet by mouth 4 (four) times daily as needed for diarrhea or loose stools (take it along with imoidum for diarrhea).     hydrochlorothiazide 25 MG tablet  Commonly known as:  HYDRODIURIL  Take 25 mg by mouth daily.     levETIRAcetam 500 MG tablet  Commonly known as:  KEPPRA  Take 1 tablet (500 mg total) by mouth 2 (two) times daily.     metoprolol succinate 50 MG 24 hr tablet  Commonly known as:  TOPROL-XL  Take 50 mg by mouth daily.     osimertinib mesylate 80 MG tablet  Commonly known as:  TAGRISSO  Take 1 tablet (80 mg total) by mouth daily. With or without food.     pantoprazole 40 MG tablet  Commonly known as:  PROTONIX  Take 1 tablet (40 mg total) by mouth 2 (two) times daily.     potassium chloride 10 MEQ tablet  Commonly known as:  K-DUR,KLOR-CON  Take 1 tablet (10 mEq total) by mouth 2 (two) times daily.     prochlorperazine 10 MG tablet  Commonly known as:  COMPAZINE  Take 1 tablet (10 mg total) by mouth every 6 (six) hours as needed for nausea or vomiting.           Total Time in preparing paper work, data evaluation and todays exam - 35 minutes  Dustin Flock M.D on 09/12/2015 at 4:25 PM  [facility]   Office  [phone]

## 2015-09-12 NOTE — Evaluation (Signed)
Physical Therapy Evaluation Patient Details Name: Luis [name] MRN: [id number] DOB: Jun 08, 1966 Today's Date: 09/12/2015   History of Present Illness  Pt is a 50 y.o. male with PMH of metastatic cancer (lung, spine and brain) and radiation therapy.  Pt presented with L hand weakness and numbness.    Clinical Impression  Pt was independent with bed mobility and sit to stand.  Pt was supervision during ambulation for 380 feet with a narrow BOS.  Pt had slight decreased balance on L compared to R, specifically in tandem stance and single leg stance.  However, there was no loss of balance during functional mobility, appears close to baseline in terms of functional mobility.  It is recommended that pt does not need PT at this time. Will complete PT order and will discharge pt from PT in house.    Follow Up Recommendations No PT follow up    Equipment Recommendations  None recommended by PT    Recommendations for Other Services       Precautions / Restrictions Precautions Precautions: None Restrictions Weight Bearing Restrictions: No      Mobility  Bed Mobility Overal bed mobility: Independent                Transfers Overall transfer level: Independent Equipment used: None                Ambulation/Gait Ambulation/Gait assistance: Supervision Ambulation Distance (Feet): 380 Feet Assistive device: None Gait Pattern/deviations: Narrow base of support        Stairs            Wheelchair Mobility    Modified Rankin (Stroke Patients Only)       Balance Overall balance assessment: Independent    All standing:  Feet together: WNL Feet together eyes closed: WNL Feet Together Dynamic Reaching:  WNL Tandem stance:  R: WNL; L: decreased compared to R, self corrective strategy employed. Single leg stance:  R: WNL (10 seconds); L: decreased compared to R, lost balance at 6 second mark, self corrective strategy not able to be initiated.  PT assisted in balance  (Min assist).                                      Pertinent Vitals/Pain Pain Assessment: No/denies pain  See flow sheet for vitals.     Home Living Family/patient expects to be discharged to:: Private residence Living Arrangements: Spouse/significant other;Children Available Help at Discharge: Family   Home Access: Level entry     Home Layout: One level Ladd: None      Prior Function Level of Independence: Independent               Hand Dominance   Dominant Hand: Right    Extremity/Trunk Assessment   Upper Extremity Assessment: LUE deficits/detail  Impaired C6-C8 dermatomes on L UE.  All other L dermatomes were WNL. R UE dermatomes were WNL.     LUE Deficits / Details: Decreased grip strength on L hand compared to R.  Both UE were at least a 3/5 on shoulder flexors, shoulder abductors, elbow flexors, and elbow extensors   Lower Extremity Assessment: Overall WFL for tasks assessed  L and R LE sensation WNL   At least a 3/5 Bilateral LE:  hip flexors,quadriceps, hamstrings, hip abductors, hip adductors, dorsiflexors, plantarflexors.    Cervical / Trunk Assessment: Normal  Communication   Communication: No  difficulties  Cognition Arousal/Alertness: Awake/alert Behavior During Therapy: WFL for tasks assessed/performed Overall Cognitive Status: Within Functional Limits for tasks assessed                      General Comments   Nursing was contacted and cleared pt for physical therapy.  Pt was agreeable and tolerated session well.     Exercises        Assessment/Plan    PT Assessment Patent does not need any further PT services  PT Diagnosis     PT Problem List    PT Treatment Interventions     PT Goals (Current goals can be found in the Care Plan section)      Frequency     Barriers to discharge        Co-evaluation               End of Session Equipment Utilized During Treatment: Gait  belt Activity Tolerance: Patient tolerated treatment well Patient left: in bed (MD present at the end of session)           Time: 9735-3299 PT Time Calculation (min) (ACUTE ONLY): 20 min   Charges:         PT G Codes:       Mittie Bodo, SPT Mittie Bodo 09/12/2015, 11:38 AM

## 2015-09-12 NOTE — Progress Notes (Signed)
Luis [name]   DOB:[birth year]   [id number]    Subjective: Patient continues to complain of mild weakness in his left upper extremity/accompanied by numbness. Denies any headaches. No nausea no vomiting. Denies any abdominal discomfort.  ROS: no diarrhea or constipation. No fevers.   Objective:  Filed Vitals:   09/11/15 2313 09/12/15 0542  BP: 122/71 100/65  Pulse: 59 60  Temp:    Resp:  18     Intake/Output Summary (Last 24 hours) at 09/12/15 0809 Last data filed at 09/11/15 1300  Gross per 24 hour  Intake    480 ml  Output      0 ml  Net    480 ml    GENERAL: Well-nourished well-developed; Alert, no distress and comfortable.He is alone; sitting in bed having breakfast. EYES: no pallor or icterus OROPHARYNX: no thrush or ulceration; good dentition  NECK: supple, no masses felt LYMPH: no palpable lymphadenopathy in the cervical, axillary or inguinal regions LUNGS: clear to auscultation and No wheeze or crackles HEART/CVS: regular rate & rhythm and no murmurs; No lower extremity edema ABDOMEN:abdomen soft, non-tender and normal bowel sounds Musculoskeletal:no cyanosis of digits and no clubbing  PSYCH: alert & oriented x 3; slow speech. NEURO: no focal motor/sensory deficits; subtle weakness in left hand grip.  SKIN: no rashes or significant lesions   Labs:  Lab Results  Component Value Date   WBC 10.1 09/11/2015   HGB 12.3* 09/11/2015   HCT 38.4* 09/11/2015   MCV 74.4* 09/11/2015   PLT 321 09/11/2015   NEUTROABS 9.3* 09/10/2015    Lab Results  Component Value Date   NA 136 09/12/2015   K 4.4 09/12/2015   CL 104 09/12/2015   CO2 26 09/12/2015    Studies:  Ct Head Wo Contrast  09/10/2015  CLINICAL DATA:  Left hand numbness for 1 week. History of brain and lung cancer. EXAM: CT HEAD WITHOUT CONTRAST TECHNIQUE: Contiguous axial images were obtained from the base of the skull through the vertex without intravenous contrast. COMPARISON:  MRI of August 23, 2015.  FINDINGS: 29 x 17 mm sclerotic density is seen superiorly in left frontal skull which may represent metastatic lesion. Ventricular size is within normal limits. No midline shift is noted. Minimal diffuse cortical atrophy is noted. Mild chronic ischemic white matter disease is noted. Ill-defined low density is again noted in right cerebellar hemisphere consistent with metastatic disease. No definite hemorrhage is noted. Other metastatic lesions seen on prior MRI are not well visualized on this unenhanced study. IMPRESSION: 2.9 cm sclerotic lesions seen in left frontal skull which may represent metastatic lesion. Ill-defined low density is noted in the right cerebellar hemisphere consistent with metastatic lesion and surrounding edema noted on prior MRI. Electronically Signed   By: Marijo Conception, M.D.   On: 09/10/2015 11:50   Mr Jeri Cos WU Contrast  09/11/2015  CLINICAL DATA:  50 year old hypertensive male with lung cancer and known metastatic brain lesions. Worsening left upper extremity weakness. Subsequent encounter. EXAM: MRI HEAD WITHOUT AND WITH CONTRAST TECHNIQUE: Multiplanar, multiecho pulse sequences of the brain and surrounding structures were obtained without and with intravenous contrast. CONTRAST:  53m MULTIHANCE GADOBENATE DIMEGLUMINE 529 MG/ML IV SOLN COMPARISON:  09/03/2015 head CT. 08/23/2015, 05/24/2015 in 02/2019 01/30/2014 brain MR. FINDINGS: When compared to examination earlier this month, multiple intracranial metastatic lesions (supratentorial and infratentorial) appear the same or slightly smaller. Largest metastatic lesion posterior right cerebellum measures 2 x 1.6 x 1.6 cm versus prior 2.2 x  1.7 x 1.6 cm. Surrounding vasogenic edema. No new intracranial metastatic lesion detected. Similar appearance of left superior convexity calvarial metastatic lesion with mild bony expansion spanning over 2.8 x 2.7 cm. No acute infarct. Some of the cerebellum metastatic lesions are partially  hemorrhagic (without change) otherwise no intracranial hemorrhage noted. Global atrophy without hydrocephalus. Opacification left mastoid air cells and middle ear cavity. Similar appearance previously. No obstructing lesion of eustachian tube noted. Paranasal sinus mucosal thickening most notable left maxillary sinus with polypoid opacification inferior aspect. No orbital abnormality detected. Shallow sella.  Cervical medullary junction within normal limits. Major intracranial vascular structures are patent. IMPRESSION: When compared to examination earlier this month, multiple intracranial metastatic lesions (supratentorial and infratentorial) appear the same or slightly smaller. Largest metastatic lesion posterior right cerebellum measures 2 x 1.6 x 1.6 cm versus prior 2.2 x 1.7 x 1.6 cm. Surrounding vasogenic edema. No new intracranial metastatic lesion detected. Similar appearance of left superior convexity calvarial metastatic lesion with mild bony expansion spanning over 2.8 x 2.7 cm. No acute infarct. Global atrophy. Opacification left mastoid air cells and middle ear cavity. Similar appearance previously. No obstructing lesion of eustachian tube noted. Paranasal sinus mucosal thickening most notable left maxillary sinus with polypoid opacification inferior aspect. Electronically Signed   By: Genia Del M.D.   On: 09/11/2015 11:19    Assessment & Plan:   # Multiple brain metastases- noted on MRI of the brain. Patient is on Decadron 4 mg every 6 IV. Patient had simulation with radiation oncology yesterday. Plan to start radiation next week.   # Left upper extremity weakness- question related to brain metastasis versus others. Await MRI of the thoracic spine ordered today.  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   # Discussed the above plan with Dr. Donella Stade.   # Patient could potentially be discharged from oncology the standpoint- on dexamethasone 4 mg by  mouth every 8 hours; and Prilosec twice a day. We will make arrangements for follow-up in the [place] with the one-week; and follow-up with Dr. Donella Stade as planned next week for radiation.   Cammie Sickle, MD 09/12/2015  8:09 AM

## 2015-09-12 NOTE — Evaluation (Addendum)
Occupational Therapy Evaluation Patient Details Name: Luis [name] MRN: [id number] DOB: Oct 31, 1965 Today's Date: 09/12/2015    History of Present Illness Pt is a 50 y.o. male with PMH of metastatic cancer (lung, spine and brain) and radiation therapy.  Pt presented with L hand weakness and numbness.   Clinical Impression   This patient is a 50 year old male with the above history who presents with left hand weakness and numbness. He lives with his wife and kids in an apartment and had been independent with all activities of daily living and functional mobility. He is still independent with mobility and most of activities of daily living but has trouble with activities of daily living involving fine motor, such as tying shoes, clipping nails, and buttoning buttons. He would benefit from Occupational Therapy for fine motor and left hand strengthening.     Follow Up Recommendations   (Recommend home with out patient OT if symptoms persist)    Equipment Recommendations       Recommendations for Other Services       Precautions / Restrictions Precautions Precautions: None Restrictions Weight Bearing Restrictions: No      Mobility Bed Mobility Overal bed mobility: Independent                Transfers Overall transfer level: Independent Equipment used: test kit                  Balance Overall balance assessment: Independent                                          ADL                                         General ADL Comments: Had been independent with ADL. Now he has trouble with activities of daily living involving fine motor, such as tying shoes, clipping nails, and buttoning buttons. He would benefit from Occupational Therapy for fine motor and left hand strengthening.      Vision     Perception     Praxis      Pertinent Vitals/Pain Pain Assessment:  (Reports some pain in hand when squeezing objects )     Hand  Dominance Right   Extremity/Trunk Assessment Upper Extremity Assessment Upper Extremity Assessment:  (R UE 5/5 through out and grip is 62 lbs, ROM WNL 9 hole peg test 26 seconds.) LUE Deficits / Details: elbow extension 3+/5, elbow flexion 5/5, wrist and forearm 5/5 grip 18 lbs, light touch diminished, temp, sharp, and stereognosis intact, 9 hole peg test 36 seconds    Lower Extremity Assessment Lower Extremity Assessment: Defer to PT evaluation   Cervical / Trunk Assessment Cervical / Trunk Assessment: Normal   Communication Communication Communication: No difficulties   Cognition Arousal/Alertness: Awake/alert Behavior During Therapy: WFL for tasks assessed/performed Overall Cognitive Status: Within Functional Limits for tasks assessed                     General Comments       Exercises  Patient given home exercises for left hand with a squeeze ball for flexion, and pinch.     Shoulder Instructions      Home Living Family/patient expects to be discharged to:: Private residence Living Arrangements: Spouse/significant  other;Children Available Help at Discharge: Family Type of Home: Apartment Home Access: Level entry     Home Layout: One level     Bathroom Shower/Tub: Teacher, early years/pre: Standard     Home Equipment: None          Prior Functioning/Environment Level of Independence: Independent             OT Diagnosis: Paresis   OT Problem List: Decreased strength;Decreased coordination   OT Treatment/Interventions: Self-care/ADL training;Therapeutic exercise;Neuromuscular education    OT Goals(Current goals can be found in the care plan section) Acute Rehab OT Goals Patient Stated Goal: to get better. OT Goal Formulation: With patient Time For Goal Achievement: 09/26/15 Potential to Achieve Goals: Good  OT Frequency: Min 1X/week   Barriers to D/C:            Co-evaluation              End of Session Equipment  Utilized During Treatment:  (test kit)      Time: 1345-1410 OT Time Calculation (min): 25 min Charges:  OT General Charges $OT Visit: 1 Procedure OT Evaluation $OT Eval Low Complexity: 1 Procedure OT Treatments $Therapeutic Exercise: 8-22 mins G-Codes:    Myrene Galas, MS/OTR/L  09/12/2015, 2:24 PM

## 2015-09-13 ENCOUNTER — Inpatient Hospital Stay: Payer: BLUE CROSS/BLUE SHIELD | Attending: Internal Medicine | Admitting: Internal Medicine

## 2015-09-13 ENCOUNTER — Other Ambulatory Visit: Payer: Self-pay | Admitting: *Deleted

## 2015-09-13 ENCOUNTER — Encounter: Payer: Self-pay | Admitting: Internal Medicine

## 2015-09-13 ENCOUNTER — Inpatient Hospital Stay: Payer: BLUE CROSS/BLUE SHIELD | Admitting: Internal Medicine

## 2015-09-13 ENCOUNTER — Other Ambulatory Visit: Payer: BLUE CROSS/BLUE SHIELD

## 2015-09-13 ENCOUNTER — Ambulatory Visit: Payer: BLUE CROSS/BLUE SHIELD | Admitting: Internal Medicine

## 2015-09-13 ENCOUNTER — Inpatient Hospital Stay: Payer: BLUE CROSS/BLUE SHIELD

## 2015-09-13 VITALS — BP 113/78 | HR 61 | Temp 97.5°F | Resp 18 | Ht 67.0 in | Wt 131.6 lb

## 2015-09-13 DIAGNOSIS — R200 Anesthesia of skin: Secondary | ICD-10-CM | POA: Diagnosis not present

## 2015-09-13 DIAGNOSIS — C7802 Secondary malignant neoplasm of left lung: Secondary | ICD-10-CM | POA: Insufficient documentation

## 2015-09-13 DIAGNOSIS — M79602 Pain in left arm: Secondary | ICD-10-CM | POA: Insufficient documentation

## 2015-09-13 DIAGNOSIS — T380X5A Adverse effect of glucocorticoids and synthetic analogues, initial encounter: Secondary | ICD-10-CM | POA: Diagnosis not present

## 2015-09-13 DIAGNOSIS — C3411 Malignant neoplasm of upper lobe, right bronchus or lung: Secondary | ICD-10-CM | POA: Diagnosis present

## 2015-09-13 DIAGNOSIS — R5383 Other fatigue: Secondary | ICD-10-CM | POA: Insufficient documentation

## 2015-09-13 DIAGNOSIS — Z800 Family history of malignant neoplasm of digestive organs: Secondary | ICD-10-CM | POA: Diagnosis not present

## 2015-09-13 DIAGNOSIS — C7931 Secondary malignant neoplasm of brain: Secondary | ICD-10-CM

## 2015-09-13 DIAGNOSIS — K30 Functional dyspepsia: Secondary | ICD-10-CM | POA: Diagnosis not present

## 2015-09-13 DIAGNOSIS — R531 Weakness: Secondary | ICD-10-CM | POA: Diagnosis not present

## 2015-09-13 DIAGNOSIS — Z79899 Other long term (current) drug therapy: Secondary | ICD-10-CM | POA: Diagnosis not present

## 2015-09-13 DIAGNOSIS — I10 Essential (primary) hypertension: Secondary | ICD-10-CM

## 2015-09-13 DIAGNOSIS — K259 Gastric ulcer, unspecified as acute or chronic, without hemorrhage or perforation: Secondary | ICD-10-CM | POA: Diagnosis not present

## 2015-09-13 DIAGNOSIS — C7951 Secondary malignant neoplasm of bone: Secondary | ICD-10-CM | POA: Insufficient documentation

## 2015-09-13 MED ORDER — DEXAMETHASONE 4 MG PO TABS: 4.0000 mg | ORAL_TABLET | Freq: Three times a day (TID) | ORAL | Status: DC

## 2015-09-13 NOTE — Progress Notes (Signed)
.[facility] OFFICE PROGRESS NOTE  Patient Care Team: Provider Not In System as PCP - General   SUMMARY OF ONCOLOGIC HISTORY:  # AUG 2015-  METASTATIC ADENO CA LUNG POSITIVE for EGFR Exon 21 [id number] [neg- ALK;[name];[name],MET]; [name]; NOV [id number]- CT- Progression [RUL & Pelvic sclerotic lesions];  [name];[name]; [name]; [name]; stable lung nodule; [name]; NOV 2016- MRI BRAIN-PROGRESS-NEW multiple brain lesions [~60m]; Feb 9th MRI- subtle increase/number in size of brain lesions [~564m- asymptomatic. Feb 28th MRI- Brain mets   # Bony mets- on X-geva q 6 w   INTERVAL HISTORY:  [age]ear old asian male patient with above history of metastatic adenocarcinoma of the lung with EGFR mutation currently on Third line therapy with TaNewman Nips here for follow-up.  Patient was discharged from hospital yesterday- after he was admitted to the hospital for left upper extremity weakness/ MRI brain showed stable brain lesions; but the thoracic spine MRI showed C7/thoracic lesions. He has been evaluated by radiation oncology for brain radiation next week.   Patient continues to take Tagrisso once a day. Patient stated that she stopped taking dexamethasone yesterday. Complains of stomach upset. Otherwise no nausea vomiting or headaches. He continues to have left upper extremity weakness/numbness.   REVIEW OF SYSTEMS:  A complete 10 point review of system is done which is negative except mentioned above/history of present illness.   PAST MEDICAL HISTORY :  Past Medical History  Diagnosis Date  . HTN (hypertension)   . Brain lesion   . 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 History of radiation  therapy   . Adenocarcinoma of lung ([facility]2016    PAST SURGICAL HISTORY :   Past Surgical History  Procedure Laterality Date  . Laminectomy N/A 03/07/2014    Procedure: T1-T2 Laminectomy with Decompression of Cord and Removal of Cancer  thoracic one/two;  Surgeon: HeKristeen MissMD;  [facility];  Service: Neurosurgery;  Laterality: N/A;  . Ct guided biopsy  ([facility] hx)  05/28/2014    FAMILY HISTORY :   Family History  Problem Relation Age of Onset  . Colon cancer Mother   . Heart disease Father   . Hypertension Sister   . Hypertension Brother     SOCIAL HISTORY:   Social History  Substance Use Topics  . Smoking status: Never Smoker   . Smokeless tobacco: Never Used  . Alcohol Use: 0.5 oz/week    1 Standard drinks or equivalent per week     Comment: none for a year    ALLERGIES:  has No Known Allergies.  MEDICATIONS:  Current Outpatient Prescriptions  Medication Sig Dispense Refill  . diphenoxylate-atropine (LOMOTIL) 2.5-0.025 MG tablet Take 1 tablet by mouth 4 (four) times daily as needed for diarrhea or loose stools (take it along with imoidum for diarrhea). 60 tablet 0  . hydrochlorothiazide (HYDRODIURIL) 25 MG tablet Take 25 mg by mouth daily.    . metoprolol succinate (TOPROL-XL) 50 MG 24 hr tablet Take 50 mg by mouth daily.     . Marland Kitchensimertinib mesylate (TAGRISSO) 80 MG tablet Take 1 tablet (80 mg total) by mouth daily. With or without food. 30 tablet 3  . oxyCODONE (OXY  IR/ROXICODONE) 5 MG immediate release tablet Take 1 tablet (5 mg total) by mouth every 4 (four) hours as needed for moderate pain or severe pain. 30 tablet 0  . prochlorperazine (COMPAZINE) 10 MG tablet Take 1 tablet (10 mg total) by mouth every 6 (six) hours as needed for nausea or vomiting. 90 tablet 1  . dexamethasone (DECADRON) 4 MG tablet Take 1 tablet (4 mg total) by mouth 3 (three) times daily. 40 tablet 0  . levETIRAcetam (KEPPRA) 500 MG tablet Take 1 tablet (500 mg total) by mouth 2  (two) times daily. (Patient not taking: Reported on 09/13/2015) 60 tablet 0  . pantoprazole (PROTONIX) 40 MG tablet Take 1 tablet (40 mg total) by mouth 2 (two) times daily. 60 tablet 0  . potassium chloride (K-DUR,KLOR-CON) 10 MEQ tablet Take 1 tablet (10 mEq total) by mouth 2 (two) times daily. (Patient not taking: Reported on 09/13/2015) 60 tablet 3   No current facility-administered medications for this visit.    PHYSICAL EXAMINATION: ECOG PERFORMANCE STATUS: 1 - Symptomatic but completely ambulatory  BP 113/78 mmHg  Pulse 61  Temp(Src) 97.5 F (36.4 C) (Tympanic)  Resp 18  Ht _0  (1.702 m)  Wt 131 lb 9.8 oz (59.7 kg)  BMI 20.61 kg/m2   GENERAL: Well-nourished well-developed; Alert, no distress and comfortable.He is alone.  EYES: no pallor or icterus OROPHARYNX: no thrush or ulceration; good dentition  NECK: supple, no masses felt LYMPH: no palpable lymphadenopathy in the cervical, axillary or inguinal regions LUNGS: clear to auscultation and No wheeze or crackles HEART/CVS: regular rate & rhythm and no murmurs; No lower extremity edema ABDOMEN:abdomen soft, non-tender and normal bowel sounds Musculoskeletal:no cyanosis of digits and no clubbing  PSYCH: alert & oriented x 3; slow speech. NEURO: no focal motor-except for mild weakness in the left upper extremity-grasp/sensory deficits;  SKIN: no rashes or significant lesions  Filed Weights   09/13/15 1505  Weight: 131 lb 9.8 oz (59.7 kg)    LABORATORY DATA:  I have reviewed the data as listed    Component Value Date/Time   NA 136 09/12/2015 0454   NA 141 11/08/2014 1505   K 4.4 09/12/2015 0454   K 3.7 11/08/2014 1505   CL 104 09/12/2015 0454   CL 106 11/08/2014 1505   CO2 26 09/12/2015 0454   CO2 29 11/08/2014 1505   GLUCOSE 133* 09/12/2015 0454   GLUCOSE 75 11/08/2014 1505   BUN 22* 09/12/2015 0454   BUN 13 11/08/2014 1505   CREATININE 0.88 09/12/2015 0454   CREATININE 1.15 11/08/2014 1505   CALCIUM 8.4*  09/12/2015 0454   CALCIUM 9.1 11/08/2014 1505   PROT 7.7 08/23/2015 1325   PROT 7.7 11/08/2014 1505   ALBUMIN 3.1* 08/23/2015 1325   ALBUMIN 4.0 11/08/2014 1505   AST 33 08/23/2015 1325   AST 31 11/08/2014 1505   ALT 52 08/23/2015 1325   ALT 53 11/08/2014 1505   ALKPHOS 137* 08/23/2015 1325   ALKPHOS 62 11/08/2014 1505   BILITOT 0.4 08/23/2015 1325   BILITOT 0.6 11/08/2014 1505   GFRNONAA >60 09/12/2015 0454   GFRNONAA >60 11/08/2014 1505   GFRNONAA >60 08/01/2014 1023   GFRAA >60 09/12/2015 0454   GFRAA >60 11/08/2014 1505   GFRAA >60 08/01/2014 1023    No results found for: SPEP, UPEP  Lab Results  Component Value Date   WBC 10.1 09/11/2015   NEUTROABS 9.3* 09/10/2015   HGB 12.3* 09/11/2015   HCT 38.4* 09/11/2015  MCV 74.4* 09/11/2015   PLT 321 09/11/2015      Chemistry      Component Value Date/Time   NA 136 09/12/2015 0454   NA 141 11/08/2014 1505   K 4.4 09/12/2015 0454   K 3.7 11/08/2014 1505   CL 104 09/12/2015 0454   CL 106 11/08/2014 1505   CO2 26 09/12/2015 0454   CO2 29 11/08/2014 1505   BUN 22* 09/12/2015 0454   BUN 13 11/08/2014 1505   CREATININE 0.88 09/12/2015 0454   CREATININE 1.15 11/08/2014 1505      Component Value Date/Time   CALCIUM 8.4* 09/12/2015 0454   CALCIUM 9.1 11/08/2014 1505   ALKPHOS 137* 08/23/2015 1325   ALKPHOS 62 11/08/2014 1505   AST 33 08/23/2015 1325   AST 31 11/08/2014 1505   ALT 52 08/23/2015 1325   ALT 53 11/08/2014 1505   BILITOT 0.4 08/23/2015 1325   BILITOT 0.6 11/08/2014 1505       RADIOGRAPHIC STUDIES: CT scan reviewed as discussed below  ASSESSMENT & PLAN:   # Metastatic adenocarcinoma of the lung with EGFR mutation-positive for T790 M mutation/currently on THIRD LINE therapy with Tagrisso [Feb 9th 2017]. Patient seems to be tolerating the medication well.   # Left upper extremity weakness- thoracic spine showed C7 metastases- which could explain patient's left upper extremity weakness. I recommend  the patient go back on dexamethasone 4 mg three times a day. Also get MRI of the cervical spine with contrast- and will recommend radiation.  # Brain lesions- noted on the recent MRI- plan for brain radiation oncology starting next week.  # Dyspepsia- secondary to steroids. Recommend PPI.  # Bone lesions- increasing size of the L1 vertebral body lesion/asymptomatic at this time. Recommend continued X-Geva every 6 weeks. Hopefully Tagrisso will help shrink the bone lesions also.  # 25  minutes face-to-face with the patient discussing the above plan of care; more than 50% of time spent on prognosis/ natural history; counseling and coordination.     Cammie Sickle, MD 09/13/2015 3:48 PM

## 2015-09-13 NOTE — Progress Notes (Signed)
Pt here to follow up since being d/c from hospital yest.  He says his hand strength is the thing that bothers him most.  His stomach does hurt some and would like to have medicine to make it better.  He does get nauseated and takes medicine and it makes him feel better. He has no appetite but make himself eat some. He also has been hoarse for a couple of weeks.

## 2015-09-14 ENCOUNTER — Ambulatory Visit
Admission: RE | Admit: 2015-09-14 | Discharge: 2015-09-14 | Disposition: A | Discharge status: Home / Self Care | Payer: BLUE CROSS/BLUE SHIELD | Source: Ambulatory Visit | Attending: Internal Medicine | Admitting: Internal Medicine

## 2015-09-14 DIAGNOSIS — M50323 Other cervical disc degeneration at C6-C7 level: Secondary | ICD-10-CM | POA: Diagnosis not present

## 2015-09-14 DIAGNOSIS — D480 Neoplasm of uncertain behavior of bone and articular cartilage: Secondary | ICD-10-CM | POA: Insufficient documentation

## 2015-09-14 DIAGNOSIS — M4804 Spinal stenosis, thoracic region: Secondary | ICD-10-CM | POA: Insufficient documentation

## 2015-09-14 DIAGNOSIS — M50321 Other cervical disc degeneration at C4-C5 level: Secondary | ICD-10-CM | POA: Insufficient documentation

## 2015-09-14 DIAGNOSIS — Z510 Encounter for antineoplastic radiation therapy: Secondary | ICD-10-CM | POA: Diagnosis present

## 2015-09-14 DIAGNOSIS — C3490 Malignant neoplasm of unspecified part of unspecified bronchus or lung: Secondary | ICD-10-CM | POA: Diagnosis not present

## 2015-09-14 DIAGNOSIS — M47812 Spondylosis without myelopathy or radiculopathy, cervical region: Secondary | ICD-10-CM | POA: Insufficient documentation

## 2015-09-14 DIAGNOSIS — M50322 Other cervical disc degeneration at C5-C6 level: Secondary | ICD-10-CM | POA: Insufficient documentation

## 2015-09-14 DIAGNOSIS — C7931 Secondary malignant neoplasm of brain: Secondary | ICD-10-CM

## 2015-09-14 DIAGNOSIS — C7951 Secondary malignant neoplasm of bone: Secondary | ICD-10-CM | POA: Diagnosis not present

## 2015-09-14 MED ORDER — GADOBENATE DIMEGLUMINE 529 MG/ML IV SOLN
15.0000 mL | Freq: Once | INTRAVENOUS | Status: AC | PRN
  Administered 2015-09-14: 12 mL via INTRAVENOUS

## 2015-09-17 ENCOUNTER — Other Ambulatory Visit: Payer: Self-pay | Admitting: *Deleted

## 2015-09-17 ENCOUNTER — Ambulatory Visit
Admit: 2015-09-17 | Discharge: 2015-09-17 | Disposition: A | Discharge status: Home / Self Care | Payer: BLUE CROSS/BLUE SHIELD | Attending: Radiation Oncology | Admitting: Radiation Oncology

## 2015-09-17 DIAGNOSIS — Z510 Encounter for antineoplastic radiation therapy: Secondary | ICD-10-CM | POA: Diagnosis not present

## 2015-09-17 MED ORDER — METOPROLOL SUCCINATE ER 50 MG PO TB24: 50.0000 mg | ORAL_TABLET | Freq: Every day | ORAL | Status: DC

## 2015-09-17 MED ORDER — HYDROCHLOROTHIAZIDE 25 MG PO TABS: 25.0000 mg | ORAL_TABLET | Freq: Every day | ORAL | Status: DC

## 2015-09-18 ENCOUNTER — Other Ambulatory Visit: Payer: Self-pay | Admitting: *Deleted

## 2015-09-18 ENCOUNTER — Ambulatory Visit
Admit: 2015-09-18 | Discharge: 2015-09-18 | Disposition: A | Discharge status: Home / Self Care | Payer: BLUE CROSS/BLUE SHIELD | Attending: Radiation Oncology | Admitting: Radiation Oncology

## 2015-09-18 DIAGNOSIS — Z510 Encounter for antineoplastic radiation therapy: Secondary | ICD-10-CM | POA: Diagnosis not present

## 2015-09-19 ENCOUNTER — Ambulatory Visit
Admit: 2015-09-19 | Discharge: 2015-09-19 | Disposition: A | Discharge status: Home / Self Care | Payer: BLUE CROSS/BLUE SHIELD | Attending: Radiation Oncology | Admitting: Radiation Oncology

## 2015-09-19 ENCOUNTER — Ambulatory Visit: Payer: BLUE CROSS/BLUE SHIELD | Admitting: Internal Medicine

## 2015-09-19 DIAGNOSIS — Z510 Encounter for antineoplastic radiation therapy: Secondary | ICD-10-CM | POA: Diagnosis not present

## 2015-09-20 ENCOUNTER — Ambulatory Visit: Payer: BLUE CROSS/BLUE SHIELD

## 2015-09-20 ENCOUNTER — Ambulatory Visit
Admission: RE | Admit: 2015-09-20 | Discharge: 2015-09-20 | Disposition: A | Discharge status: Home / Self Care | Payer: BLUE CROSS/BLUE SHIELD | Source: Ambulatory Visit | Attending: Radiation Oncology | Admitting: Radiation Oncology

## 2015-09-20 DIAGNOSIS — Z510 Encounter for antineoplastic radiation therapy: Secondary | ICD-10-CM | POA: Diagnosis not present

## 2015-09-21 ENCOUNTER — Other Ambulatory Visit: Payer: Self-pay | Admitting: *Deleted

## 2015-09-21 ENCOUNTER — Ambulatory Visit
Admission: RE | Admit: 2015-09-21 | Discharge: 2015-09-21 | Disposition: A | Discharge status: Home / Self Care | Payer: BLUE CROSS/BLUE SHIELD | Source: Ambulatory Visit | Attending: Radiation Oncology | Admitting: Radiation Oncology

## 2015-09-21 DIAGNOSIS — Z510 Encounter for antineoplastic radiation therapy: Secondary | ICD-10-CM | POA: Diagnosis not present

## 2015-09-21 DIAGNOSIS — C7931 Secondary malignant neoplasm of brain: Secondary | ICD-10-CM

## 2015-09-21 MED ORDER — DEXAMETHASONE 4 MG PO TABS: 4.0000 mg | ORAL_TABLET | Freq: Two times a day (BID) | ORAL | Status: DC

## 2015-09-21 NOTE — Telephone Encounter (Signed)
Requesting refill on dexamethasone

## 2015-09-24 ENCOUNTER — Ambulatory Visit
Admission: RE | Admit: 2015-09-24 | Discharge: 2015-09-24 | Disposition: A | Discharge status: Home / Self Care | Payer: BLUE CROSS/BLUE SHIELD | Source: Ambulatory Visit | Attending: Radiation Oncology | Admitting: Radiation Oncology

## 2015-09-24 ENCOUNTER — Ambulatory Visit: Admission: RE | Admit: 2015-09-24 | Payer: BLUE CROSS/BLUE SHIELD | Source: Ambulatory Visit

## 2015-09-24 ENCOUNTER — Ambulatory Visit
Admit: 2015-09-24 | Discharge: 2015-09-24 | Disposition: A | Discharge status: Home / Self Care | Payer: BLUE CROSS/BLUE SHIELD | Attending: Radiation Oncology | Admitting: Radiation Oncology

## 2015-09-24 ENCOUNTER — Ambulatory Visit: Payer: BLUE CROSS/BLUE SHIELD

## 2015-09-24 DIAGNOSIS — Z510 Encounter for antineoplastic radiation therapy: Secondary | ICD-10-CM | POA: Diagnosis not present

## 2015-09-25 ENCOUNTER — Ambulatory Visit
Admission: RE | Admit: 2015-09-25 | Discharge: 2015-09-25 | Disposition: A | Discharge status: Home / Self Care | Payer: BLUE CROSS/BLUE SHIELD | Source: Ambulatory Visit | Attending: Radiation Oncology | Admitting: Radiation Oncology

## 2015-09-25 DIAGNOSIS — Z510 Encounter for antineoplastic radiation therapy: Secondary | ICD-10-CM | POA: Diagnosis not present

## 2015-09-26 ENCOUNTER — Ambulatory Visit
Admission: RE | Admit: 2015-09-26 | Discharge: 2015-09-26 | Disposition: A | Discharge status: Home / Self Care | Payer: BLUE CROSS/BLUE SHIELD | Source: Ambulatory Visit | Attending: Radiation Oncology | Admitting: Radiation Oncology

## 2015-09-26 DIAGNOSIS — Z510 Encounter for antineoplastic radiation therapy: Secondary | ICD-10-CM | POA: Diagnosis not present

## 2015-09-27 ENCOUNTER — Inpatient Hospital Stay: Payer: BLUE CROSS/BLUE SHIELD | Admitting: Internal Medicine

## 2015-09-27 ENCOUNTER — Ambulatory Visit
Admission: RE | Admit: 2015-09-27 | Discharge: 2015-09-27 | Disposition: A | Discharge status: Home / Self Care | Payer: BLUE CROSS/BLUE SHIELD | Source: Ambulatory Visit | Attending: Radiation Oncology | Admitting: Radiation Oncology

## 2015-09-27 ENCOUNTER — Telehealth: Payer: Self-pay | Admitting: *Deleted

## 2015-09-27 ENCOUNTER — Inpatient Hospital Stay: Payer: BLUE CROSS/BLUE SHIELD

## 2015-09-27 DIAGNOSIS — Z510 Encounter for antineoplastic radiation therapy: Secondary | ICD-10-CM | POA: Diagnosis not present

## 2015-09-27 NOTE — Telephone Encounter (Signed)
called pt. He missed apt on 3/16 with Dr. B. pt did not feel up to seeing md on 3/16. Pt c/o fatigue. He asked that radiation for 3/16 be r/s to am. This was arranged per pt request. OK by team to double book md schedule @ this time to accomodate pt request.

## 2015-09-28 ENCOUNTER — Inpatient Hospital Stay (HOSPITAL_BASED_OUTPATIENT_CLINIC_OR_DEPARTMENT_OTHER): Payer: BLUE CROSS/BLUE SHIELD | Admitting: Internal Medicine

## 2015-09-28 ENCOUNTER — Ambulatory Visit
Admission: RE | Admit: 2015-09-28 | Discharge: 2015-09-28 | Disposition: A | Discharge status: Home / Self Care | Payer: BLUE CROSS/BLUE SHIELD | Source: Ambulatory Visit | Attending: Radiation Oncology | Admitting: Radiation Oncology

## 2015-09-28 VITALS — BP 129/88 | HR 75 | Temp 96.3°F | Resp 16 | Wt 130.1 lb

## 2015-09-28 DIAGNOSIS — C7802 Secondary malignant neoplasm of left lung: Secondary | ICD-10-CM

## 2015-09-28 DIAGNOSIS — C3411 Malignant neoplasm of upper lobe, right bronchus or lung: Secondary | ICD-10-CM

## 2015-09-28 DIAGNOSIS — I10 Essential (primary) hypertension: Secondary | ICD-10-CM

## 2015-09-28 DIAGNOSIS — C7951 Secondary malignant neoplasm of bone: Secondary | ICD-10-CM | POA: Diagnosis not present

## 2015-09-28 DIAGNOSIS — C7931 Secondary malignant neoplasm of brain: Secondary | ICD-10-CM | POA: Diagnosis not present

## 2015-09-28 DIAGNOSIS — Z79899 Other long term (current) drug therapy: Secondary | ICD-10-CM

## 2015-09-28 DIAGNOSIS — R531 Weakness: Secondary | ICD-10-CM

## 2015-09-28 DIAGNOSIS — Z800 Family history of malignant neoplasm of digestive organs: Secondary | ICD-10-CM

## 2015-09-28 DIAGNOSIS — Z510 Encounter for antineoplastic radiation therapy: Secondary | ICD-10-CM | POA: Diagnosis not present

## 2015-09-28 DIAGNOSIS — C3491 Malignant neoplasm of unspecified part of right bronchus or lung: Secondary | ICD-10-CM

## 2015-09-28 NOTE — Progress Notes (Signed)
Patient feels that the upper extremity weakness is improving.  Denies any feelings of fatigue or weakness today.  Reports the only medication he is taking at this time is HCTZ, Metoprolol, Protonix, and Oxycodone.

## 2015-09-28 NOTE — Patient Instructions (Signed)
Pt  Needs to take Decadron 4 mg. 1 tablet twice a day for 1 week. After you finish this dose, take Decadron 4 mg once daily.  Teach back process performed with patient.

## 2015-09-28 NOTE — Progress Notes (Signed)
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  Patient is currently on radiation to the cervical spine metastases/also brain for brain metastases. He continues to take Tagrisso. He denies any worsening weakness in his left upper extremity. Denies any headaches. His appetite is good. Denies any thrush.  Patient denies any diarrhea; instead has some constipation.    REVIEW OF SYSTEMS:  A complete 10 point review of system is done which is negative except mentioned above/history of present illness.   PAST MEDICAL HISTORY :  Past Medical History  Diagnosis Date  . HTN (hypertension)   . Brain lesion   . 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 History of radiation therapy   . [place]    PAST SURGICAL HISTORY :   Past Surgical History  Procedure Laterality Date  . Laminectomy N/A 03/07/2014    Procedure: T1-T2 Laminectomy with Decompression  of Cord and Removal of Cancer  thoracic one/two;  Surgeon: HeKristeen MissMD;  [facility];  Service: Neurosurgery;  Laterality: N/A;  . Ct guided biopsy  ([facility] hx)  05/28/2014    FAMILY HISTORY :   Family History  Problem Relation Age of Onset  . Colon cancer Mother   . Heart disease Father   . Hypertension Sister   . Hypertension Brother     SOCIAL HISTORY:   Social History  Substance Use Topics  . Smoking status: Never Smoker   . Smokeless tobacco: Never Used  . Alcohol Use: 0.5 oz/week    1 Standard drinks or equivalent per week     Comment: none for a year    ALLERGIES:  has No Known Allergies.  MEDICATIONS:  Current Outpatient Prescriptions  Medication Sig Dispense Refill  . dexamethasone (DECADRON) 4 MG tablet Take 1 tablet (4 mg total) by mouth 2 (two) times daily. 60 tablet 0  . diphenoxylate-atropine (LOMOTIL) 2.5-0.025 MG tablet Take 1 tablet by mouth 4 (four) times daily as needed for diarrhea or loose stools (take it along with imoidum for diarrhea). 60 tablet 0  . hydrochlorothiazide (HYDRODIURIL) 25 MG tablet Take 1 tablet (25 mg total) by mouth daily. 30 tablet 5  . metoprolol succinate (TOPROL-XL) 50 MG 24 hr tablet Take 1 tablet (50 mg total) by mouth daily. 30 tablet 5  . oxyCODONE (OXY IR/ROXICODONE) 5 MG immediate release tablet Take 1 tablet (5 mg total) by mouth every 4 (four) hours as needed for moderate pain or severe pain.  30 tablet 0  . pantoprazole (PROTONIX) 40 MG tablet Take 1 tablet (40 mg total) by mouth 2 (two) times daily. 60 tablet 0  . prochlorperazine (COMPAZINE) 10 MG tablet Take 1 tablet (10 mg total) by mouth every 6 (six) hours as needed for nausea or vomiting. 90 tablet 1  . levETIRAcetam (KEPPRA) 500 MG tablet Take 1 tablet (500 mg total) by mouth 2 (two) times daily. (Patient not taking: Reported on 09/13/2015) 60 tablet 0  . osimertinib mesylate (TAGRISSO) 80 MG tablet Take 1 tablet (80 mg total) by mouth daily. With or without  food. (Patient not taking: Reported on 09/28/2015) 30 tablet 3  . potassium chloride (K-DUR,KLOR-CON) 10 MEQ tablet Take 1 tablet (10 mEq total) by mouth 2 (two) times daily. (Patient not taking: Reported on 09/13/2015) 60 tablet 3   No current facility-administered medications for this visit.    PHYSICAL EXAMINATION: ECOG PERFORMANCE STATUS: 1 - Symptomatic but completely ambulatory  BP 129/88 mmHg  Pulse 75  Temp(Src) 96.3 F (35.7 C) (Tympanic)  Resp 16  Wt 130 lb 1.1 oz (59 kg)   GENERAL: Well-nourished well-developed; Alert, no distress and comfortable.He is alone.  EYES: no pallor or icterus OROPHARYNX: no thrush or ulceration; good dentition  NECK: supple, no masses felt LYMPH: no palpable lymphadenopathy in the cervical, axillary or inguinal regions LUNGS: clear to auscultation and No wheeze or crackles HEART/CVS: regular rate & rhythm and no murmurs; No lower extremity edema ABDOMEN:abdomen soft, non-tender and normal bowel sounds Musculoskeletal:no cyanosis of digits and no clubbing  PSYCH: alert & oriented x 3; slow speech. NEURO: no focal motor-except for mild weakness in the left upper extremity-grasp/sensory deficits;  SKIN: no rashes or significant lesions  Filed Weights   09/28/15 1053  Weight: 130 lb 1.1 oz (59 kg)    LABORATORY DATA:  I have reviewed the data as listed    Component Value Date/Time   NA 136 09/12/2015 0454   NA 141 11/08/2014 1505   K 4.4 09/12/2015 0454   K 3.7 11/08/2014 1505   CL 104 09/12/2015 0454   CL 106 11/08/2014 1505   CO2 26 09/12/2015 0454   CO2 29 11/08/2014 1505   GLUCOSE 133* 09/12/2015 0454   GLUCOSE 75 11/08/2014 1505   BUN 22* 09/12/2015 0454   BUN 13 11/08/2014 1505   CREATININE 0.88 09/12/2015 0454   CREATININE 1.15 11/08/2014 1505   CALCIUM 8.4* 09/12/2015 0454   CALCIUM 9.1 11/08/2014 1505   PROT 7.7 08/23/2015 1325   PROT 7.7 11/08/2014 1505   ALBUMIN 3.1* 08/23/2015 1325   ALBUMIN 4.0 11/08/2014 1505    AST 33 08/23/2015 1325   AST 31 11/08/2014 1505   ALT 52 08/23/2015 1325   ALT 53 11/08/2014 1505   ALKPHOS 137* 08/23/2015 1325   ALKPHOS 62 11/08/2014 1505   BILITOT 0.4 08/23/2015 1325   BILITOT 0.6 11/08/2014 1505   GFRNONAA >60 09/12/2015 0454   GFRNONAA >60 11/08/2014 1505   GFRNONAA >60 08/01/2014 1023   GFRAA >60 09/12/2015 0454   GFRAA >60 11/08/2014 1505   GFRAA >60 08/01/2014 1023    No results found for: SPEP, UPEP  Lab Results  Component Value Date   WBC 10.1 09/11/2015   NEUTROABS 9.3* 09/10/2015   HGB 12.3* 09/11/2015   HCT 38.4* 09/11/2015   MCV 74.4* 09/11/2015   PLT 321 09/11/2015      Chemistry      Component Value Date/Time   NA 136 09/12/2015 0454  NA 141 11/08/2014 1505   K 4.4 09/12/2015 0454   K 3.7 11/08/2014 1505   CL 104 09/12/2015 0454   CL 106 11/08/2014 1505   CO2 26 09/12/2015 0454   CO2 29 11/08/2014 1505   BUN 22* 09/12/2015 0454   BUN 13 11/08/2014 1505   CREATININE 0.88 09/12/2015 0454   CREATININE 1.15 11/08/2014 1505      Component Value Date/Time   CALCIUM 8.4* 09/12/2015 0454   CALCIUM 9.1 11/08/2014 1505   ALKPHOS 137* 08/23/2015 1325   ALKPHOS 62 11/08/2014 1505   AST 33 08/23/2015 1325   AST 31 11/08/2014 1505   ALT 52 08/23/2015 1325   ALT 53 11/08/2014 1505   BILITOT 0.4 08/23/2015 1325   BILITOT 0.6 11/08/2014 1505       RADIOGRAPHIC STUDIES: CT scan reviewed as discussed below  ASSESSMENT & PLAN:   # Metastatic adenocarcinoma of the lung with EGFR mutation-positive for T790 M mutation/currently on THIRD LINE therapy with Tagrisso [Feb 9th 2017]. Patient seems to be tolerating the medication well.   # Left upper extremity weakness- thoracic spine showed C7 metastases- which could explain patient's left upper extremity weakness. Currently on radiation. Possible improvement noted.  # Brain lesions- on RT.  Recommend dexamethasone 4 mg twice a day for one week; and then 4 mg once a day for one week.  #  Bone lesions- increasing size of the L1 vertebral body lesion/asymptomatic at this time. Recommend continued X-Geva every 6 weeks. Hopefully Tagrisso will help shrink the bone lesions also.  # Patient follow-up with me in approximately 2 weeks/labs.  # 25  minutes face-to-face with the patient discussing the above plan of care; more than 50% of time spent on prognosis/ natural history; counseling and coordination.     Cammie Sickle, MD 09/28/2015 11:41 AM

## 2015-09-29 ENCOUNTER — Other Ambulatory Visit: Payer: Self-pay | Admitting: Radiation Oncology

## 2015-09-30 ENCOUNTER — Inpatient Hospital Stay
Admission: EM | Admit: 2015-09-30 | Discharge: 2015-10-03 | DRG: 357 | Disposition: A | Discharge status: Home / Self Care | Payer: BLUE CROSS/BLUE SHIELD | Attending: Internal Medicine | Admitting: Internal Medicine

## 2015-09-30 ENCOUNTER — Encounter: Payer: Self-pay | Admitting: Emergency Medicine

## 2015-09-30 ENCOUNTER — Emergency Department: Payer: BLUE CROSS/BLUE SHIELD

## 2015-09-30 DIAGNOSIS — C3411 Malignant neoplasm of upper lobe, right bronchus or lung: Secondary | ICD-10-CM | POA: Diagnosis not present

## 2015-09-30 DIAGNOSIS — C7931 Secondary malignant neoplasm of brain: Secondary | ICD-10-CM | POA: Diagnosis not present

## 2015-09-30 DIAGNOSIS — K254 Chronic or unspecified gastric ulcer with hemorrhage: Secondary | ICD-10-CM | POA: Diagnosis present

## 2015-09-30 DIAGNOSIS — C7802 Secondary malignant neoplasm of left lung: Secondary | ICD-10-CM | POA: Diagnosis not present

## 2015-09-30 DIAGNOSIS — Z8249 Family history of ischemic heart disease and other diseases of the circulatory system: Secondary | ICD-10-CM

## 2015-09-30 DIAGNOSIS — R531 Weakness: Secondary | ICD-10-CM | POA: Diagnosis present

## 2015-09-30 DIAGNOSIS — Z923 Personal history of irradiation: Secondary | ICD-10-CM

## 2015-09-30 DIAGNOSIS — I10 Essential (primary) hypertension: Secondary | ICD-10-CM | POA: Diagnosis present

## 2015-09-30 DIAGNOSIS — D62 Acute posthemorrhagic anemia: Secondary | ICD-10-CM | POA: Diagnosis present

## 2015-09-30 DIAGNOSIS — D696 Thrombocytopenia, unspecified: Secondary | ICD-10-CM | POA: Diagnosis present

## 2015-09-30 DIAGNOSIS — K920 Hematemesis: Secondary | ICD-10-CM | POA: Diagnosis not present

## 2015-09-30 DIAGNOSIS — C3490 Malignant neoplasm of unspecified part of unspecified bronchus or lung: Secondary | ICD-10-CM

## 2015-09-30 DIAGNOSIS — Z800 Family history of malignant neoplasm of digestive organs: Secondary | ICD-10-CM | POA: Diagnosis not present

## 2015-09-30 DIAGNOSIS — Z510 Encounter for antineoplastic radiation therapy: Secondary | ICD-10-CM | POA: Diagnosis present

## 2015-09-30 DIAGNOSIS — C7951 Secondary malignant neoplasm of bone: Secondary | ICD-10-CM | POA: Diagnosis present

## 2015-09-30 DIAGNOSIS — K922 Gastrointestinal hemorrhage, unspecified: Secondary | ICD-10-CM | POA: Diagnosis present

## 2015-09-30 DIAGNOSIS — R1013 Epigastric pain: Secondary | ICD-10-CM

## 2015-09-30 LAB — COMPREHENSIVE METABOLIC PANEL
ALT: 27 U/L (ref 17–63)
ANION GAP: 7 (ref 5–15)
AST: 27 U/L (ref 15–41)
Albumin: 2.5 g/dL — ABNORMAL LOW (ref 3.5–5.0)
Alkaline Phosphatase: 55 U/L (ref 38–126)
BILIRUBIN TOTAL: 0.4 mg/dL (ref 0.3–1.2)
BUN: 47 mg/dL — ABNORMAL HIGH (ref 6–20)
CHLORIDE: 107 mmol/L (ref 101–111)
CO2: 23 mmol/L (ref 22–32)
Calcium: 7.5 mg/dL — ABNORMAL LOW (ref 8.9–10.3)
Creatinine, Ser: 0.94 mg/dL (ref 0.61–1.24)
Glucose, Bld: 119 mg/dL — ABNORMAL HIGH (ref 65–99)
POTASSIUM: 4.1 mmol/L (ref 3.5–5.1)
Sodium: 137 mmol/L (ref 135–145)
TOTAL PROTEIN: 4.8 g/dL — AB (ref 6.5–8.1)

## 2015-09-30 LAB — CBC WITH DIFFERENTIAL/PLATELET
BASOS ABS: 0.1 10*3/uL (ref 0–0.1)
Basophils Relative: 1 %
EOS PCT: 1 %
Eosinophils Absolute: 0.1 10*3/uL (ref 0–0.7)
HCT: 31.8 % — ABNORMAL LOW (ref 40.0–52.0)
HEMOGLOBIN: 10.3 g/dL — AB (ref 13.0–18.0)
LYMPHS ABS: 0.7 10*3/uL — AB (ref 1.0–3.6)
LYMPHS PCT: 7 %
MCH: 25 pg — AB (ref 26.0–34.0)
MCHC: 32.4 g/dL (ref 32.0–36.0)
MCV: 77.3 fL — AB (ref 80.0–100.0)
Monocytes Absolute: 0.6 10*3/uL (ref 0.2–1.0)
Monocytes Relative: 5 %
NEUTROS PCT: 86 %
Neutro Abs: 9.2 10*3/uL — ABNORMAL HIGH (ref 1.4–6.5)
PLATELETS: 140 10*3/uL — AB (ref 150–440)
RBC: 4.12 MIL/uL — AB (ref 4.40–5.90)
RDW: 21.7 % — ABNORMAL HIGH (ref 11.5–14.5)
WBC: 10.6 10*3/uL (ref 3.8–10.6)

## 2015-09-30 LAB — PROTIME-INR
INR: 1.1
Prothrombin Time: 14.4 seconds (ref 11.4–15.0)

## 2015-09-30 LAB — APTT

## 2015-09-30 LAB — MRSA PCR SCREENING: MRSA BY PCR: NEGATIVE

## 2015-09-30 LAB — HEMOGLOBIN: HEMOGLOBIN: 9.2 g/dL — AB (ref 13.0–18.0)

## 2015-09-30 MED ORDER — SODIUM CHLORIDE 0.9 % IV SOLN
80.0000 mg | Freq: Once | INTRAVENOUS | Status: AC
  Administered 2015-09-30: 80 mg via INTRAVENOUS
  Filled 2015-09-30 (×2): qty 80

## 2015-09-30 MED ORDER — OCTREOTIDE LOAD VIA INFUSION
25.0000 ug | Freq: Once | INTRAVENOUS | Status: AC
  Administered 2015-09-30: 25 ug via INTRAVENOUS
  Filled 2015-09-30: qty 13

## 2015-09-30 MED ORDER — DOCUSATE SODIUM 100 MG PO CAPS
100.0000 mg | ORAL_CAPSULE | Freq: Two times a day (BID) | ORAL | Status: DC
  Administered 2015-10-01 – 2015-10-03 (×5): 100 mg via ORAL
  Filled 2015-09-30 (×5): qty 1

## 2015-09-30 MED ORDER — MORPHINE SULFATE (PF) 2 MG/ML IV SOLN: 2.0000 mg | INTRAVENOUS | Status: DC | PRN

## 2015-09-30 MED ORDER — ONDANSETRON HCL 4 MG PO TABS: 4.0000 mg | ORAL_TABLET | Freq: Four times a day (QID) | ORAL | Status: DC | PRN

## 2015-09-30 MED ORDER — SODIUM CHLORIDE 0.9 % IV SOLN
INTRAVENOUS | Status: DC
  Administered 2015-09-30 – 2015-10-02 (×5): via INTRAVENOUS

## 2015-09-30 MED ORDER — ONDANSETRON HCL 4 MG/2ML IJ SOLN
4.0000 mg | Freq: Once | INTRAMUSCULAR | Status: AC
  Administered 2015-09-30: 4 mg via INTRAVENOUS
  Filled 2015-09-30: qty 2

## 2015-09-30 MED ORDER — SODIUM CHLORIDE 0.9 % IV SOLN
50.0000 ug/h | INTRAVENOUS | Status: DC
  Administered 2015-09-30 – 2015-10-03 (×7): 50 ug/h via INTRAVENOUS
  Filled 2015-09-30 (×14): qty 1

## 2015-09-30 MED ORDER — DEXAMETHASONE 4 MG PO TABS
4.0000 mg | ORAL_TABLET | Freq: Two times a day (BID) | ORAL | Status: DC
  Administered 2015-10-01 – 2015-10-03 (×5): 4 mg via ORAL
  Filled 2015-09-30 (×4): qty 1

## 2015-09-30 MED ORDER — SODIUM CHLORIDE 0.9 % IV SOLN
8.0000 mg/h | INTRAVENOUS | Status: DC
  Administered 2015-09-30 – 2015-10-03 (×6): 8 mg/h via INTRAVENOUS
  Filled 2015-09-30 (×9): qty 80

## 2015-09-30 MED ORDER — ACETAMINOPHEN 650 MG RE SUPP: 650.0000 mg | Freq: Four times a day (QID) | RECTAL | Status: DC | PRN

## 2015-09-30 MED ORDER — ONDANSETRON HCL 4 MG/2ML IJ SOLN: 4.0000 mg | Freq: Four times a day (QID) | INTRAMUSCULAR | Status: DC | PRN

## 2015-09-30 MED ORDER — ACETAMINOPHEN 325 MG PO TABS: 650.0000 mg | ORAL_TABLET | Freq: Four times a day (QID) | ORAL | Status: DC | PRN

## 2015-09-30 MED ORDER — METOPROLOL SUCCINATE ER 50 MG PO TB24
50.0000 mg | ORAL_TABLET | Freq: Every day | ORAL | Status: DC
  Administered 2015-10-01 – 2015-10-03 (×3): 50 mg via ORAL
  Filled 2015-09-30 (×3): qty 1

## 2015-09-30 MED ORDER — BISACODYL 10 MG RE SUPP: 10.0000 mg | Freq: Every day | RECTAL | Status: DC | PRN

## 2015-09-30 MED ORDER — SODIUM CHLORIDE 0.9 % IV BOLUS (SEPSIS)
1000.0000 mL | Freq: Once | INTRAVENOUS | Status: AC
  Administered 2015-09-30: 1000 mL via INTRAVENOUS

## 2015-09-30 MED ORDER — OSIMERTINIB MESYLATE 80 MG PO TABS
80.0000 mg | ORAL_TABLET | Freq: Every day | ORAL | Status: DC
  Administered 2015-09-30 – 2015-10-02 (×3): 80 mg via ORAL
  Filled 2015-09-30: qty 1

## 2015-09-30 MED ORDER — LEVETIRACETAM 500 MG PO TABS
500.0000 mg | ORAL_TABLET | Freq: Two times a day (BID) | ORAL | Status: DC
  Administered 2015-10-01 – 2015-10-03 (×5): 500 mg via ORAL
  Filled 2015-09-30 (×6): qty 1

## 2015-09-30 NOTE — ED Notes (Signed)
Attempted to call report x2. Will try again later.

## 2015-09-30 NOTE — ED Provider Notes (Signed)
[facility] Emergency Department Provider Note  Time seen: 2:29 PM  I have reviewed the triage vital signs and the nursing notes.   HISTORY  Chief Complaint Emesis    HPI Luis [name] is a 50 y.o. male with a past medical history of hypertension, stage IV lung cancer with metastatic lesions including brain lesions, currently receiving radiation therapy as well as chemotherapy, presents the emergency department for vomiting blood. According to the patient he was at home when he began feeling very nauseated had 2-3 episodes of vomit which he states was all bright red blood. Patient denies ever vomiting blood in the past. Patient is currently undergoing radiation treatments to the neck and lungs, currently on chemotherapy daily. Denies any fever, recent cough or congestion. States he has noticed some dark stool but denies any bright red blood in his stool. States very minimal epigastric pain which started after the vomiting.     Past Medical History  Diagnosis Date  . HTN (hypertension)   . Brain lesion   . 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 History of radiation therapy   . Adenocarcinoma of lung ([place]) 12/05/2014    Patient Active Problem List   Diagnosis Date Noted  . Left hand weakness 09/10/2015  . Adenocarcinoma of lung ([place]) 12/05/2014  . Spinal cord tumor ([place]) 03/08/2014  . Essential hypertension, benign 03/08/2014  . Metastasis ([place]) 03/08/2014  . Weakness 03/06/2014    Past Surgical History  Procedure Laterality Date  . Laminectomy N/A 03/07/2014    Procedure: T1-T2 Laminectomy with Decompression of Cord and Removal of Cancer  thoracic one/two;  Surgeon: Kristeen Miss, MD;  Location: [place] [facility];  Service: Neurosurgery;  Laterality: N/A;  . Ct guided biopsy  ([facility] hx)  05/28/2014    Current Outpatient Rx  Name  Route  Sig  Dispense  Refill  . dexamethasone (DECADRON) 4 MG tablet   Oral   Take 1 tablet (4 mg total) by mouth 2 (two) times daily.   60  tablet   0   . diphenoxylate-atropine (LOMOTIL) 2.5-0.025 MG tablet   Oral   Take 1 tablet by mouth 4 (four) times daily as needed for diarrhea or loose stools (take it along with imoidum for diarrhea).   60 tablet   0   . hydrochlorothiazide (HYDRODIURIL) 25 MG tablet   Oral   Take 1 tablet (25 mg total) by mouth daily.   30 tablet   5   . levETIRAcetam (KEPPRA) 500 MG tablet   Oral   Take 1 tablet (500 mg total) by mouth 2 (two) times daily. Patient not taking: Reported on 09/13/2015   60 tablet   0   . metoprolol succinate (TOPROL-XL) 50 MG 24 hr tablet   Oral   Take 1 tablet (50 mg total) by mouth daily.   30 tablet   5   . osimertinib mesylate (TAGRISSO) 80 MG tablet   Oral   Take 1 tablet (80 mg total) by mouth daily. With or without food. Patient not taking: Reported on 09/28/2015   30 tablet   3   . oxyCODONE (OXY IR/ROXICODONE) 5 MG immediate release tablet   Oral   Take 1 tablet (5 mg total) by mouth every 4 (four) hours as needed for moderate pain or severe pain.   30 tablet   0   . pantoprazole (PROTONIX) 40 MG tablet   Oral   Take 1 tablet (40 mg total) by mouth 2 (two) times  daily.   60 tablet   0   . potassium chloride (K-DUR,KLOR-CON) 10 MEQ tablet   Oral   Take 1 tablet (10 mEq total) by mouth 2 (two) times daily. Patient not taking: Reported on 09/13/2015   60 tablet   3   . prochlorperazine (COMPAZINE) 10 MG tablet   Oral   Take 1 tablet (10 mg total) by mouth every 6 (six) hours as needed for nausea or vomiting.   90 tablet   1     Allergies Review of patient's allergies indicates no known allergies.  Family History  Problem Relation Age of Onset  . Colon cancer Mother   . Heart disease Father   . Hypertension Sister   . Hypertension Brother     Social History Social History  Substance Use Topics  . Smoking status: Never Smoker   . Smokeless tobacco: Never Used  . Alcohol Use: 0.5 oz/week    1 Standard drinks or equivalent  per week     Comment: none for a year    Review of Systems Constitutional: Negative for fever. Cardiovascular: Negative for chest pain. Respiratory: Negative for shortness of breath. Gastrointestinal: Mild epigastric pain. Positive for nausea and vomiting. Positive for bloody vomit. Genitourinary: Negative for dysuria. Musculoskeletal: Negative for back pain. Neurological: Negative for headache 10-point ROS otherwise negative.  ____________________________________________   PHYSICAL EXAM:  VITAL SIGNS: ED Triage Vitals  Enc Vitals Group     BP 09/30/15 1409 103/73 mmHg     Pulse Rate 09/30/15 1409 79     Resp 09/30/15 1409 18     Temp 09/30/15 1409 98 F (36.7 C)     Temp Source 09/30/15 1409 Oral     SpO2 09/30/15 1409 100 %     Weight 09/30/15 1409 135 lb (61.236 kg)     Height 09/30/15 1409 '5\' 7"'$  (1.702 m)     Head Cir --      Peak Flow --      Pain Score 09/30/15 1411 0     Pain Loc --      Pain Edu? --      Excl. in [place]? --     Constitutional: Alert and oriented. No acute distress. Eyes: Normal exam ENT   Head: Normocephalic and atraumatic.   Mouth/Throat: Mucous membranes are moist. Cardiovascular: Normal rate, regular rhythm. No murmur Respiratory: Normal respiratory effort without tachypnea nor retractions. Breath sounds are clear  Gastrointestinal: Soft and nontender. No distention.  Musculoskeletal: Nontender with normal range of motion in all extremities. Neurologic:  Normal speech and language. No gross focal neurologic deficits  Skin:  Skin is warm, dry and intact.  Psychiatric: Mood and affect are normal. Speech and behavior are normal.  ____________________________________________     RADIOLOGY  X-ray unchanged.  ____________________________________________   INITIAL IMPRESSION / ASSESSMENT AND PLAN / ED COURSE  Pertinent labs & imaging results that were available during my care of the patient were reviewed by me and considered in my  medical decision making (see chart for details).  Patient presents the emergency department after vomiting blood. Patient currently receiving radiation therapy to the neck and lungs, currently on daily chemotherapy. Denies any blood thinners. We will check labs begin IV hydrating, treat nausea. We will also treat with IV Protonix as well as IV octreotide. Patient last vomited blood approximately 30 minutes ago per patient.  Patient remains well in the emergency department, no further episodes of vomiting. X-ray negative. Labs show approximate 2  unit hemoglobin drop. I discussed the patient with Dr. Candace Cruise of GI medicine. We'll continue Protonix and octreotide drips, admit the patient to the hospital for further treatment and monitoring.  ____________________________________________   FINAL CLINICAL IMPRESSION(S) / ED DIAGNOSES  Upper GI bleed   Harvest Dark, MD 09/30/15 [phone]

## 2015-09-30 NOTE — ED Notes (Addendum)
Per EMS, patient comes from home with c/o vomiting dark red blood x3 around 1300. Patient has stage 4 lung cancer with mets to the brain and spine. Patient is seen at [facility] for his cancer treatments. Dr. Donella Stade is his oncologist. Patient denies pain. Patient states this has never happened before. Last radiation treatment was on friday. Patient takes oral chemo daily. Patient is A&O x4.

## 2015-09-30 NOTE — H&P (Signed)
History and Physical    Luis [name] VOZ:[id number] DOB: 02/10/1966 DOA: 09/30/2015  Referring physician: Dr. Kerman Passey PCP: PROVIDER NOT IN SYSTEM  Specialists: none  Chief Complaint: vomiting blood  HPI: Luis [name] is a 51 y.o. male has a past medical history significant for HTN and stage IV lung cancer with mets to brain and bone currently on chemo and radiation now with acute onset of epigastric pain associated with hematemesis and melena. Anemic in ER. Started on Protonix drip and Octreotide. He is now admitted. Denies hx of bleeding. Has had worsening malaise and fatigue. No BRBPR. Denies CP or SOB. No fever.  Review of Systems: The patient denies anorexia, fever, weight loss,, vision loss, decreased hearing, hoarseness, chest pain, syncope, dyspnea on exertion, peripheral edema, balance deficits, hemoptysis, hematochezia, severe indigestion/heartburn, hematuria, incontinence, genital sores, muscle weakness, suspicious skin lesions, transient blindness, difficulty walking, depression, unusual weight change, abnormal bleeding, enlarged lymph nodes, angioedema, and breast masses.   Past Medical History  Diagnosis Date  . HTN (hypertension)   . Brain lesion   . 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 History of radiation therapy   . Adenocarcinoma of lung ([place]) 12/05/2014   Past Surgical History  Procedure Laterality Date  . Laminectomy N/A 03/07/2014    Procedure: T1-T2 Laminectomy with Decompression of Cord and Removal of Cancer  thoracic one/two;  Surgeon: Kristeen Miss, MD;  Location: [place] [facility];  Service: Neurosurgery;  Laterality: N/A;  . Ct guided biopsy  ([facility] hx)  05/28/2014   Social History:  reports that he has never smoked. He has never used smokeless tobacco. He reports that he drinks about 0.5 oz of alcohol per week. He reports that he does not use illicit drugs.  No Known Allergies  Family History  Problem Relation Age of Onset  . Colon cancer Mother   . Heart disease Father    . Hypertension Sister   . Hypertension Brother     Prior to Admission medications   Medication Sig Start Date End Date Taking? Authorizing Provider  dexamethasone (DECADRON) 4 MG tablet Take 1 tablet (4 mg total) by mouth 2 (two) times daily. 09/21/15  Yes Cammie Sickle, MD  metoprolol succinate (TOPROL-XL) 50 MG 24 hr tablet Take 1 tablet (50 mg total) by mouth daily. 09/17/15  Yes Cammie Sickle, MD  osimertinib mesylate (TAGRISSO) 80 MG tablet Take 1 tablet (80 mg total) by mouth daily. With or without food. 06/25/15  Yes Cammie Sickle, MD  pantoprazole (PROTONIX) 40 MG tablet Take 1 tablet (40 mg total) by mouth 2 (two) times daily. Patient taking differently: Take 40 mg by mouth 2 (two) times daily as needed (for stomach issues.).  09/12/15  Yes Dustin Flock, MD  diphenoxylate-atropine (LOMOTIL) 2.5-0.025 MG tablet Take 1 tablet by mouth 4 (four) times daily as needed for diarrhea or loose stools (take it along with imoidum for diarrhea). 07/26/15   Cammie Sickle, MD  hydrochlorothiazide (HYDRODIURIL) 25 MG tablet Take 1 tablet (25 mg total) by mouth daily. 09/17/15   Cammie Sickle, MD  levETIRAcetam (KEPPRA) 500 MG tablet Take 1 tablet (500 mg total) by mouth 2 (two) times daily. Patient not taking: Reported on 09/13/2015 09/12/15   Dustin Flock, MD  oxyCODONE (OXY IR/ROXICODONE) 5 MG immediate release tablet Take 1 tablet (5 mg total) by mouth every 4 (four) hours as needed for moderate pain or severe pain. 09/12/15   Dustin Flock, MD  potassium chloride (K-DUR,KLOR-CON) 10 MEQ tablet  Take 1 tablet (10 mEq total) by mouth 2 (two) times daily. Patient not taking: Reported on 09/13/2015 05/01/15   Evlyn Kanner, NP  prochlorperazine (COMPAZINE) 10 MG tablet Take 1 tablet (10 mg total) by mouth every 6 (six) hours as needed for nausea or vomiting. 06/25/15   Cammie Sickle, MD   Physical Exam: Filed Vitals:   09/30/15 1409 09/30/15 1430 09/30/15 1500  09/30/15 1530  BP: 103/73 96/73 104/68 102/74  Pulse: 79 75 69 65  Temp: 98 F (36.7 C)     TempSrc: Oral     Resp: '18 17  16  '$ Height: '5\' 7"'$  (1.702 m)     Weight: 61.236 kg (135 lb)     SpO2: 100% 100% 100% 100%     General:  WDWN but acutely ill appearing in moderate distress, [place]/AT  Eyes: PERRL, EOMI, no scleral icterus, conjunctiva pale  ENT: moist oropharynx without lesions or exudate, TM's benign, dentition fair  Neck: supple, no lymphadenopathy. No bruits or thyromegaly  Cardiovascular: regular rate without MRG; 2+ peripheral pulses, no JVD, no peripheral edema  Respiratory: scattered rhonchi without wheezes or rales. No dullness. Respiratory effort normal  Abdomen: soft, tender to palpation in the epigastrum, positive bowel sounds, no guarding, no rebound, no organomegaly  Skin: no rashes or lesions  Musculoskeletal: normal bulk and tone, no joint swelling  Psychiatric: normal mood and affect, A$OX3  Neurologic: CN 2-12 grossly intact, Motor strength 5/5 in all 4 groups, DTR's symmetric, sensory exam non-focal  Labs on Admission:  Basic Metabolic Panel:  Recent Labs Lab 09/30/15 1413  NA 137  K 4.1  CL 107  CO2 23  GLUCOSE 119*  BUN 47*  CREATININE 0.94  CALCIUM 7.5*   Liver Function Tests:  Recent Labs Lab 09/30/15 1413  AST 27  ALT 27  ALKPHOS 55  BILITOT 0.4  PROT 4.8*  ALBUMIN 2.5*   No results for input(s): LIPASE, AMYLASE in the last 168 hours. No results for input(s): AMMONIA in the last 168 hours. CBC:  Recent Labs Lab 09/30/15 1413  WBC 10.6  NEUTROABS 9.2*  HGB 10.3*  HCT 31.8*  MCV 77.3*  PLT 140*   Cardiac Enzymes: No results for input(s): CKTOTAL, CKMB, CKMBINDEX, TROPONINI in the last 168 hours.  BNP (last 3 results) No results for input(s): BNP in the last 8760 hours.  ProBNP (last 3 results) No results for input(s): PROBNP in the last 8760 hours.  CBG: No results for input(s): GLUCAP in the last 168  hours.  Radiological Exams on Admission: Dg Chest Portable 1 View  09/30/2015  CLINICAL DATA:  Currently being treated for lung CA, vomiting blood today EXAM: PORTABLE CHEST 1 VIEW COMPARISON:  08/20/2015 chest CT FINDINGS: Heart size is normal. Nodule adjacent to the aortic knob is smaller compared with previous imaging studies. Numerous sclerotic expansile osseous metastases are again noted, most prominently involving the right posterior seventh rib. There are no focal consolidations or pleural effusions. No pulmonary edema. IMPRESSION: 1.  No evidence for interval infiltrate. 2. Smaller left upper lobe nodule. 3. Osseous metastatic disease. Electronically Signed   By: Nolon Nations M.D.   On: 09/30/2015 15:07    EKG: Independently reviewed.  Assessment/Plan Principal Problem:   Upper GI bleed Active Problems:   Acute blood loss anemia   Epigastric pain   Metastatic primary lung cancer ([place])   Will admit to ICU with drips. Follow hgb closely and transfuse as need. Consult GI, Oncology, and  PC. Repeat labs in AM.  Diet: clear liquids Fluids: NS'@100'$  DVT Prophylaxis: none  Code Status: FULL  Family Communication: none  Disposition Plan: home  Time spent: 55 min

## 2015-09-30 NOTE — ED Notes (Signed)
Called to see if bed was ready. Told, "We are negotiating. The bed hasn't been approved. Give [place] a little bit".

## 2015-09-30 NOTE — Progress Notes (Signed)
[place] Progress Note Patient Name: Luis [name] DOB: 1966/06/07 MRN: [id number]   Date of Service  09/30/2015  HPI/Events of Note  PT admitted with UGIB and is hemodynamically stable  eICU Interventions  No eICU interventions warranted. Cont to monitor      Intervention Category Evaluation Type: New Patient Evaluation  Asencion Noble 09/30/2015, 7:53 PM

## 2015-09-30 NOTE — ED Notes (Signed)
Pharmacy called and notified of need for protonix and sandostatin. States they will send it up. Patient made aware.

## 2015-10-01 ENCOUNTER — Encounter
Admission: EM | Disposition: A | Discharge status: Home / Self Care | Payer: Self-pay | Source: Home / Self Care | Attending: Internal Medicine

## 2015-10-01 ENCOUNTER — Ambulatory Visit: Payer: BLUE CROSS/BLUE SHIELD | Admitting: Internal Medicine

## 2015-10-01 ENCOUNTER — Other Ambulatory Visit: Payer: Self-pay | Admitting: *Deleted

## 2015-10-01 ENCOUNTER — Inpatient Hospital Stay: Payer: BLUE CROSS/BLUE SHIELD | Admitting: *Deleted

## 2015-10-01 ENCOUNTER — Encounter: Payer: Self-pay | Admitting: Anesthesiology

## 2015-10-01 ENCOUNTER — Ambulatory Visit: Payer: BLUE CROSS/BLUE SHIELD

## 2015-10-01 DIAGNOSIS — C7802 Secondary malignant neoplasm of left lung: Secondary | ICD-10-CM

## 2015-10-01 DIAGNOSIS — Z923 Personal history of irradiation: Secondary | ICD-10-CM

## 2015-10-01 DIAGNOSIS — R490 Dysphonia: Secondary | ICD-10-CM

## 2015-10-01 DIAGNOSIS — Z7952 Long term (current) use of systemic steroids: Secondary | ICD-10-CM

## 2015-10-01 DIAGNOSIS — C3411 Malignant neoplasm of upper lobe, right bronchus or lung: Secondary | ICD-10-CM

## 2015-10-01 DIAGNOSIS — C7931 Secondary malignant neoplasm of brain: Secondary | ICD-10-CM

## 2015-10-01 DIAGNOSIS — R05 Cough: Secondary | ICD-10-CM

## 2015-10-01 DIAGNOSIS — I10 Essential (primary) hypertension: Secondary | ICD-10-CM

## 2015-10-01 DIAGNOSIS — K920 Hematemesis: Secondary | ICD-10-CM

## 2015-10-01 DIAGNOSIS — R531 Weakness: Secondary | ICD-10-CM

## 2015-10-01 DIAGNOSIS — C7951 Secondary malignant neoplasm of bone: Secondary | ICD-10-CM

## 2015-10-01 DIAGNOSIS — R5383 Other fatigue: Secondary | ICD-10-CM

## 2015-10-01 HISTORY — PX: ESOPHAGOGASTRODUODENOSCOPY: SHX5428

## 2015-10-01 LAB — COMPREHENSIVE METABOLIC PANEL
ALBUMIN: 2 g/dL — AB (ref 3.5–5.0)
ALT: 22 U/L (ref 17–63)
ANION GAP: 2 — AB (ref 5–15)
AST: 20 U/L (ref 15–41)
Alkaline Phosphatase: 53 U/L (ref 38–126)
BUN: 19 mg/dL (ref 6–20)
CO2: 22 mmol/L (ref 22–32)
Calcium: 6.2 mg/dL — CL (ref 8.9–10.3)
Chloride: 109 mmol/L (ref 101–111)
Creatinine, Ser: 0.63 mg/dL (ref 0.61–1.24)
GFR calc non Af Amer: >60 mL/min (ref >60)
GLUCOSE: 124 mg/dL — AB (ref 65–99)
POTASSIUM: 3.8 mmol/L (ref 3.5–5.1)
SODIUM: 133 mmol/L — AB (ref 135–145)
TOTAL PROTEIN: 3.9 g/dL — AB (ref 6.5–8.1)
Total Bilirubin: 0.8 mg/dL (ref 0.3–1.2)

## 2015-10-01 LAB — CBC
HEMATOCRIT: 26.3 % — AB (ref 40.0–52.0)
HEMOGLOBIN: 8.4 g/dL — AB (ref 13.0–18.0)
MCH: 24.8 pg — ABNORMAL LOW (ref 26.0–34.0)
MCHC: 31.9 g/dL — AB (ref 32.0–36.0)
MCV: 77.8 fL — ABNORMAL LOW (ref 80.0–100.0)
Platelets: 107 10*3/uL — ABNORMAL LOW (ref 150–440)
RBC: 3.39 MIL/uL — AB (ref 4.40–5.90)
RDW: 22.2 % — ABNORMAL HIGH (ref 11.5–14.5)
WBC: 8.5 10*3/uL (ref 3.8–10.6)

## 2015-10-01 LAB — ABO/RH: ABO/RH(D): O POS

## 2015-10-01 LAB — PROTIME-INR
INR: 1.13
PROTHROMBIN TIME: 14.7 s (ref 11.4–15.0)

## 2015-10-01 LAB — GLUCOSE, CAPILLARY: GLUCOSE-CAPILLARY: 79 mg/dL (ref 65–99)

## 2015-10-01 SURGERY — EGD (ESOPHAGOGASTRODUODENOSCOPY)
Anesthesia: General

## 2015-10-01 MED ORDER — PROPOFOL 10 MG/ML IV BOLUS
INTRAVENOUS | Status: DC | PRN
  Administered 2015-10-01 (×2): 10 mg via INTRAVENOUS

## 2015-10-01 MED ORDER — KETAMINE HCL 10 MG/ML IJ SOLN
INTRAMUSCULAR | Status: DC | PRN
  Administered 2015-10-01 (×3): 5 mg via INTRAVENOUS

## 2015-10-01 MED ORDER — SODIUM CHLORIDE 0.9 % IV SOLN
1.0000 g | Freq: Once | INTRAVENOUS | Status: AC
  Administered 2015-10-01: 1 g via INTRAVENOUS
  Filled 2015-10-01: qty 10

## 2015-10-01 MED ORDER — SODIUM CHLORIDE 0.9 % IV SOLN
INTRAVENOUS | Status: DC | PRN
  Administered 2015-10-01: 15:00:00 via INTRAVENOUS

## 2015-10-01 NOTE — Progress Notes (Signed)
[facility]                                                                                                                                                                                            Patient Demographics   Luis [name], is a 50 y.o. male, DOB - 04-12-1966, TLX:[id number]  Admit date - 09/30/2015   Admitting Physician Idelle Crouch, MD  Outpatient Primary MD for the patient is PROVIDER NOT IN SYSTEM   LOS - 1  Subjective: just returned from EGD, sleepy, no complaints Review of Systems:   CONSTITUTIONAL: No documented fever. No fatigue, weakness. No weight gain, no weight loss.  EYES: No blurry or double vision.  ENT: No tinnitus. No postnasal drip. No redness of the oropharynx.  RESPIRATORY: No cough, no wheeze, no hemoptysis. No dyspnea.  CARDIOVASCULAR: No chest pain. No orthopnea. No palpitations. No syncope.  GASTROINTESTINAL: No nausea, no vomiting or diarrhea. No abdominal pain. No melena or hematochezia.  GENITOURINARY: No dysuria or hematuria.  ENDOCRINE: No polyuria or nocturia. No heat or cold intolerance.  HEMATOLOGY: No anemia. No bruising. No bleeding.  INTEGUMENTARY: No rashes. No lesions.  MUSCULOSKELETAL: No arthritis. No swelling. No gout.  NEUROLOGIC: Left hand arm numbness and weakness PSYCHIATRIC: No anxiety. No insomnia. No ADD.    Vitals:   Filed Vitals:   10/01/15 1540 10/01/15 1550 10/01/15 1600 10/01/15 1610  BP: 106/64 114/74 116/84 105/77  Pulse: 71 72 74 71  Temp: 97.2 F (36.2 C)     TempSrc: Tympanic     Resp: '29 26 22 26  '$ Height:      Weight:      SpO2: 100% 100% 100% 100%    Wt Readings from Last 3 Encounters:  10/01/15 61.3 kg (135 lb 2.3 oz)  09/28/15 59 kg (130 lb 1.1 oz)  09/13/15 59.7 kg (131 lb 9.8 oz)     Intake/Output Summary (Last 24 hours) at 10/01/15 1718 Last data filed at 10/01/15 1600  Gross per 24 hour  Intake   1810 ml  Output   3900 ml  Net  -2090 ml     Physical Exam:   GENERAL: Pleasant-appearing in no apparent distress.  HEAD, EYES, EARS, NOSE AND THROAT: Atraumatic, normocephalic. Extraocular muscles are intact. Pupils equal and reactive to light. Sclerae anicteric. No conjunctival injection. No oro-pharyngeal erythema.  NECK: Supple. There is no jugular venous distention. No bruits, no lymphadenopathy, no thyromegaly.  HEART: Regular rate and rhythm,. No murmurs, no rubs, no clicks.  LUNGS: Clear to auscultation bilaterally. No rales or rhonchi. No wheezes.  ABDOMEN: Soft, flat, nontender, nondistended. Has good bowel sounds. No hepatosplenomegaly appreciated.  EXTREMITIES: No evidence of any cyanosis, clubbing, or peripheral edema.  +2 pedal and radial pulses bilaterally.  NEUROLOGIC: The patient is alert, awake, and oriented x3 left arm weakness SKIN: Moist and warm with no rashes appreciated.  Psych: Not anxious, depressed LN: No inguinal LN enlargement    Antibiotics   Anti-infectives    None      Medications   Scheduled Meds: . [MAR Hold] dexamethasone  4 mg Oral BID  . [MAR Hold] docusate sodium  100 mg Oral BID  . [MAR Hold] levETIRAcetam  500 mg Oral BID  . [MAR Hold] metoprolol succinate  50 mg Oral Daily  . [MAR Hold] osimertinib mesylate  80 mg Oral Daily   Continuous Infusions: . sodium chloride 100 mL/hr at 10/01/15 1129  . octreotide  (SANDOSTATIN)    IV infusion 50 mcg/hr (10/01/15 1400)  . pantoprozole (PROTONIX) infusion 8 mg/hr (10/01/15 1400)   PRN Meds:.[MAR Hold] acetaminophen **OR** [MAR Hold] acetaminophen, [MAR Hold] bisacodyl, [MAR Hold]  morphine injection, [MAR Hold] ondansetron **OR** [MAR Hold] ondansetron (ZOFRAN) IV   Data Review:   Micro Results Recent Results (from the past 240 hour(s))  MRSA PCR Screening     Status: None   Collection Time: 09/30/15  8:02 PM  Result Value Ref Range Status   MRSA by PCR NEGATIVE NEGATIVE Final    Comment:        The GeneXpert MRSA Assay  (FDA approved for NASAL specimens only), is one component of a comprehensive MRSA colonization surveillance program. It is not intended to diagnose MRSA infection nor to guide or monitor treatment for MRSA infections.     Radiology Reports Ct Head Wo Contrast  09/10/2015  CLINICAL DATA:  Left hand numbness for 1 week. History of brain and lung cancer. EXAM: CT HEAD WITHOUT CONTRAST TECHNIQUE: Contiguous axial images were obtained from the base of the skull through the vertex without intravenous contrast. COMPARISON:  MRI of August 23, 2015. FINDINGS: 29 x 17 mm sclerotic density is seen superiorly in left frontal skull which may represent metastatic lesion. Ventricular size is within normal limits. No midline shift is noted. Minimal diffuse cortical atrophy is noted. Mild chronic ischemic white matter disease is noted. Ill-defined low density is again noted in right cerebellar hemisphere consistent with metastatic disease. No definite hemorrhage is noted. Other metastatic lesions seen on prior MRI are not well visualized on this unenhanced study. IMPRESSION: 2.9 cm sclerotic lesions seen in left frontal skull which may represent metastatic lesion. Ill-defined low density is noted in the right cerebellar hemisphere consistent with metastatic lesion and surrounding edema noted on prior MRI. Electronically Signed   By: Marijo Conception, M.D.   On: 09/10/2015 11:50   Mr Jeri Cos IW Contrast  09/11/2015  CLINICAL DATA:  50 year old hypertensive male with lung cancer and known metastatic brain lesions. Worsening left upper extremity weakness. Subsequent encounter. EXAM: MRI HEAD WITHOUT AND WITH CONTRAST TECHNIQUE: Multiplanar, multiecho pulse sequences of the brain and surrounding structures were obtained without and with intravenous contrast. CONTRAST:  3m MULTIHANCE GADOBENATE DIMEGLUMINE 529 MG/ML IV SOLN COMPARISON:  09/03/2015 head CT. 08/23/2015, 05/24/2015 in 02/2019 01/30/2014 brain MR.  FINDINGS: When compared to examination earlier this month, multiple intracranial metastatic lesions (supratentorial and infratentorial) appear the same or slightly smaller. Largest metastatic lesion posterior right cerebellum measures 2 x 1.6 x 1.6 cm versus prior 2.2 x 1.7 x 1.6 cm.  Surrounding vasogenic edema. No new intracranial metastatic lesion detected. Similar appearance of left superior convexity calvarial metastatic lesion with mild bony expansion spanning over 2.8 x 2.7 cm. No acute infarct. Some of the cerebellum metastatic lesions are partially hemorrhagic (without change) otherwise no intracranial hemorrhage noted. Global atrophy without hydrocephalus. Opacification left mastoid air cells and middle ear cavity. Similar appearance previously. No obstructing lesion of eustachian tube noted. Paranasal sinus mucosal thickening most notable left maxillary sinus with polypoid opacification inferior aspect. No orbital abnormality detected. Shallow sella.  Cervical medullary junction within normal limits. Major intracranial vascular structures are patent. IMPRESSION: When compared to examination earlier this month, multiple intracranial metastatic lesions (supratentorial and infratentorial) appear the same or slightly smaller. Largest metastatic lesion posterior right cerebellum measures 2 x 1.6 x 1.6 cm versus prior 2.2 x 1.7 x 1.6 cm. Surrounding vasogenic edema. No new intracranial metastatic lesion detected. Similar appearance of left superior convexity calvarial metastatic lesion with mild bony expansion spanning over 2.8 x 2.7 cm. No acute infarct. Global atrophy. Opacification left mastoid air cells and middle ear cavity. Similar appearance previously. No obstructing lesion of eustachian tube noted. Paranasal sinus mucosal thickening most notable left maxillary sinus with polypoid opacification inferior aspect. Electronically Signed   By: Genia Del M.D.   On: 09/11/2015 11:19   Mr Cervical Spine W  Wo Contrast  09/14/2015  CLINICAL DATA:  Metastatic lung cancer with brain involvement and metastatic disease to the C7 vertebral body. Left upper extremity weakness and numbness. EXAM: MRI CERVICAL SPINE WITHOUT AND WITH CONTRAST TECHNIQUE: Multiplanar and multiecho pulse sequences of the cervical spine, to include the craniocervical junction and cervicothoracic junction, were obtained according to standard protocol without and with intravenous contrast. CONTRAST:  21m MULTIHANCE GADOBENATE DIMEGLUMINE 529 MG/ML IV SOLN COMPARISON:  09/11/2015 brain MRI ; thoracic spine MRI from 09/12/2015 FINDINGS: Compression at T2 with posterior bony retropulsion not changed from prior. Patient is undergone upper thoracic radiotherapy. This region was discussed in the recent thoracic spine MRI. No significant abnormal spinal cord signal is observed. The craniocervical junction appears unremarkable. Is abnormal enhancement and edema signal throughout the left side of the T1 vertebral body involving the pedicles, and facets. There is also abnormal edema and enhancement in the C7 spinous process. Smaller focal lesions noted in the right T1 facet region and eccentric to the left in the T1 vertebral body. There is epidural tumor along the left anterior margin of the thecal sac primarily extending from the C6-7 level down to the upper T1 level, measuring about 7 mm in thickness and extending through an filling much of the left neural foramen, encasing the left C8 nerve and likely with some mild extension in the inferior portion of the left C6-7 neural foramen to involve the left C7 nerve. No cord invasion. No other regions of epidural tumor noted in the cervical spine. Indistinct densities posteriorly in the left upper lobe are partially included on today's exam. There is expansion of the medial left second rib and adjacent left T2 transverse process. Additional findings at individual levels are as follows: C2-3:  Unremarkable. C3-4:   No impingement.  Small central disc protrusion. C4-5: Mild central narrowing of the thecal sac due to central disc protrusion. C5-6: Moderate right and mild left foraminal stenosis due to disc bulge and uncinate spurring. C6-7: Mild left foraminal stenosis due to uncinate and facet spurring. Suspected epidural enhancement extending in the inferior portion of the left neural foramen partially surrounding the left  C7 nerve. Small central disc protrusion with mild central narrowing of the thecal sac. C7-T1: Considerable epidural tumor extending in the left neural foramen and at least partially encasing the left C8 nerve. T1-2: Left foraminal stenosis due to chronic tumor expansion of the superior articular facet of T2 IMPRESSION: 1. Epidural tumor along the left anterior margin of the thecal sac extending from C6-7 down to the upper T1 level, extending primarily into the left neural foramen at C7-T1 and to a lesser extent into the inferior margin of the left neural foramen at C6-7, potentially interfering with the left C7 and C8 nerves. 2. As noted on recent thoracic spine MRI, the tumor expansion of the superior articular facet of T2 causes left foraminal stenosis at T1-2. 3. Otherwise, cervical spondylosis and degenerative disc disease cause moderate impingement at C5-6 and mild impingement at C4-5 and C6-7, as detailed above. 4. Enhancing tumor in the C7 vertebral body especially eccentric to the left, and extending to involve the pedicles and facets. There is potentially a focus of tumor in the spinous process of C7 and there is edema and enhancement in the left C6 inferior articular facet region possibly due to tumor. Electronically Signed   By: Van Clines M.D.   On: 09/14/2015 14:05   Mr Thoracic Spine W Wo Contrast  09/12/2015  CLINICAL DATA:  Weakness of the left upper extremity.  Lung cancer EXAM: MRI THORACIC SPINE WITHOUT AND WITH CONTRAST TECHNIQUE: Multiplanar and multiecho pulse sequences of  the thoracic spine were obtained without and with intravenous contrast. CONTRAST:  47m MULTIHANCE GADOBENATE DIMEGLUMINE 529 MG/ML IV SOLN COMPARISON:  03/07/2014 FINDINGS: Sagittal counting sequence of the cervical spine shows a C7 metastasis which involves the entire visualized vertebral and infiltrates the left C7-T1 and C6-7 foramina. Patient is undergone T1-T5 radiotherapy of previously bulky metastatic disease. There has also been a T1 decompressive laminectomy. Spinal cord compression has resolved with no residual signal abnormality. Tumor at this level is nonenhancing compared to the other levels. Aside from the T8 and T9 levels there is metastatic disease within all of the thoracic vertebrae. There are chronic compression fractures at T2 and T5 with height loss greater than 50%. Height loss has progressed since 2015 comparison. Early ventral epidural tumor growth at T11, without cord compromise. Chronic T1-2 and T2-3 left foraminal stenosis from metastatic expansion and distortion of the posterior elements. IMPRESSION: 1. Large C7 metastasis with left C6-7 and C7-T1 foraminal infiltration that could certainly explain the left upper extremity symptoms. Recommend dedicated cervical spine imaging. 2. T1 through T5 radiotherapy with good response compared to 2015. There is no residual cord compression and the metastases are nonenhancing. 3. Notable T11 metastasis with early ventral epidural infiltration. Electronically Signed   By: JMonte FantasiaM.D.   On: 09/12/2015 10:35   Dg Chest Portable 1 View  09/30/2015  CLINICAL DATA:  Currently being treated for lung CA, vomiting blood today EXAM: PORTABLE CHEST 1 VIEW COMPARISON:  08/20/2015 chest CT FINDINGS: Heart size is normal. Nodule adjacent to the aortic knob is smaller compared with previous imaging studies. Numerous sclerotic expansile osseous metastases are again noted, most prominently involving the right posterior seventh rib. There are no focal  consolidations or pleural effusions. No pulmonary edema. IMPRESSION: 1.  No evidence for interval infiltrate. 2. Smaller left upper lobe nodule. 3. Osseous metastatic disease. Electronically Signed   By: ENolon NationsM.D.   On: 09/30/2015 15:07     CBC  Recent Labs Lab  09/30/15 1413 09/30/15 2115 10/01/15 0501  WBC 10.6  --  8.5  HGB 10.3* 9.2* 8.4*  HCT 31.8*  --  26.3*  PLT 140*  --  107*  MCV 77.3*  --  77.8*  MCH 25.0*  --  24.8*  MCHC 32.4  --  31.9*  RDW 21.7*  --  22.2*  LYMPHSABS 0.7*  --   --   MONOABS 0.6  --   --   EOSABS 0.1  --   --   BASOSABS 0.1  --   --     Chemistries   Recent Labs Lab 09/30/15 1413 10/01/15 0501  NA 137 133*  K 4.1 3.8  CL 107 109  CO2 23 22  GLUCOSE 119* 124*  BUN 47* 19  CREATININE 0.94 0.63  CALCIUM 7.5* 6.2*  AST 27 20  ALT 27 22  ALKPHOS 55 53  BILITOT 0.4 0.8      Assessment & Plan   * Upper GI bleed: S/P EGD earlier today by GI - Multiple gastric ulcers, with 1 large ulcer with adherent clot at incisura which was cauterized. Resume clears. Advance slowly as tolerated.Can stop octreotide by tomorrow. Will need protonix at least bid for next 6-8 weeks per GI  * Acute blood loss anemia: Hb 10.3 -> 8.4, will monitor, likely due to Multiple gastric ulcers, with 1 large ulcer with adherent clot at incisura. Monitor Hb   * Epigastric pain: much better now.  * Metastatic primary lung cancer ([place]) with Brain metastases and bone metastases: f/by onco as an outpt  * Thrombocytopenia: platelets dropping 140 -> 107. Avoid antiplatelets      Code Status Orders        Start     Ordered   09/10/15 1358  Full code   Continuous     09/10/15 1358    Code Status History    Date Active Date Inactive Code Status Order ID Comments User Context   05/29/2015 10:13 AM 05/30/2015  3:29 AM Full Code [id number]  Inez Catalina, MD [place]   03/07/2014  7:04 PM 03/11/2014  6:11 PM Full Code [id number]  Kristeen Miss, MD Inpatient    03/06/2014 10:45 PM 03/07/2014  7:04 PM Full Code [id number]  Shanda Howells, MD Inpatient           Consults  oncology   Lab Results  Component Value Date   PLT 107* 10/01/2015     Time Spent in minutes   55mn   Greater than 50% of time spent in care coordination and counseling patient regarding the condition and plan of care.   [facility] Jhordyn Hoopingarner M.D on 10/01/2015 at 5:18 PM  Between 7am to 6pm - Pager - [phone]  After 6pm go to [email or web address] - password [email or web address]   Office  [phone]

## 2015-10-01 NOTE — Progress Notes (Signed)
Pt has had no complaints of pain on my shift. No complaints of N&V. Vital signs have remained stable entire shift.  Pt eating clear liquid diet currently.  Family at bedside. Pt has slept most of the day today.   Pt remains A&O x4, with clear lung sounds on room air.

## 2015-10-01 NOTE — Consult Note (Signed)
[facility] CONSULT NOTE  Patient Care Team: Provider Not In System as PCP - General  CHIEF COMPLAINTS/PURPOSE OF CONSULTATION:  Lung cancer/hematemesis  HISTORY OF PRESENTING ILLNESS:  Luis [name] 50 y.o.  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   Patient noted to have vomiting blood- bright and dark about 3 episodes last night. Denies any abdominal pain. Otherwise denies any blood in stools. Denies any syncopal episodes. Patient has been on steroids 4 mg of dexamethasone- at various doses- most recently 4 twice a day- for his left upper extremity weakness.  Denies any seizure-like activity. Denies any double vision. Denies any worsening headaches. Currently has intermittent cough; runny nose hoarse voice. Denies any unusual shortness of breath or chest pain.  Complains of chronic fatigue.  ROS: A complete 10 point review of system is done which is negative except mentioned above in history of present illness  MEDICAL HISTORY:  Past Medical History  Diagnosis Date  . HTN (hypertension)   . Brain lesion   . 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 History of radiation therapy   . Adenocarcinoma of lung ([place]) 12/05/2014    SURGICAL HISTORY: Past Surgical History  Procedure Laterality Date  . Laminectomy N/A 03/07/2014    Procedure: T1-T2 Laminectomy with Decompression of Cord and Removal of Cancer  thoracic one/two;  Surgeon: Kristeen Miss, MD;  Location: [facility];  Service: Neurosurgery;  Laterality: N/A;  . Ct guided biopsy  ([facility] hx)  05/28/2014    SOCIAL HISTORY: Social History   Social History  . Marital Status: Married    Spouse Name: N/A  . Number of Children: N/A  . Years of Education: N/A   Occupational  History  . Not on file.   Social History Main Topics  . Smoking status: Never Smoker   . Smokeless tobacco: Never Used  . Alcohol Use: 0.5 oz/week    1 Standard drinks or equivalent per week     Comment: none for a year  . Drug Use: No  . Sexual Activity: Yes   Other Topics Concern  . Not on file   Social History Narrative    FAMILY HISTORY: Family History  Problem Relation Age of Onset  . Colon cancer Mother   . Heart disease Father   . Hypertension Sister   . Hypertension Brother     ALLERGIES:  has No Known Allergies.  MEDICATIONS:  Current Facility-Administered Medications  Medication Dose Route Frequency Provider Last Rate Last Dose  . 0.9 %  sodium chloride infusion   Intravenous Continuous Idelle Crouch, MD 100 mL/hr at 10/01/15 1129    . acetaminophen (TYLENOL) tablet 650 mg  650 mg Oral Q6H PRN Idelle Crouch, MD       Or  . acetaminophen (TYLENOL) suppository 650 mg  650 mg Rectal Q6H PRN Idelle Crouch, MD      . bisacodyl (DULCOLAX) suppository 10 mg  10 mg Rectal Daily PRN Idelle Crouch, MD      . dexamethasone (DECADRON) tablet 4 mg  4 mg Oral BID Idelle Crouch, MD   4 mg at 10/01/15 1000  . docusate sodium (COLACE) capsule 100 mg  100 mg Oral BID Idelle Crouch, MD   100 mg at 10/01/15  2706  . levETIRAcetam (KEPPRA) tablet 500 mg  500 mg Oral BID Idelle Crouch, MD   500 mg at 10/01/15 0944  . metoprolol succinate (TOPROL-XL) 24 hr tablet 50 mg  50 mg Oral Daily Idelle Crouch, MD   50 mg at 10/01/15 0944  . morphine 2 MG/ML injection 2 mg  2 mg Intravenous Q2H PRN Idelle Crouch, MD      . octreotide (SANDOSTATIN) 500 mcg in sodium chloride 0.9 % 250 mL (2 mcg/mL) infusion  50 mcg/hr Intravenous Continuous Harvest Dark, MD 25 mL/hr at 10/01/15 1200 50 mcg/hr at 10/01/15 1200  . ondansetron (ZOFRAN) tablet 4 mg  4 mg Oral Q6H PRN Idelle Crouch, MD       Or  . ondansetron [facility]) injection 4 mg  4 mg Intravenous Q6H PRN Idelle Crouch, MD      . osimertinib mesylate (TAGRISSO) tablet 80 mg  80 mg Oral Daily Idelle Crouch, MD   80 mg at 09/30/15 2157  . pantoprazole (PROTONIX) 80 mg in sodium chloride 0.9 % 250 mL (0.32 mg/mL) infusion  8 mg/hr Intravenous Continuous Harvest Dark, MD 25 mL/hr at 10/01/15 1200 8 mg/hr at 10/01/15 1200      .  PHYSICAL EXAMINATION:   Filed Vitals:   10/01/15 1100 10/01/15 1200  BP: 110/76 101/77  Pulse: 84 76  Temp:  98.4 F (36.9 C)  Resp: 13 19   Filed Weights   09/30/15 1409 09/30/15 1930 10/01/15 0452  Weight: 135 lb (61.236 kg) 130 lb 4.7 oz (59.1 kg) 135 lb 2.3 oz (61.3 kg)    GENERAL: Well-nourished well-developed; Alert, no distress and comfortable.He is alone. EYES: no pallor or icterus OROPHARYNX: no thrush or ulceration; good dentition  NECK: supple, no masses felt LYMPH: no palpable lymphadenopathy in the cervical, axillary or inguinal regions LUNGS: clear to auscultation and No wheeze or crackles HEART/CVS: regular rate & rhythm and no murmurs; No lower extremity edema ABDOMEN:abdomen soft, non-tender and normal bowel sounds Musculoskeletal:no cyanosis of digits and no clubbing  PSYCH: alert & oriented x 3; slow speech. NEURO: no focal motor/sensory deficits;  SKIN: no rashes or significant lesions LABORATORY DATA:  I have reviewed the data as listed Lab Results  Component Value Date   WBC 8.5 10/01/2015   HGB 8.4* 10/01/2015   HCT 26.3* 10/01/2015   MCV 77.8* 10/01/2015   PLT 107* 10/01/2015    Recent Labs  02/28/15 1445  06/11/15 1531 06/19/15 1409  08/23/15 1325  09/12/15 0454 09/30/15 1413 10/01/15 0501  NA 135  < >  --   --   < > 139  < > 136 137 133*  K 3.2*  < >  --   --   < > 3.1*  < > 4.4 4.1 3.8  CL 104  < >  --   --   < > 102  < > 104 107 109  CO2 25  < >  --   --   < > 27  < > _0 GLUCOSE 133*  < >  --   --   < > 100*  < > 133* 119* 124*  BUN 12  < >  --   --   < > 19  < > 22* 47* 19  CREATININE 1.01   < >  --   --   < > 0.80  < > 0.88 0.94 0.63  CALCIUM 8.3*  < >  --   --   < >  8.9  < > 8.4* 7.5* 6.2*  GFRNONAA >60  < >  --   --   < > >60  < > >60 >60 >60  GFRAA >60  < >  --   --   < > >60  < > >60 >60 >60  PROT 8.0  < > 8.7* 8.1  < > 7.7  --   --  4.8* 3.9*  ALBUMIN 3.9  < > 3.3* 3.2*  < > 3.1*  --   --  2.5* 2.0*  AST 46*  < > 183* 29  < > 33  --   --  27 20  ALT 64*  < > 225* 69*  < > 52  --   --  27 22  ALKPHOS 79  < > 222* 163*  < > 137*  --   --  55 53  BILITOT 0.5  < > 0.9 0.3  < > 0.4  --   --  0.4 0.8  BILIDIR <0.1*  --  0.3 <0.1*  --   --   --   --   --   --   IBILI NOT CALCULATED  --  0.6 NOT CALCULATED  --   --   --   --   --   --   < > = values in this interval not displayed.  RADIOGRAPHIC STUDIES: I have personally reviewed the radiological images as listed and agreed with the findings in the report. Ct Head Wo Contrast  09/10/2015  CLINICAL DATA:  Left hand numbness for 1 week. History of brain and lung cancer. EXAM: CT HEAD WITHOUT CONTRAST TECHNIQUE: Contiguous axial images were obtained from the base of the skull through the vertex without intravenous contrast. COMPARISON:  MRI of August 23, 2015. FINDINGS: 29 x 17 mm sclerotic density is seen superiorly in left frontal skull which may represent metastatic lesion. Ventricular size is within normal limits. No midline shift is noted. Minimal diffuse cortical atrophy is noted. Mild chronic ischemic white matter disease is noted. Ill-defined low density is again noted in right cerebellar hemisphere consistent with metastatic disease. No definite hemorrhage is noted. Other metastatic lesions seen on prior MRI are not well visualized on this unenhanced study. IMPRESSION: 2.9 cm sclerotic lesions seen in left frontal skull which may represent metastatic lesion. Ill-defined low density is noted in the right cerebellar hemisphere consistent with metastatic lesion and surrounding edema noted on prior MRI. Electronically Signed   By:  Marijo Conception, M.D.   On: 09/10/2015 11:50   Mr Jeri Cos JS Contrast  09/11/2015  CLINICAL DATA:  50 year old hypertensive [name] with lung cancer and known metastatic brain lesions. Worsening left upper extremity weakness. Subsequent encounter. EXAM: MRI HEAD WITHOUT AND WITH CONTRAST TECHNIQUE: Multiplanar, multiecho pulse sequences of the brain and surrounding structures were obtained without and with intravenous contrast. CONTRAST:  49m MULTIHANCE GADOBENATE DIMEGLUMINE 529 MG/ML IV SOLN COMPARISON:  09/03/2015 head CT. 08/23/2015, 05/24/2015 in 02/2019 01/30/2014 brain MR. FINDINGS: When compared to examination earlier this month, multiple intracranial metastatic lesions (supratentorial and infratentorial) appear the same or slightly smaller. Largest metastatic lesion posterior right cerebellum measures 2 x 1.6 x 1.6 cm versus prior 2.2 x 1.7 x 1.6 cm. Surrounding vasogenic edema. No new intracranial metastatic lesion detected. Similar appearance of left superior convexity calvarial metastatic lesion with mild bony expansion spanning over 2.8 x 2.7 cm. No acute infarct. Some of the cerebellum metastatic lesions are partially hemorrhagic (without change) otherwise  no intracranial hemorrhage noted. Global atrophy without hydrocephalus. Opacification left mastoid air cells and middle ear cavity. Similar appearance previously. No obstructing lesion of eustachian tube noted. Paranasal sinus mucosal thickening most notable left maxillary sinus with polypoid opacification inferior aspect. No orbital abnormality detected. Shallow sella.  Cervical medullary junction within normal limits. Major intracranial vascular structures are patent. IMPRESSION: When compared to examination earlier this month, multiple intracranial metastatic lesions (supratentorial and infratentorial) appear the same or slightly smaller. Largest metastatic lesion posterior right cerebellum measures 2 x 1.6 x 1.6 cm versus prior 2.2 x 1.7 x 1.6  cm. Surrounding vasogenic edema. No new intracranial metastatic lesion detected. Similar appearance of left superior convexity calvarial metastatic lesion with mild bony expansion spanning over 2.8 x 2.7 cm. No acute infarct. Global atrophy. Opacification left mastoid air cells and middle ear cavity. Similar appearance previously. No obstructing lesion of eustachian tube noted. Paranasal sinus mucosal thickening most notable left maxillary sinus with polypoid opacification inferior aspect. Electronically Signed   By: Genia Del M.D.   On: 09/11/2015 11:19   Mr Cervical Spine W Wo Contrast  09/14/2015  CLINICAL DATA:  Metastatic lung cancer with brain involvement and metastatic disease to the C7 vertebral body. Left upper extremity weakness and numbness. EXAM: MRI CERVICAL SPINE WITHOUT AND WITH CONTRAST TECHNIQUE: Multiplanar and multiecho pulse sequences of the cervical spine, to include the craniocervical junction and cervicothoracic junction, were obtained according to standard protocol without and with intravenous contrast. CONTRAST:  76m MULTIHANCE GADOBENATE DIMEGLUMINE 529 MG/ML IV SOLN COMPARISON:  09/11/2015 brain MRI ; thoracic spine MRI from 09/12/2015 FINDINGS: Compression at T2 with posterior bony retropulsion not changed from prior. Patient is undergone upper thoracic radiotherapy. This region was discussed in the recent thoracic spine MRI. No significant abnormal spinal cord signal is observed. The craniocervical junction appears unremarkable. Is abnormal enhancement and edema signal throughout the left side of the T1 vertebral body involving the pedicles, and facets. There is also abnormal edema and enhancement in the C7 spinous process. Smaller focal lesions noted in the right T1 facet region and eccentric to the left in the T1 vertebral body. There is epidural tumor along the left anterior margin of the thecal sac primarily extending from the C6-7 level down to the upper T1 level, measuring  about 7 mm in thickness and extending through an filling much of the left neural foramen, encasing the left C8 nerve and likely with some mild extension in the inferior portion of the left C6-7 neural foramen to involve the left C7 nerve. No cord invasion. No other regions of epidural tumor noted in the cervical spine. Indistinct densities posteriorly in the left upper lobe are partially included on today's exam. There is expansion of the medial left second rib and adjacent left T2 transverse process. Additional findings at individual levels are as follows: C2-3:  Unremarkable. C3-4:  No impingement.  Small central disc protrusion. C4-5: Mild central narrowing of the thecal sac due to central disc protrusion. C5-6: Moderate right and mild left foraminal stenosis due to disc bulge and uncinate spurring. C6-7: Mild left foraminal stenosis due to uncinate and facet spurring. Suspected epidural enhancement extending in the inferior portion of the left neural foramen partially surrounding the left C7 nerve. Small central disc protrusion with mild central narrowing of the thecal sac. C7-T1: Considerable epidural tumor extending in the left neural foramen and at least partially encasing the left C8 nerve. T1-2: Left foraminal stenosis due to chronic tumor expansion of  the superior articular facet of T2 IMPRESSION: 1. Epidural tumor along the left anterior margin of the thecal sac extending from C6-7 down to the upper T1 level, extending primarily into the left neural foramen at C7-T1 and to a lesser extent into the inferior margin of the left neural foramen at C6-7, potentially interfering with the left C7 and C8 nerves. 2. As noted on recent thoracic spine MRI, the tumor expansion of the superior articular facet of T2 causes left foraminal stenosis at T1-2. 3. Otherwise, cervical spondylosis and degenerative disc disease cause moderate impingement at C5-6 and mild impingement at C4-5 and C6-7, as detailed above. 4.  Enhancing tumor in the C7 vertebral body especially eccentric to the left, and extending to involve the pedicles and facets. There is potentially a focus of tumor in the spinous process of C7 and there is edema and enhancement in the left C6 inferior articular facet region possibly due to tumor. Electronically Signed   By: Van Clines M.D.   On: 09/14/2015 14:05   Mr Thoracic Spine W Wo Contrast  09/12/2015  CLINICAL DATA:  Weakness of the left upper extremity.  Lung cancer EXAM: MRI THORACIC SPINE WITHOUT AND WITH CONTRAST TECHNIQUE: Multiplanar and multiecho pulse sequences of the thoracic spine were obtained without and with intravenous contrast. CONTRAST:  49m MULTIHANCE GADOBENATE DIMEGLUMINE 529 MG/ML IV SOLN COMPARISON:  03/07/2014 FINDINGS: Sagittal counting sequence of the cervical spine shows a C7 metastasis which involves the entire visualized vertebral and infiltrates the left C7-T1 and C6-7 foramina. Patient is undergone T1-T5 radiotherapy of previously bulky metastatic disease. There has also been a T1 decompressive laminectomy. Spinal cord compression has resolved with no residual signal abnormality. Tumor at this level is nonenhancing compared to the other levels. Aside from the T8 and T9 levels there is metastatic disease within all of the thoracic vertebrae. There are chronic compression fractures at T2 and T5 with height loss greater than 50%. Height loss has progressed since 2015 comparison. Early ventral epidural tumor growth at T11, without cord compromise. Chronic T1-2 and T2-3 left foraminal stenosis from metastatic expansion and distortion of the posterior elements. IMPRESSION: 1. Large C7 metastasis with left C6-7 and C7-T1 foraminal infiltration that could certainly explain the left upper extremity symptoms. Recommend dedicated cervical spine imaging. 2. T1 through T5 radiotherapy with good response compared to 2015. There is no residual cord compression and the metastases are  nonenhancing. 3. Notable T11 metastasis with early ventral epidural infiltration. Electronically Signed   By: JMonte FantasiaM.D.   On: 09/12/2015 10:35   Dg Chest Portable 1 View  09/30/2015  CLINICAL DATA:  Currently being treated for lung CA, vomiting blood today EXAM: PORTABLE CHEST 1 VIEW COMPARISON:  08/20/2015 chest CT FINDINGS: Heart size is normal. Nodule adjacent to the aortic knob is smaller compared with previous imaging studies. Numerous sclerotic expansile osseous metastases are again noted, most prominently involving the right posterior seventh rib. There are no focal consolidations or pleural effusions. No pulmonary edema. IMPRESSION: 1.  No evidence for interval infiltrate. 2. Smaller left upper lobe nodule. 3. Osseous metastatic disease. Electronically Signed   By: ENolon NationsM.D.   On: 09/30/2015 15:07    ASSESSMENT & PLAN:   Metastatic adenocarcinoma of the lung with brain metastases/ cervical spine metastases on radiation and Tagrisso currently admitted to the hospital for hematemesis.  # Hematemesis 3 episodes- currently resolved. Patient's hemoglobin dropped to 8.4 from a baseline around 10-12. Currently on octreotide/IV Protonix. Patient  is awaiting his  EGD this afternoon. Given the history of recent steroid use- steroid due to ulcer is a possibility. If patient has ulceration suspected from steroids- recommend coming off the steroids. Await EGD.   #  Metastatic adenocarcinoma of the lung with EGFR mutation- currently on Tagrisso started since Feb 9th . Tagrisso does have intracranial responses. Continue tagrisso at this time/ pt can use his medication form home.   # Left upper extremity weakness/ secondary to infiltration by the tumor in the cervical region- on radiation currently on hold given the acute issues.  The above plan of care was discussed the patient in detail. All questions were answered.   Thank you Dr. Manuella Ghazi for allowing me to participate in the care of  your pleasant patient. Please do not hesitate to contact me with questions or concerns in the interim.      Cammie Sickle, MD 10/01/2015 1:53 PM

## 2015-10-01 NOTE — Consult Note (Signed)
  Pt seen and examined. Please see C. Celesta Aver' notes. Pt with hematemesis yesterday. None today. NPO since this AM. Will plan EGD this afternoon.

## 2015-10-01 NOTE — Transfer of Care (Signed)
Imme         diate Anesthesia Transfer of Care Note  Patient: Luis [name]  Procedure(s) Performed: Procedure(s): ESOPHAGOGASTRODUODENOSCOPY (EGD) (N/A)  Patient Location: PACU  Anesthesia Type:General  Level of Consciousness: awake, alert  and oriented  Airway & Oxygen Therapy: Patient Spontanous Breathing and Patient connected to nasal cannula oxygen  Post-op Assessment: Report given to RN and Post -op Vital signs reviewed and stable  Post vital signs: Reviewed and stable  Last Vitals:  Filed Vitals:   10/01/15 1431 10/01/15 1437  BP: 106/74   Pulse:    Temp:  37.6 C  Resp: 27     Complications: No apparent anesthesia complications

## 2015-10-01 NOTE — Op Note (Signed)
Multiple gastric ulcers, with 1 large ulcer with adherent clot at incisura. This was cauterized. Resume clears. Advance slowly as tolerated.Can stop octreotide by tomorrow. Will need protonix at least bid for next 6-8 weeks. Thanks.

## 2015-10-01 NOTE — Op Note (Signed)
[facility] Patient Name: Luis [name] Procedure Date: 10/01/2015 3:07 PM MRN: [id number] Account #: [id number] Date of Birth: Jul 22, 1965 Admit Type: Inpatient Age: 50 Room: [facility] ENDO ROOM 4 Gender: Male Note Status: Finalized Procedure:            Upper GI endoscopy Indications:          Hematemesis Providers:            Lupita Dawn. Candace Cruise, MD Referring MD:         Tracie Harrier, MD (Referring MD) Medicines:            Monitored Anesthesia Care Complications:        No immediate complications. Procedure:            Pre-Anesthesia Assessment:                       - Prior to the procedure, a History and Physical was                        performed, and patient medications, allergies and                        sensitivities were reviewed. The patient's tolerance of                        previous anesthesia was reviewed.                       - The risks and benefits of the procedure and the                        sedation options and risks were discussed with the                        patient. All questions were answered and informed                        consent was obtained.                       - After reviewing the risks and benefits, the patient                        was deemed in satisfactory condition to undergo the                        procedure.                       After obtaining informed consent, the endoscope was                        passed under direct vision. Throughout the procedure,                        the patient's blood pressure, pulse, and oxygen                        saturations were monitored continuously. The                        Colonoscope was  introduced through the mouth, and                        advanced to the second part of duodenum. The upper GI                        endoscopy was accomplished without difficulty. The                        patient tolerated the procedure well. Findings:      The examined  esophagus was normal.      One large, cratered gastric ulcer with adherent clot was found at the       incisura. Coagulation for hemostasis using heater probe was successful.      One small, non-bleeding cratered gastric ulcer was found in the gastric       antrum.      The exam was otherwise without abnormality.      The examined duodenum was normal. Impression:           - Normal esophagus.                       - Gastric ulcer with adherent clot. Treated with a                        heater probe.                       - Non-bleeding gastric ulcer.                       - The examination was otherwise normal.                       - Normal examined duodenum.                       - No specimens collected. Recommendation:       - Discharge patient to home.                       - Observe patient's clinical course.                       - The findings and recommendations were discussed with                        the patient.                       - Continue present medications. Procedure Code(s):    --- Professional ---                       [phone], Esophagogastroduodenoscopy, flexible, transoral;                        with control of bleeding, any method Diagnosis Code(s):    --- Professional ---                       K25.4, Chronic or unspecified gastric ulcer with  hemorrhage                       K25.9, Gastric ulcer, unspecified as acute or chronic,                        without hemorrhage or perforation                       K92.0, Hematemesis CPT copyright 2016 [employer]. All rights reserved. The codes documented in this report are preliminary and upon coder review may  be revised to meet current compliance requirements. Hulen Luster, MD 10/01/2015 3:32:22 PM This report has been signed electronically. Number of Addenda: 0 Note Initiated On: 10/01/2015 3:07 PM      [facility]

## 2015-10-01 NOTE — Progress Notes (Signed)
Notified Dr. Leroy Libman of pt morning lab results, Na 133, and Ca 6.2 (corrected calcium 7.8).  Per Dr. Leroy Libman, order for 1 gram Calcium gluconate iv once.

## 2015-10-01 NOTE — Progress Notes (Signed)
Report given to RN Evelena Peat in Endoscopy informed nurse pt alert and oriented; on RA with O2 sats upper 90's; vss;

## 2015-10-01 NOTE — Anesthesia Preprocedure Evaluation (Signed)
Anesthesia Evaluation  Patient identified by MRN, date of birth, ID band Patient awake    Reviewed: Allergy & Precautions, H&P , NPO status , Patient's Chart, lab work & pertinent test results, reviewed documented beta blocker date and time   History of Anesthesia Complications Negative for: history of anesthetic complications  Airway Mallampati: III  TM Distance: >3 FB Neck ROM: full    Dental no notable dental hx.    Pulmonary neg shortness of breath, neg sleep apnea, neg COPD, neg recent URI,  Lung cancer   Pulmonary exam normal breath sounds clear to auscultation       Cardiovascular Exercise Tolerance: Good hypertension, (-) angina(-) CAD, (-) Past MI, (-) Cardiac Stents and (-) CABG Normal cardiovascular exam(-) dysrhythmias (-) Valvular Problems/Murmurs Rhythm:regular Rate:Normal     Neuro/Psych negative neurological ROS  negative psych ROS   GI/Hepatic negative GI ROS, Neg liver ROS,   Endo/Other  negative endocrine ROS  Renal/GU negative Renal ROS  negative genitourinary   Musculoskeletal   Abdominal   Peds  Hematology negative hematology ROS (+)   Anesthesia Other Findings Past Medical History:   HTN (hypertension)                                           Brain lesion                                                 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       History of radiation therapy                                 Adenocarcinoma of lung ([place])                    12/05/2014    Reproductive/Obstetrics negative OB ROS                             Anesthesia Physical Anesthesia Plan  ASA: III  Anesthesia Plan: General   Post-op Pain Management:    Induction:   Airway Management Planned:   Additional Equipment:   Intra-op Plan:   Post-operative Plan:   Informed Consent: I have reviewed the patients History and Physical, chart, labs and discussed  the procedure including the risks, benefits and alternatives for the proposed anesthesia with the patient or authorized representative who has indicated his/her understanding and acceptance.   Dental Advisory Given  Plan Discussed with: Anesthesiologist, CRNA and Surgeon  Anesthesia Plan Comments:         Anesthesia Quick Evaluation

## 2015-10-01 NOTE — Progress Notes (Signed)
Pt is alert and oriented, no complaints of pain.  NSR, lungs clear, on room air.  Octreotide, protonix,and NS infusing.  No bloody stool or emesis so far this shift.  VSS, afebrile.  On morning labs, hemoglobin 8.4, was 9.2 last night, and 10.3 upon arrival to ED yesterday.  Spoke with Dr. Leroy Libman of decrease in hemoglobin, per Dr. Leroy Libman, obtain type and screen, and crossmatch 2 units RBC's.

## 2015-10-01 NOTE — Plan of Care (Signed)
Problem: Fluid volume deficit  Goal: Pt will show no signs and symptoms of excessive bleeding.  Outcome:  Met  Pt had no new signs of bleeding and vital signs remained stable through out shift. BP stayed WDL and HR remained is NSR.   Problem: Bowel/GI  Goal: Pt will not show signs and symptoms of GI bleeding. Outcome Met  Pt has had no new episodes of vomiting blood. No new signs of bleeding.

## 2015-10-01 NOTE — Consult Note (Signed)
GI Inpatient Consult Note  Reason for Consult: Hematemesis    Attending Requesting Consult: Dr. Doy Hutching  History of Present Illness: Luis [name] is a 50 y.o. 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  Currently undergoing radiation and chemotherapy,  reports that he started having abdominal pain last night and experienced 3 episodes of vomiting just blood, described as bright red blood with dark spots.  He denies vomiting any undigested food.  He has not had any further episodes.  He has not had anything like this before.  He reports a bowel movement today, describes as small, brown.  He reports his abdominal pain has resolved.  He has never had an upper endoscopy or colonoscopy.  Does report significant family history of colon cancer in his mother at age 67.  Past Medical History:  Past Medical History  Diagnosis Date  . HTN (hypertension)   . Brain lesion   . 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 History of radiation therapy   . Adenocarcinoma of lung ([place]) 12/05/2014    Problem List: Patient Active Problem List   Diagnosis Date Noted  . Upper GI bleed 09/30/2015  . Acute blood loss anemia 09/30/2015  . Epigastric pain 09/30/2015  . Metastatic primary lung cancer ([place]) 09/30/2015  . GI bleed 09/30/2015  . Left hand weakness 09/10/2015  . Adenocarcinoma of lung ([place]) 12/05/2014  . Spinal cord tumor ([place]) 03/08/2014  . Essential hypertension, benign 03/08/2014  . Metastasis ([place]) 03/08/2014  . Weakness 03/06/2014    Past Surgical History: Past Surgical History  Procedure Laterality Date  . Laminectomy N/A 03/07/2014    Procedure: T1-T2 Laminectomy with Decompression of Cord and Removal of Cancer  thoracic one/two;  Surgeon: Kristeen Miss, MD;  Location: [facility];  Service: Neurosurgery;  Laterality: N/A;  . Ct guided biopsy  ([facility] hx)  05/28/2014    Allergies: No Known Allergies  Home Medications: Prescriptions prior to admission   Medication Sig Dispense Refill Last Dose  . dexamethasone (DECADRON) 4 MG tablet Take 1 tablet (4 mg total) by mouth 2 (two) times daily. 60 tablet 0 09/30/2015 at Unknown time  . metoprolol succinate (TOPROL-XL) 50 MG 24 hr tablet Take 1 tablet (50 mg total) by mouth daily. 30 tablet 5 Past Month at Unknown time  . osimertinib mesylate (TAGRISSO) 80 MG tablet Take 1 tablet (80 mg total) by mouth daily. With or without food. 30 tablet 3 09/29/2015 at Unknown time  . pantoprazole (PROTONIX) 40 MG tablet Take 1 tablet (40 mg total) by mouth 2 (two) times daily. (Patient taking differently: Take 40 mg by mouth 2 (two) times daily as needed (for stomach issues.). ) 60 tablet 0 09/27/2015  . diphenoxylate-atropine (LOMOTIL) 2.5-0.025 MG tablet Take 1 tablet by mouth 4 (four) times daily as needed for diarrhea or loose stools (take it along with imoidum for diarrhea). 60 tablet 0 Taking  . hydrochlorothiazide (HYDRODIURIL) 25 MG tablet Take 1 tablet (25 mg total) by mouth daily. 30 tablet 5 Taking  . levETIRAcetam (KEPPRA) 500 MG tablet Take 1 tablet (500 mg total) by mouth 2 (two) times daily. (Patient not taking: Reported on 09/13/2015) 60 tablet 0 Not Taking  . oxyCODONE (OXY IR/ROXICODONE) 5 MG immediate release tablet Take 1 tablet (5 mg total) by mouth every 4 (four) hours as needed for moderate pain or severe pain. 30 tablet 0 Taking  . potassium chloride (K-DUR,KLOR-CON) 10 MEQ tablet Take 1 tablet (10  mEq total) by mouth 2 (two) times daily. (Patient not taking: Reported on 09/13/2015) 60 tablet 3 Not Taking  . prochlorperazine (COMPAZINE) 10 MG tablet Take 1 tablet (10 mg total) by mouth every 6 (six) hours as needed for nausea or vomiting. 90 tablet 1 Taking   Home medication reconciliation was completed with the patient.   Scheduled Inpatient Medications:   . dexamethasone  4 mg Oral BID  . docusate sodium  100 mg Oral BID  . levETIRAcetam  500 mg Oral BID  . metoprolol succinate  50 mg Oral  Daily  . osimertinib mesylate  80 mg Oral Daily    Continuous Inpatient Infusions:   . sodium chloride 100 mL/hr at 10/01/15 1129  . octreotide  (SANDOSTATIN)    IV infusion 50 mcg/hr (10/01/15 1129)  . pantoprozole (PROTONIX) infusion 8 mg/hr (10/01/15 1129)    PRN Inpatient Medications:  acetaminophen **OR** acetaminophen, bisacodyl, morphine injection, ondansetron **OR** ondansetron (ZOFRAN) IV  Family History: family history includes Colon cancer in his mother; Heart disease in his father; Hypertension in his brother and sister.  T  Social History:   reports that he has never smoked. He has never used smokeless tobacco. He reports that he drinks about 0.5 oz of alcohol per week. He reports that he does not use illicit drugs.   Review of Systems: Constitutional: Weight is stable.  Eyes: No changes in vision. ENT: No oral lesions, sore throat.  GI: see HPI.  Heme/Lymph: No easy bruising.  CV: No chest pain.  GU: No hematuria.  Integumentary: No rashes.  Neuro: No headaches.  Psych: No depression/anxiety.  Endocrine: No heat/cold intolerance.  Allergic/Immunologic: No urticaria.  Resp: No cough, SOB.  Musculoskeletal: No joint swelling.    Physical Examination: BP 118/88 mmHg  Pulse 81  Temp(Src) 98 F (36.7 C) (Oral)  Resp 16  Ht '5\' 7"'$  (1.702 m)  Wt 61.3 kg (135 lb 2.3 oz)  BMI 21.16 kg/m2  SpO2 99% Gen: NAD, alert and oriented x 4 HEENT: PEERLA, EOMI, Neck: supple, no JVD or thyromegaly Chest: CTA bilaterally, no wheezes, crackles, or other adventitious sounds CV: RRR, no m/g/c/r Abd: soft, NT, ND, +BS in all four quadrants; no HSM, guarding, ridigity, or rebound tenderness Ext: no edema, well perfused with 2+ pulses, Skin: no rash or lesions noted Lymph: no LAD  Data: Lab Results  Component Value Date   WBC 8.5 10/01/2015   HGB 8.4* 10/01/2015   HCT 26.3* 10/01/2015   MCV 77.8* 10/01/2015   PLT 107* 10/01/2015    Recent Labs Lab 09/30/15 1413  09/30/15 2115 10/01/15 0501  HGB 10.3* 9.2* 8.4*   Lab Results  Component Value Date   NA 133* 10/01/2015   K 3.8 10/01/2015   CL 109 10/01/2015   CO2 22 10/01/2015   BUN 19 10/01/2015   CREATININE 0.63 10/01/2015   Lab Results  Component Value Date   ALT 22 10/01/2015   AST 20 10/01/2015   ALKPHOS 53 10/01/2015   BILITOT 0.8 10/01/2015    Recent Labs Lab 09/30/15 1413 10/01/15 0501  APTT <24*  --   INR 1.10 1.13    Assessment/Plan: Mr. Krauter is a 50 y.o. male with three episodes of hematemesis yesterday along with abdominal pain.  No further vomiting, no further abd pain. Hemoglobin from September 11, 2015 reports as 12.3.  Hemoglobin on admission 10.3, currently 8.4.  MCV 77.3.  Is being treated with the Protonix drip, octreotide, currently n.p.o.   Recommendations:  Dr. Candace Cruise will perform upper endoscopy this afternoon for further evaluation.  We will continue to follow with you. Thank you for the consult. Please call with questions or concerns.  Salvadore Farber, PA-C  I personally performed these services.

## 2015-10-01 NOTE — Progress Notes (Signed)
Cancer center contacted regarding pt's possible radiation treatment that was scheduled for today.  Spoke with Radiation nurse, Vicente Males, treatment has been put off today due to pt vomiting blood yesterday.   Cancer center will re-evaluate tomorrow.

## 2015-10-02 ENCOUNTER — Ambulatory Visit: Payer: BLUE CROSS/BLUE SHIELD

## 2015-10-02 LAB — BASIC METABOLIC PANEL
ANION GAP: 4 — AB (ref 5–15)
BUN: 9 mg/dL (ref 6–20)
CALCIUM: 6.4 mg/dL — AB (ref 8.9–10.3)
CHLORIDE: 106 mmol/L (ref 101–111)
CO2: 21 mmol/L — AB (ref 22–32)
Creatinine, Ser: 0.64 mg/dL (ref 0.61–1.24)
GFR calc non Af Amer: >60 mL/min (ref >60)
GLUCOSE: 119 mg/dL — AB (ref 65–99)
Potassium: 3.7 mmol/L (ref 3.5–5.1)
Sodium: 131 mmol/L — ABNORMAL LOW (ref 135–145)

## 2015-10-02 LAB — CBC
HEMATOCRIT: 27.2 % — AB (ref 40.0–52.0)
HEMOGLOBIN: 8.8 g/dL — AB (ref 13.0–18.0)
MCH: 24.9 pg — ABNORMAL LOW (ref 26.0–34.0)
MCHC: 32.3 g/dL (ref 32.0–36.0)
MCV: 77.3 fL — AB (ref 80.0–100.0)
Platelets: 114 10*3/uL — ABNORMAL LOW (ref 150–440)
RBC: 3.52 MIL/uL — ABNORMAL LOW (ref 4.40–5.90)
RDW: 21.8 % — AB (ref 11.5–14.5)
WBC: 5.6 10*3/uL (ref 3.8–10.6)

## 2015-10-02 NOTE — Progress Notes (Signed)
Pt diet advanced to full diet today per Dr.Oh note. Cancer center delayed radiation treatment until pt was in regular floor room and out of ICU despite floor status as a patient.   Pt refused bath at 3 pm wanting to wait until he arrived on the regular floor and in a room with more privacy.  Will continue to assess.

## 2015-10-02 NOTE — Care Management (Signed)
Patient to transfer out of icu today.  At present, have not identified any discharge needs

## 2015-10-02 NOTE — Progress Notes (Signed)
eLink Physician-Brief Progress Note Patient Name: Luis [name] DOB: 04-Nov-1965 MRN: [id number]   Date of Service  10/02/2015  HPI/Events of Note  Notified of hypocalcemia on a.m. Labs. Serum albumin 2.0. Corrected calcium 8.0.  eICU Interventions  1. Holding on calcium replacement at this time. 2. Checking Ionized calcium     Intervention Category Intermediate Interventions: Electrolyte abnormality - evaluation and management  Tera Partridge 10/02/2015, 6:09 AM

## 2015-10-02 NOTE — Progress Notes (Signed)
Pt diet advanced to Full liquid diet as requested in Dr.Oh note. To advance as tolerated.  Pt has had no complaints of pain at this time. Pt has slept most of shift. Will continue to assess.

## 2015-10-02 NOTE — Progress Notes (Signed)
Pt had no complaints of pain on my shift. Tolerated regular diet well at supper time. Pt has slept most of this shift. Denied bath wanting until he "got to his room on the floor." Cancer center will re-evaluate radiation treatment in the am when he is in a regular room. Report given to Del on coming RN.

## 2015-10-02 NOTE — Anesthesia Postprocedure Evaluation (Signed)
Anesthesia Post Note  Patient: Tahir Hollenbach  Procedure(s) Performed: Procedure(s) (LRB): ESOPHAGOGASTRODUODENOSCOPY (EGD) (N/A)  Patient location during evaluation: ICU Anesthesia Type: General Level of consciousness: awake and alert, oriented and patient cooperative Pain management: pain level controlled Respiratory status: spontaneous breathing and respiratory function stable Cardiovascular status: stable Postop Assessment: no signs of nausea or vomiting Anesthetic complications: no    Last Vitals:  Filed Vitals:   10/02/15 0500 10/02/15 0600  BP: 107/68 100/81  Pulse: 70 72  Temp:    Resp: 13 19    Last Pain:  Filed Vitals:   10/02/15 0605  PainSc: 0-No pain                 Johnna Acosta

## 2015-10-02 NOTE — Progress Notes (Signed)
[place] at [facility]                                                                                                                                                                                            Patient Demographics   Luis [name], is a 50 y.o. male, DOB - March 11, 1966, HCW:[id number]  Admit date - 09/30/2015   Admitting Physician Idelle Crouch, MD  Outpatient Primary MD for the patient is PROVIDER NOT IN SYSTEM   LOS - 2  Subjective: improving. No further bleeding. Tolerating CLD  Review of Systems:   CONSTITUTIONAL: No documented fever. No fatigue, weakness. No weight gain, no weight loss.  EYES: No blurry or double vision.  ENT: No tinnitus. No postnasal drip. No redness of the oropharynx.  RESPIRATORY: No cough, no wheeze, no hemoptysis. No dyspnea.  CARDIOVASCULAR: No chest pain. No orthopnea. No palpitations. No syncope.  GASTROINTESTINAL: No nausea, no vomiting or diarrhea. No abdominal pain. No melena or hematochezia.  GENITOURINARY: No dysuria or hematuria.  ENDOCRINE: No polyuria or nocturia. No heat or cold intolerance.  HEMATOLOGY: No anemia. No bruising. No bleeding.  INTEGUMENTARY: No rashes. No lesions.  MUSCULOSKELETAL: No arthritis. No swelling. No gout.  NEUROLOGIC: Left hand arm numbness and weakness PSYCHIATRIC: No anxiety. No insomnia. No ADD.    Vitals:   Filed Vitals:   10/02/15 1000 10/02/15 1036 10/02/15 1100 10/02/15 1200  BP: 106/78 106/78 109/73 100/71  Pulse: 81 85 80 79  Temp:      TempSrc:      Resp: 28  16 34  Height:      Weight:      SpO2: 100%  100% 100%    Wt Readings from Last 3 Encounters:  10/02/15 60.5 kg (133 lb 6.1 oz)  09/28/15 59 kg (130 lb 1.1 oz)  09/13/15 59.7 kg (131 lb 9.8 oz)     Intake/Output Summary (Last 24 hours) at 10/02/15 1710 Last data filed at 10/02/15 0900  Gross per 24 hour  Intake    300 ml  Output   2850 ml  Net  -2550 ml    Physical Exam:    GENERAL: Pleasant-appearing in no apparent distress.  HEAD, EYES, EARS, NOSE AND THROAT: Atraumatic, normocephalic. Extraocular muscles are intact. Pupils equal and reactive to light. Sclerae anicteric. No conjunctival injection. No oro-pharyngeal erythema.  NECK: Supple. There is no jugular venous distention. No bruits, no lymphadenopathy, no thyromegaly.  HEART: Regular rate and rhythm,. No murmurs, no rubs, no clicks.  LUNGS: Clear to auscultation bilaterally. No rales or rhonchi. No wheezes.  ABDOMEN: Soft,  flat, nontender, nondistended. Has good bowel sounds. No hepatosplenomegaly appreciated.  EXTREMITIES: No evidence of any cyanosis, clubbing, or peripheral edema.  +2 pedal and radial pulses bilaterally.  NEUROLOGIC: The patient is alert, awake, and oriented x3 left arm weakness SKIN: Moist and warm with no rashes appreciated.  Psych: Not anxious, depressed LN: No inguinal LN enlargement    Antibiotics   Anti-infectives    None      Medications   Scheduled Meds: . dexamethasone  4 mg Oral BID  . docusate sodium  100 mg Oral BID  . levETIRAcetam  500 mg Oral BID  . metoprolol succinate  50 mg Oral Daily  . osimertinib mesylate  80 mg Oral Daily   Continuous Infusions: . sodium chloride 100 mL/hr at 10/01/15 2114  . octreotide  (SANDOSTATIN)    IV infusion 50 mcg/hr (10/02/15 0603)  . pantoprozole (PROTONIX) infusion 8 mg/hr (10/02/15 0603)   PRN Meds:.acetaminophen **OR** acetaminophen, bisacodyl, morphine injection, ondansetron **OR** ondansetron (ZOFRAN) IV   Data Review:   Micro Results Recent Results (from the past 240 hour(s))  MRSA PCR Screening     Status: None   Collection Time: 09/30/15  8:02 PM  Result Value Ref Range Status   MRSA by PCR NEGATIVE NEGATIVE Final    Comment:        The GeneXpert MRSA Assay (FDA approved for NASAL specimens only), is one component of a comprehensive MRSA colonization surveillance program. It is not intended to  diagnose MRSA infection nor to guide or monitor treatment for MRSA infections.     Radiology Reports Ct Head Wo Contrast  09/10/2015  CLINICAL DATA:  Left hand numbness for 1 week. History of brain and lung cancer. EXAM: CT HEAD WITHOUT CONTRAST TECHNIQUE: Contiguous axial images were obtained from the base of the skull through the vertex without intravenous contrast. COMPARISON:  MRI of August 23, 2015. FINDINGS: 29 x 17 mm sclerotic density is seen superiorly in left frontal skull which may represent metastatic lesion. Ventricular size is within normal limits. No midline shift is noted. Minimal diffuse cortical atrophy is noted. Mild chronic ischemic white matter disease is noted. Ill-defined low density is again noted in right cerebellar hemisphere consistent with metastatic disease. No definite hemorrhage is noted. Other metastatic lesions seen on prior MRI are not well visualized on this unenhanced study. IMPRESSION: 2.9 cm sclerotic lesions seen in left frontal skull which may represent metastatic lesion. Ill-defined low density is noted in the right cerebellar hemisphere consistent with metastatic lesion and surrounding edema noted on prior MRI. Electronically Signed   By: Marijo Conception, M.D.   On: 09/10/2015 11:50   Mr Jeri Cos WU Contrast  09/11/2015  CLINICAL DATA:  50 year old hypertensive male with lung cancer and known metastatic brain lesions. Worsening left upper extremity weakness. Subsequent encounter. EXAM: MRI HEAD WITHOUT AND WITH CONTRAST TECHNIQUE: Multiplanar, multiecho pulse sequences of the brain and surrounding structures were obtained without and with intravenous contrast. CONTRAST:  69m MULTIHANCE GADOBENATE DIMEGLUMINE 529 MG/ML IV SOLN COMPARISON:  09/03/2015 head CT. 08/23/2015, 05/24/2015 in 02/2019 01/30/2014 brain MR. FINDINGS: When compared to examination earlier this month, multiple intracranial metastatic lesions (supratentorial and infratentorial) appear the same  or slightly smaller. Largest metastatic lesion posterior right cerebellum measures 2 x 1.6 x 1.6 cm versus prior 2.2 x 1.7 x 1.6 cm. Surrounding vasogenic edema. No new intracranial metastatic lesion detected. Similar appearance of left superior convexity calvarial metastatic lesion with mild bony expansion spanning over 2.8  x 2.7 cm. No acute infarct. Some of the cerebellum metastatic lesions are partially hemorrhagic (without change) otherwise no intracranial hemorrhage noted. Global atrophy without hydrocephalus. Opacification left mastoid air cells and middle ear cavity. Similar appearance previously. No obstructing lesion of eustachian tube noted. Paranasal sinus mucosal thickening most notable left maxillary sinus with polypoid opacification inferior aspect. No orbital abnormality detected. Shallow sella.  Cervical medullary junction within normal limits. Major intracranial vascular structures are patent. IMPRESSION: When compared to examination earlier this month, multiple intracranial metastatic lesions (supratentorial and infratentorial) appear the same or slightly smaller. Largest metastatic lesion posterior right cerebellum measures 2 x 1.6 x 1.6 cm versus prior 2.2 x 1.7 x 1.6 cm. Surrounding vasogenic edema. No new intracranial metastatic lesion detected. Similar appearance of left superior convexity calvarial metastatic lesion with mild bony expansion spanning over 2.8 x 2.7 cm. No acute infarct. Global atrophy. Opacification left mastoid air cells and middle ear cavity. Similar appearance previously. No obstructing lesion of eustachian tube noted. Paranasal sinus mucosal thickening most notable left maxillary sinus with polypoid opacification inferior aspect. Electronically Signed   By: Genia Del M.D.   On: 09/11/2015 11:19   Mr Cervical Spine W Wo Contrast  09/14/2015  CLINICAL DATA:  Metastatic lung cancer with brain involvement and metastatic disease to the C7 vertebral body. Left upper  extremity weakness and numbness. EXAM: MRI CERVICAL SPINE WITHOUT AND WITH CONTRAST TECHNIQUE: Multiplanar and multiecho pulse sequences of the cervical spine, to include the craniocervical junction and cervicothoracic junction, were obtained according to standard protocol without and with intravenous contrast. CONTRAST:  70m MULTIHANCE GADOBENATE DIMEGLUMINE 529 MG/ML IV SOLN COMPARISON:  09/11/2015 brain MRI ; thoracic spine MRI from 09/12/2015 FINDINGS: Compression at T2 with posterior bony retropulsion not changed from prior. Patient is undergone upper thoracic radiotherapy. This region was discussed in the recent thoracic spine MRI. No significant abnormal spinal cord signal is observed. The craniocervical junction appears unremarkable. Is abnormal enhancement and edema signal throughout the left side of the T1 vertebral body involving the pedicles, and facets. There is also abnormal edema and enhancement in the C7 spinous process. Smaller focal lesions noted in the right T1 facet region and eccentric to the left in the T1 vertebral body. There is epidural tumor along the left anterior margin of the thecal sac primarily extending from the C6-7 level down to the upper T1 level, measuring about 7 mm in thickness and extending through an filling much of the left neural foramen, encasing the left C8 nerve and likely with some mild extension in the inferior portion of the left C6-7 neural foramen to involve the left C7 nerve. No cord invasion. No other regions of epidural tumor noted in the cervical spine. Indistinct densities posteriorly in the left upper lobe are partially included on today's exam. There is expansion of the medial left second rib and adjacent left T2 transverse process. Additional findings at individual levels are as follows: C2-3:  Unremarkable. C3-4:  No impingement.  Small central disc protrusion. C4-5: Mild central narrowing of the thecal sac due to central disc protrusion. C5-6: Moderate  right and mild left foraminal stenosis due to disc bulge and uncinate spurring. C6-7: Mild left foraminal stenosis due to uncinate and facet spurring. Suspected epidural enhancement extending in the inferior portion of the left neural foramen partially surrounding the left C7 nerve. Small central disc protrusion with mild central narrowing of the thecal sac. C7-T1: Considerable epidural tumor extending in the left neural foramen and  at least partially encasing the left C8 nerve. T1-2: Left foraminal stenosis due to chronic tumor expansion of the superior articular facet of T2 IMPRESSION: 1. Epidural tumor along the left anterior margin of the thecal sac extending from C6-7 down to the upper T1 level, extending primarily into the left neural foramen at C7-T1 and to a lesser extent into the inferior margin of the left neural foramen at C6-7, potentially interfering with the left C7 and C8 nerves. 2. As noted on recent thoracic spine MRI, the tumor expansion of the superior articular facet of T2 causes left foraminal stenosis at T1-2. 3. Otherwise, cervical spondylosis and degenerative disc disease cause moderate impingement at C5-6 and mild impingement at C4-5 and C6-7, as detailed above. 4. Enhancing tumor in the C7 vertebral body especially eccentric to the left, and extending to involve the pedicles and facets. There is potentially a focus of tumor in the spinous process of C7 and there is edema and enhancement in the left C6 inferior articular facet region possibly due to tumor. Electronically Signed   By: Van Clines M.D.   On: 09/14/2015 14:05   Mr Thoracic Spine W Wo Contrast  09/12/2015  CLINICAL DATA:  Weakness of the left upper extremity.  Lung cancer EXAM: MRI THORACIC SPINE WITHOUT AND WITH CONTRAST TECHNIQUE: Multiplanar and multiecho pulse sequences of the thoracic spine were obtained without and with intravenous contrast. CONTRAST:  7m MULTIHANCE GADOBENATE DIMEGLUMINE 529 MG/ML IV SOLN  COMPARISON:  03/07/2014 FINDINGS: Sagittal counting sequence of the cervical spine shows a C7 metastasis which involves the entire visualized vertebral and infiltrates the left C7-T1 and C6-7 foramina. Patient is undergone T1-T5 radiotherapy of previously bulky metastatic disease. There has also been a T1 decompressive laminectomy. Spinal cord compression has resolved with no residual signal abnormality. Tumor at this level is nonenhancing compared to the other levels. Aside from the T8 and T9 levels there is metastatic disease within all of the thoracic vertebrae. There are chronic compression fractures at T2 and T5 with height loss greater than 50%. Height loss has progressed since 2015 comparison. Early ventral epidural tumor growth at T11, without cord compromise. Chronic T1-2 and T2-3 left foraminal stenosis from metastatic expansion and distortion of the posterior elements. IMPRESSION: 1. Large C7 metastasis with left C6-7 and C7-T1 foraminal infiltration that could certainly explain the left upper extremity symptoms. Recommend dedicated cervical spine imaging. 2. T1 through T5 radiotherapy with good response compared to 2015. There is no residual cord compression and the metastases are nonenhancing. 3. Notable T11 metastasis with early ventral epidural infiltration. Electronically Signed   By: JMonte FantasiaM.D.   On: 09/12/2015 10:35   Dg Chest Portable 1 View  09/30/2015  CLINICAL DATA:  Currently being treated for lung CA, vomiting blood today EXAM: PORTABLE CHEST 1 VIEW COMPARISON:  08/20/2015 chest CT FINDINGS: Heart size is normal. Nodule adjacent to the aortic knob is smaller compared with previous imaging studies. Numerous sclerotic expansile osseous metastases are again noted, most prominently involving the right posterior seventh rib. There are no focal consolidations or pleural effusions. No pulmonary edema. IMPRESSION: 1.  No evidence for interval infiltrate. 2. Smaller left upper lobe  nodule. 3. Osseous metastatic disease. Electronically Signed   By: ENolon NationsM.D.   On: 09/30/2015 15:07     CBC  Recent Labs Lab 09/30/15 1413 09/30/15 2115 10/01/15 0501 10/02/15 0431  WBC 10.6  --  8.5 5.6  HGB 10.3* 9.2* 8.4* 8.8*  HCT 31.8*  --  26.3* 27.2*  PLT 140*  --  107* 114*  MCV 77.3*  --  77.8* 77.3*  MCH 25.0*  --  24.8* 24.9*  MCHC 32.4  --  31.9* 32.3  RDW 21.7*  --  22.2* 21.8*  LYMPHSABS 0.7*  --   --   --   MONOABS 0.6  --   --   --   EOSABS 0.1  --   --   --   BASOSABS 0.1  --   --   --     Chemistries   Recent Labs Lab 09/30/15 1413 10/01/15 0501 10/02/15 0431  NA 137 133* 131*  K 4.1 3.8 3.7  CL 107 109 106  CO2 23 22 21*  GLUCOSE 119* 124* 119*  BUN 47* 19 9  CREATININE 0.94 0.63 0.64  CALCIUM 7.5* 6.2* 6.4*  AST 27 20  --   ALT 27 22  --   ALKPHOS 55 53  --   BILITOT 0.4 0.8  --       Assessment & Plan   * Upper GI bleed: S/P EGD on 3/20 - Multiple gastric ulcers, with 1 large ulcer with adherent clot at incisura which was cauterized. Resume clears. Advance slowly as tolerated. stop octreotide. Will need protonix at least bid for next 6-8 weeks per GI  * Acute blood loss anemia: Hb 10.3 -> 8.4 ->8.8, will monitor, likely due to Multiple gastric ulcers, with 1 large ulcer with adherent clot at incisura. Monitor Hb   * Epigastric pain: much better now.  * Metastatic primary lung cancer ([place]) with Brain metastases and bone metastases: f/by onco as an outpt  * Thrombocytopenia: platelets dropping 140 -> 107 ->114. Avoid antiplatelets      Code Status Orders        Start     Ordered   09/10/15 1358  Full code   Continuous     09/10/15 1358    Code Status History    Date Active Date Inactive Code Status Order ID Comments User Context   05/29/2015 10:13 AM 05/30/2015  3:29 AM Full Code [id number]  Inez Catalina, MD [place]   03/07/2014  7:04 PM 03/11/2014  6:11 PM Full Code [id number]  Kristeen Miss, MD Inpatient   03/06/2014  10:45 PM 03/07/2014  7:04 PM Full Code [id number]  Shanda Howells, MD Inpatient       Can transfer to floor, possible D/C in am    Consults  oncology   Lab Results  Component Value Date   PLT 114* 10/02/2015     Time Spent in minutes   79mn   Greater than 50% of time spent in care coordination and counseling patient regarding the condition and plan of care.   [facility] Vance Hochmuth M.D on 10/02/2015 at 5:10 PM  Between 7am to 6pm - Pager - [phone]  After 6pm go to [email or web address] - password [email or web address]   Office  [phone]

## 2015-10-02 NOTE — Progress Notes (Signed)
Chaplain rounded unit and provided a compassionate presence and support with silent prayer to patient who appeared to be sleeping. [name] [phone]

## 2015-10-02 NOTE — Progress Notes (Signed)
Received call from lab to report critical calcium.  Called E-link to report value.

## 2015-10-03 ENCOUNTER — Other Ambulatory Visit: Payer: Self-pay | Admitting: *Deleted

## 2015-10-03 ENCOUNTER — Ambulatory Visit
Admit: 2015-10-03 | Discharge: 2015-10-03 | Disposition: A | Discharge status: Home / Self Care | Payer: BLUE CROSS/BLUE SHIELD | Attending: Radiation Oncology | Admitting: Radiation Oncology

## 2015-10-03 ENCOUNTER — Encounter: Payer: Self-pay | Admitting: Gastroenterology

## 2015-10-03 ENCOUNTER — Ambulatory Visit
Admission: RE | Admit: 2015-10-03 | Discharge: 2015-10-03 | Disposition: A | Discharge status: Home / Self Care | Payer: BLUE CROSS/BLUE SHIELD | Source: Ambulatory Visit | Attending: Radiation Oncology | Admitting: Radiation Oncology

## 2015-10-03 DIAGNOSIS — Z510 Encounter for antineoplastic radiation therapy: Secondary | ICD-10-CM | POA: Diagnosis not present

## 2015-10-03 LAB — TYPE AND SCREEN
ABO/RH(D): O POS
Antibody Screen: NEGATIVE
UNIT DIVISION: 0
UNIT DIVISION: 0

## 2015-10-03 LAB — CALCIUM, IONIZED: Calcium, Ionized, Serum: 4.4 mg/dL — ABNORMAL LOW (ref 4.5–5.6)

## 2015-10-03 LAB — PREPARE RBC (CROSSMATCH)

## 2015-10-03 MED ORDER — DEXAMETHASONE 0.5 MG PO TABS
1.0000 mg | ORAL_TABLET | Freq: Two times a day (BID) | ORAL | Status: DC
  Filled 2015-10-03: qty 2

## 2015-10-03 MED ORDER — PANTOPRAZOLE SODIUM 40 MG PO TBEC: 40.0000 mg | DELAYED_RELEASE_TABLET | Freq: Two times a day (BID) | ORAL | Status: DC

## 2015-10-03 NOTE — Progress Notes (Signed)
Luis [name]   DOB:1965-08-03   [id number]    Subjective: pt has not vomited any more blood. His appetite is okay. No nausea.   ROS: denies any abdominal pain.  Objective:  Filed Vitals:   10/03/15 0725 10/03/15 0732  BP: 117/77 117/77  Pulse: 75 77  Temp:    Resp:       Intake/Output Summary (Last 24 hours) at 10/03/15 0811 Last data filed at 10/03/15 0700  Gross per 24 hour  Intake    300 ml  Output   1200 ml  Net   -900 ml    GENERAL: Well-nourished well-developed; Alert, no distress and comfortable.He is alone. EYES: no pallor or icterus OROPHARYNX: no thrush or ulceration; good dentition  NECK: supple, no masses felt LYMPH: no palpable lymphadenopathy in the cervical, axillary or inguinal regions LUNGS: clear to auscultation and No wheeze or crackles HEART/CVS: regular rate & rhythm and no murmurs; No lower extremity edema ABDOMEN:abdomen soft, non-tender and normal bowel sounds Musculoskeletal:no cyanosis of digits and no clubbing  PSYCH: alert & oriented x 3; slow speech. 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  # Hematemesis- EGD showed gastric ulcer. Appreciate GI input. On PPI.  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 Tagrisso does have intracranial responses. Continue tagrisso at this time/ pt can use his medication form home.   # Left upper extremity  weakness/ secondary to infiltration by the tumor in the cervical region- check with radiation today. Patient can restart radiation today. I'll also cut down the dose of steroids dexamethasone to 1 mg twice a day.  # Discussed with Dr. Manuella Ghazi. Patient can be discharged home. Patient will follow-up with me 1 week.   Cammie Sickle, MD 10/03/2015  8:11 AM

## 2015-10-03 NOTE — Discharge Instructions (Signed)
Peptic Ulcer A peptic ulcer is a painful sore in the lining of your esophagus, stomach, or in the first part of your small intestine. The main causes of an ulcer can be:  An infection.  Using certain pain medicines too often or too much.  Smoking. HOME CARE  Avoid smoking, alcohol, and caffeine.  Avoid foods that bother you.  Only take medicine as told by your doctor. Do not take any medicines your doctor has not approved.  Keep all doctor visits as told. GET HELP IF:  You do not get better in 7 days after starting treatment.  You keep having an upset stomach (indigestion) or heartburn. GET HELP RIGHT AWAY IF:  You have sudden, sharp, or lasting belly (abdominal) pain.  You have bloody, black, or tarry poop (stool).  You throw up (vomit) blood or your throw up looks like coffee grounds.  You get light-headed, weak, or feel like you will pass out (faint).  You get sweaty or feel sticky and cold to the touch (clammy). MAKE SURE YOU:   Understand these instructions.  Will watch your condition.  Will get help right away if you are not doing well or get worse.   This information is not intended to replace advice given to you by your health care provider. Make sure you discuss any questions you have with your health care provider.   Document Released: 09/24/2009 Document Revised: 07/21/2014 Document Reviewed: 01/28/2012 Elsevier Interactive Patient Education [employer].

## 2015-10-04 ENCOUNTER — Ambulatory Visit
Admission: RE | Admit: 2015-10-04 | Discharge: 2015-10-04 | Disposition: A | Discharge status: Home / Self Care | Payer: BLUE CROSS/BLUE SHIELD | Source: Ambulatory Visit | Attending: Radiation Oncology | Admitting: Radiation Oncology

## 2015-10-04 DIAGNOSIS — C7951 Secondary malignant neoplasm of bone: Secondary | ICD-10-CM | POA: Diagnosis not present

## 2015-10-04 DIAGNOSIS — Z510 Encounter for antineoplastic radiation therapy: Secondary | ICD-10-CM | POA: Diagnosis not present

## 2015-10-04 DIAGNOSIS — C3490 Malignant neoplasm of unspecified part of unspecified bronchus or lung: Secondary | ICD-10-CM | POA: Diagnosis not present

## 2015-10-04 DIAGNOSIS — C7931 Secondary malignant neoplasm of brain: Secondary | ICD-10-CM | POA: Diagnosis not present

## 2015-10-05 ENCOUNTER — Ambulatory Visit
Admission: RE | Admit: 2015-10-05 | Discharge: 2015-10-05 | Disposition: A | Discharge status: Home / Self Care | Payer: BLUE CROSS/BLUE SHIELD | Source: Ambulatory Visit | Attending: Radiation Oncology | Admitting: Radiation Oncology

## 2015-10-05 DIAGNOSIS — Z510 Encounter for antineoplastic radiation therapy: Secondary | ICD-10-CM | POA: Diagnosis not present

## 2015-10-07 NOTE — Discharge Summary (Signed)
[facility] NAME: Luis [name]    MR#:  [id number]  DATE OF BIRTH:  07-12-1966  DATE OF ADMISSION:  09/30/2015 ADMITTING PHYSICIAN: Idelle Crouch, MD  DATE OF DISCHARGE: 10/03/2015  1:31 PM  PRIMARY CARE PHYSICIAN: PROVIDER NOT IN SYSTEM    ADMISSION DIAGNOSIS:  Upper GI bleed [K92.2]  DISCHARGE DIAGNOSIS:  Principal Problem:   Upper GI bleed Active Problems:   Acute blood loss anemia   Epigastric pain   Metastatic primary lung cancer ([place])   GI bleed   SECONDARY DIAGNOSIS:   Past Medical History  Diagnosis Date  . HTN (hypertension)   . Brain lesion   . 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 History of radiation therapy   . Adenocarcinoma of lung ([place]) 12/05/2014    HOSPITAL COURSE:  50 y.o. male has a past medical history significant for HTN and stage IV lung cancer with mets to brain and bone currently on chemo and radiation admitted with acute onset of epigastric pain associated with hematemesis and melena.    * Upper GI bleed: S/P EGD on 3/20 - Multiple gastric ulcers, with 1 large ulcer with adherent clot at incisura which was cauterized. Tolerated diet. Will need protonix at least bid for next 6-8 weeks   * Acute blood loss anemia: Hb 10.3 -> 8.4 ->8.8, will monitor, likely due to Multiple gastric ulcers, with 1 large ulcer with adherent clot at incisura.   * Epigastric pain: much better now. Likely due to ulcer  * Metastatic primary lung cancer ([place]) with Brain metastases and bone metastases: f/by onco as an outpt  * Thrombocytopenia: platelets dropping 140 -> 107 ->114. Avoid antiplatelets agents  No further hemetemesis and melena and hemodynamically stable.  DISCHARGE CONDITIONS:   stable  CONSULTS OBTAINED:  Treatment Team:  Manya Silvas, MD Lloyd Huger, MD  DRUG ALLERGIES:  No Known Allergies  DISCHARGE MEDICATIONS:   Discharge Medication List as of 10/03/2015  9:31 AM     CONTINUE these medications which have CHANGED   Details  pantoprazole (PROTONIX) 40 MG tablet Take 1 tablet (40 mg total) by mouth 2 (two) times daily., Starting 10/03/2015, Until Discontinued, Normal      CONTINUE these medications which have NOT CHANGED   Details  dexamethasone (DECADRON) 4 MG tablet Take 1 tablet (4 mg total) by mouth 2 (two) times daily., Starting 09/21/2015, Until Discontinued, Normal    metoprolol succinate (TOPROL-XL) 50 MG 24 hr tablet Take 1 tablet (50 mg total) by mouth daily., Starting 09/17/2015, Until Discontinued, Normal    osimertinib mesylate (TAGRISSO) 80 MG tablet Take 1 tablet (80 mg total) by mouth daily. With or without food., Starting 06/25/2015, Until Discontinued, Print    diphenoxylate-atropine (LOMOTIL) 2.5-0.025 MG tablet Take 1 tablet by mouth 4 (four) times daily as needed for diarrhea or loose stools (take it along with imoidum for diarrhea)., Starting 07/26/2015, Until Discontinued, Print    hydrochlorothiazide (HYDRODIURIL) 25 MG tablet Take 1 tablet (25 mg total) by mouth daily., Starting 09/17/2015, Until Discontinued, Normal    levETIRAcetam (KEPPRA) 500 MG tablet Take 1 tablet (500 mg total) by mouth 2 (two) times daily., Starting 09/12/2015, Until Discontinued, Normal    oxyCODONE (OXY IR/ROXICODONE) 5 MG immediate release tablet Take 1 tablet (5 mg total) by mouth every 4 (four) hours as needed for moderate pain or severe pain., Starting 09/12/2015, Until Discontinued, Print    potassium chloride (K-DUR,KLOR-CON) 10 MEQ  tablet Take 1 tablet (10 mEq total) by mouth 2 (two) times daily., Starting 05/01/2015, Until Discontinued, Normal    prochlorperazine (COMPAZINE) 10 MG tablet Take 1 tablet (10 mg total) by mouth every 6 (six) hours as needed for nausea or vomiting., Starting 06/25/2015, Until Discontinued, Normal         DISCHARGE INSTRUCTIONS:    DIET:  Regular diet  DISCHARGE CONDITION:  Good  ACTIVITY:  Activity as  tolerated  OXYGEN:  Home Oxygen: No.   Oxygen Delivery: room air  DISCHARGE LOCATION:  home   If you experience worsening of your admission symptoms, develop shortness of breath, life threatening emergency, suicidal or homicidal thoughts you must seek medical attention immediately by calling 911 or calling your MD immediately  if symptoms less severe.  You Must read complete instructions/literature along with all the possible adverse reactions/side effects for all the Medicines you take and that have been prescribed to you. Take any new Medicines after you have completely understood and accpet all the possible adverse reactions/side effects.   Please note  You were cared for by a hospitalist during your hospital stay. If you have any questions about your discharge medications or the care you received while you were in the hospital after you are discharged, you can call the unit and asked to speak with the hospitalist on call if the hospitalist that took care of you is not available. Once you are discharged, your primary care physician will handle any further medical issues. Please note that NO REFILLS for any discharge medications will be authorized once you are discharged, as it is imperative that you return to your primary care physician (or establish a relationship with a primary care physician if you do not have one) for your aftercare needs so that they can reassess your need for medications and monitor your lab values.    On the day of Discharge:  VITAL SIGNS:  Blood pressure 117/77, pulse 77, temperature 98.1 F (36.7 C), temperature source Oral, resp. rate 18, height '5\' 7"'$  (1.702 m), weight 59.058 kg (130 lb 3.2 oz), SpO2 100 %.  PHYSICAL EXAMINATION:  GENERAL:  50 y.o.-year-old patient lying in the bed with no acute distress.  EYES: Pupils equal, round, reactive to light and accommodation. No scleral icterus. Extraocular muscles intact.  HEENT: Head atraumatic, normocephalic.  Oropharynx and nasopharynx clear.  NECK:  Supple, no jugular venous distention. No thyroid enlargement, no tenderness.  LUNGS: Normal breath sounds bilaterally, no wheezing, rales,rhonchi or crepitation. No use of accessory muscles of respiration.  CARDIOVASCULAR: S1, S2 normal. No murmurs, rubs, or gallops.  ABDOMEN: Soft, non-tender, non-distended. Bowel sounds present. No organomegaly or mass.  EXTREMITIES: No pedal edema, cyanosis, or clubbing.  NEUROLOGIC: Cranial nerves II through XII are intact. Muscle strength 5/5 in all extremities. Sensation intact. Gait not checked.  PSYCHIATRIC: The patient is alert and oriented x 3.  SKIN: No obvious rash, lesion, or ulcer.  DATA REVIEW:   CBC  Recent Labs Lab 10/02/15 0431  WBC 5.6  HGB 8.8*  HCT 27.2*  PLT 114*    Chemistries   Recent Labs Lab 10/01/15 0501 10/02/15 0431  NA 133* 131*  K 3.8 3.7  CL 109 106  CO2 22 21*  GLUCOSE 124* 119*  BUN 19 9  CREATININE 0.63 0.64  CALCIUM 6.2* 6.4*  AST 20  --   ALT 22  --   ALKPHOS 53  --   BILITOT 0.8  --  Cardiac Enzymes No results for input(s): TROPONINI in the last 168 hours.  Microbiology Results  Results for orders placed or performed during the hospital encounter of 09/30/15  MRSA PCR Screening     Status: None   Collection Time: 09/30/15  8:02 PM  Result Value Ref Range Status   MRSA by PCR NEGATIVE NEGATIVE Final    Comment:        The GeneXpert MRSA Assay (FDA approved for NASAL specimens only), is one component of a comprehensive MRSA colonization surveillance program. It is not intended to diagnose MRSA infection nor to guide or monitor treatment for MRSA infections.     Follow-up Information    Follow up with Cammie Sickle, MD On 10/12/2015.   Specialties:  Internal Medicine, Oncology   Why:  at 2:30   Contact information:   [place] [id number] [phone]       Follow up with [facility], Lupita Dawn, MD On 10/29/2015.    Specialty:  Internal Medicine   Why:  at 10:00   Contact information:   [facility] [place] [address] [phone]       Management plans discussed with the patient, family and they are in agreement.  CODE STATUS: FULL CODE  TOTAL TIME TAKING CARE OF THIS PATIENT: 40 minutes.    [facility], Tyrese Capriotti M.D on 10/07/2015 at 9:48 AM  Between 7am to 6pm - Pager - [phone]  After 6pm go to [email or web address] - password [email or web address] [facility]  Office  [phone]  CC: Primary care physician; PROVIDER NOT IN SYSTEM   Note: This dictation was prepared with Dragon dictation along with smaller phrase technology. Any transcriptional errors that result from this process are unintentional.

## 2015-10-08 ENCOUNTER — Ambulatory Visit: Payer: BLUE CROSS/BLUE SHIELD

## 2015-10-08 ENCOUNTER — Ambulatory Visit
Admission: RE | Admit: 2015-10-08 | Discharge: 2015-10-08 | Disposition: A | Discharge status: Home / Self Care | Payer: BLUE CROSS/BLUE SHIELD | Source: Ambulatory Visit | Attending: Radiation Oncology | Admitting: Radiation Oncology

## 2015-10-08 DIAGNOSIS — Z510 Encounter for antineoplastic radiation therapy: Secondary | ICD-10-CM | POA: Diagnosis not present

## 2015-10-09 ENCOUNTER — Ambulatory Visit: Payer: BLUE CROSS/BLUE SHIELD

## 2015-10-09 ENCOUNTER — Ambulatory Visit
Admission: RE | Admit: 2015-10-09 | Discharge: 2015-10-09 | Disposition: A | Discharge status: Home / Self Care | Payer: BLUE CROSS/BLUE SHIELD | Source: Ambulatory Visit | Attending: Radiation Oncology | Admitting: Radiation Oncology

## 2015-10-09 DIAGNOSIS — Z510 Encounter for antineoplastic radiation therapy: Secondary | ICD-10-CM | POA: Diagnosis not present

## 2015-10-10 ENCOUNTER — Ambulatory Visit
Admission: RE | Admit: 2015-10-10 | Discharge: 2015-10-10 | Disposition: A | Discharge status: Home / Self Care | Payer: BLUE CROSS/BLUE SHIELD | Source: Ambulatory Visit | Attending: Radiation Oncology | Admitting: Radiation Oncology

## 2015-10-10 DIAGNOSIS — Z510 Encounter for antineoplastic radiation therapy: Secondary | ICD-10-CM | POA: Diagnosis not present

## 2015-10-12 ENCOUNTER — Other Ambulatory Visit: Payer: BLUE CROSS/BLUE SHIELD

## 2015-10-12 ENCOUNTER — Inpatient Hospital Stay: Payer: BLUE CROSS/BLUE SHIELD

## 2015-10-12 ENCOUNTER — Inpatient Hospital Stay (HOSPITAL_BASED_OUTPATIENT_CLINIC_OR_DEPARTMENT_OTHER): Payer: BLUE CROSS/BLUE SHIELD | Admitting: Internal Medicine

## 2015-10-12 ENCOUNTER — Telehealth: Payer: Self-pay | Admitting: Internal Medicine

## 2015-10-12 ENCOUNTER — Ambulatory Visit: Payer: BLUE CROSS/BLUE SHIELD | Admitting: Internal Medicine

## 2015-10-12 VITALS — BP 100/73 | HR 92 | Temp 96.7°F | Wt 126.8 lb

## 2015-10-12 DIAGNOSIS — Z800 Family history of malignant neoplasm of digestive organs: Secondary | ICD-10-CM | POA: Diagnosis not present

## 2015-10-12 DIAGNOSIS — Z79899 Other long term (current) drug therapy: Secondary | ICD-10-CM

## 2015-10-12 DIAGNOSIS — K259 Gastric ulcer, unspecified as acute or chronic, without hemorrhage or perforation: Secondary | ICD-10-CM | POA: Diagnosis not present

## 2015-10-12 DIAGNOSIS — R531 Weakness: Secondary | ICD-10-CM

## 2015-10-12 DIAGNOSIS — M79602 Pain in left arm: Secondary | ICD-10-CM

## 2015-10-12 DIAGNOSIS — I10 Essential (primary) hypertension: Secondary | ICD-10-CM

## 2015-10-12 DIAGNOSIS — K30 Functional dyspepsia: Secondary | ICD-10-CM | POA: Diagnosis not present

## 2015-10-12 DIAGNOSIS — C7951 Secondary malignant neoplasm of bone: Secondary | ICD-10-CM

## 2015-10-12 DIAGNOSIS — C7931 Secondary malignant neoplasm of brain: Secondary | ICD-10-CM | POA: Diagnosis not present

## 2015-10-12 DIAGNOSIS — T380X5A Adverse effect of glucocorticoids and synthetic analogues, initial encounter: Secondary | ICD-10-CM | POA: Diagnosis not present

## 2015-10-12 DIAGNOSIS — R5383 Other fatigue: Secondary | ICD-10-CM | POA: Diagnosis not present

## 2015-10-12 DIAGNOSIS — R200 Anesthesia of skin: Secondary | ICD-10-CM

## 2015-10-12 DIAGNOSIS — C7802 Secondary malignant neoplasm of left lung: Secondary | ICD-10-CM | POA: Diagnosis not present

## 2015-10-12 DIAGNOSIS — C3411 Malignant neoplasm of upper lobe, right bronchus or lung: Secondary | ICD-10-CM | POA: Diagnosis not present

## 2015-10-12 DIAGNOSIS — C3491 Malignant neoplasm of unspecified part of right bronchus or lung: Secondary | ICD-10-CM

## 2015-10-12 LAB — CBC WITH DIFFERENTIAL/PLATELET
BASOS ABS: 0 10*3/uL (ref 0–0.1)
BASOS PCT: 0 %
EOS ABS: 0 10*3/uL (ref 0–0.7)
Eosinophils Relative: 1 %
HCT: 26.3 % — ABNORMAL LOW (ref 40.0–52.0)
HEMOGLOBIN: 8.6 g/dL — AB (ref 13.0–18.0)
Lymphocytes Relative: 14 %
Lymphs Abs: 0.6 10*3/uL — ABNORMAL LOW (ref 1.0–3.6)
MCH: 25 pg — AB (ref 26.0–34.0)
MCHC: 32.7 g/dL (ref 32.0–36.0)
MCV: 76.4 fL — AB (ref 80.0–100.0)
Monocytes Absolute: 0.3 10*3/uL (ref 0.2–1.0)
Monocytes Relative: 8 %
NEUTROS ABS: 3.1 10*3/uL (ref 1.4–6.5)
NEUTROS PCT: 77 %
Platelets: 233 10*3/uL (ref 150–440)
RBC: 3.45 MIL/uL — AB (ref 4.40–5.90)
RDW: 21.2 % — ABNORMAL HIGH (ref 11.5–14.5)
WBC: 4 10*3/uL (ref 3.8–10.6)

## 2015-10-12 LAB — BASIC METABOLIC PANEL
Anion gap: 4 — ABNORMAL LOW (ref 5–15)
BUN: 15 mg/dL (ref 6–20)
CHLORIDE: 104 mmol/L (ref 101–111)
CO2: 22 mmol/L (ref 22–32)
CREATININE: 1.09 mg/dL (ref 0.61–1.24)
Calcium: 7.3 mg/dL — ABNORMAL LOW (ref 8.9–10.3)
GFR calc Af Amer: >60 mL/min (ref >60)
GFR calc non Af Amer: >60 mL/min (ref >60)
Glucose, Bld: 95 mg/dL (ref 65–99)
POTASSIUM: 3.6 mmol/L (ref 3.5–5.1)
SODIUM: 130 mmol/L — AB (ref 135–145)

## 2015-10-12 NOTE — Progress Notes (Signed)
.[facility] OFFICE PROGRESS NOTE  Patient Care Team: Provider Not In System as PCP - General   SUMMARY OF ONCOLOGIC HISTORY:  # AUG 2015-  METASTATIC ADENO CA LUNG POSITIVE for EGFR Exon 21 [id number] [neg- ALK;[name];[name],MET]; [name]; NOV [id number]- CT- Progression [RUL & Pelvic sclerotic lesions];  [name];[name]; [name]; [name]; stable lung nodule; [name]; NOV 2016- MRI BRAIN-PROGRESS-NEW multiple brain lesions [~82m]; Feb 9th MRI- subtle increase/number in size of brain lesions [~580m- asymptomatic. 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   he was recently evaluated in the hospital for hematemesis found to have a gastric ulcer. Status post EGD.   Patient denies any more hematemesis or black red stools.  He is currently off steroids. Because of fatigue.  He is on Protonix. He finishes radiation 2 days ago.   He denies any headaches.  He continues to complain of weakness and numbness left upper extremity.  Has mild pain in his left upper extremity-  One to 2 a scale of 10.  Appetite fair.   REVIEW OF SYSTEMS:  A complete 10 point review of system is done which is negative except mentioned above/history of present illness.   PAST MEDICAL HISTORY :  Past Medical History  Diagnosis Date  . HTN (hypertension)   . Brain lesion   . 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 History of radiation therapy   . Adenocarcinoma of lung ([facility]    PAST SURGICAL  HISTORY :   Past Surgical History  Procedure Laterality Date  . Laminectomy N/A 03/07/2014    Procedure: T1-T2 Laminectomy with Decompression of Cord and Removal of Cancer  thoracic one/two;  Surgeon: HeKristeen MissMD;  [facility];  Service: Neurosurgery;  Laterality: N/A;  . Ct guided biopsy  ([facility] hx)  05/28/2014  . Esophagogastroduodenoscopy N/A 10/01/2015    Procedure: ESOPHAGOGASTRODUODENOSCOPY (EGD);  Surgeon: PaHulen LusterMD;  [facility];  Service: Endoscopy;  Laterality: N/A;    FAMILY HISTORY :   Family History  Problem Relation Age of Onset  . Colon cancer Mother   . Heart disease Father   . Hypertension Sister   . Hypertension Brother     SOCIAL HISTORY:   Social History  Substance Use Topics  . Smoking status: Never Smoker   . Smokeless tobacco: Never Used  . Alcohol Use: 0.5 oz/week    1 Standard drinks or equivalent per week     Comment: none for a year    ALLERGIES:  has No Known Allergies.  MEDICATIONS:  Current Outpatient Prescriptions  Medication Sig Dispense Refill  . dexamethasone (DECADRON) 4 MG tablet Take 1 tablet (4 mg total) by mouth 2 (two) times daily. 60 tablet 0  . diphenoxylate-atropine (LOMOTIL) 2.5-0.025 MG tablet Take 1 tablet by mouth 4 (four) times daily as needed for diarrhea or loose stools (take it along with imoidum for diarrhea). 60 tablet 0  . hydrochlorothiazide (HYDRODIURIL) 25  MG tablet Take 1 tablet (25 mg total) by mouth daily. 30 tablet 5  . levETIRAcetam (KEPPRA) 500 MG tablet Take 1 tablet (500 mg total) by mouth 2 (two) times daily. 60 tablet 0  . metoprolol succinate (TOPROL-XL) 50 MG 24 hr tablet Take 1 tablet (50 mg total) by mouth daily. 30 tablet 5  . osimertinib mesylate (TAGRISSO) 80 MG tablet Take 1 tablet (80 mg total) by mouth daily. With or without food. 30 tablet 3  . oxyCODONE (OXY IR/ROXICODONE) 5 MG immediate release tablet Take 1 tablet (5 mg total) by mouth every 4 (four) hours as needed  for moderate pain or severe pain. 30 tablet 0  . pantoprazole (PROTONIX) 40 MG tablet Take 1 tablet (40 mg total) by mouth 2 (two) times daily. 60 tablet 0  . potassium chloride (K-DUR,KLOR-CON) 10 MEQ tablet Take 1 tablet (10 mEq total) by mouth 2 (two) times daily. 60 tablet 3  . prochlorperazine (COMPAZINE) 10 MG tablet Take 1 tablet (10 mg total) by mouth every 6 (six) hours as needed for nausea or vomiting. 90 tablet 1   No current facility-administered medications for this visit.    PHYSICAL EXAMINATION: ECOG PERFORMANCE STATUS: 1 - Symptomatic but completely ambulatory  BP 100/73 mmHg  Pulse 92  Temp(Src) 96.7 F (35.9 C) (Tympanic)  Wt 126 lb 12.2 oz (57.5 kg)   GENERAL: Well-nourished well-developed; Alert, no distress and comfortable.He is accompanied by his wife.  EYES: no pallor or icterus OROPHARYNX: no thrush or ulceration; good dentition  NECK: supple, no masses felt LYMPH: no palpable lymphadenopathy in the cervical, axillary or inguinal regions LUNGS: clear to auscultation and No wheeze or crackles HEART/CVS: regular rate & rhythm and no murmurs; No lower extremity edema ABDOMEN:abdomen soft, non-tender and normal bowel sounds Musculoskeletal:no cyanosis of digits and no clubbing  PSYCH: alert & oriented x 3; slow speech. NEURO: no focal motor-except for mild weakness in the left upper extremity-grasp/sensory deficits;  SKIN: no rashes or significant lesions  Filed Weights   10/12/15 1513  Weight: 126 lb 12.2 oz (57.5 kg)    LABORATORY DATA:  I have reviewed the data as listed    Component Value Date/Time   NA 130* 10/12/2015 1431   NA 141 11/08/2014 1505   K 3.6 10/12/2015 1431   K 3.7 11/08/2014 1505   CL 104 10/12/2015 1431   CL 106 11/08/2014 1505   CO2 22 10/12/2015 1431   CO2 29 11/08/2014 1505   GLUCOSE 95 10/12/2015 1431   GLUCOSE 75 11/08/2014 1505   BUN 15 10/12/2015 1431   BUN 13 11/08/2014 1505   CREATININE 1.09 10/12/2015 1431    CREATININE 1.15 11/08/2014 1505   CALCIUM 7.3* 10/12/2015 1431   CALCIUM 9.1 11/08/2014 1505   PROT 3.9* 10/01/2015 0501   PROT 7.7 11/08/2014 1505   ALBUMIN 2.0* 10/01/2015 0501   ALBUMIN 4.0 11/08/2014 1505   AST 20 10/01/2015 0501   AST 31 11/08/2014 1505   ALT 22 10/01/2015 0501   ALT 53 11/08/2014 1505   ALKPHOS 53 10/01/2015 0501   ALKPHOS 62 11/08/2014 1505   BILITOT 0.8 10/01/2015 0501   BILITOT 0.6 11/08/2014 1505   GFRNONAA >60 10/12/2015 1431   GFRNONAA >60 11/08/2014 1505   GFRNONAA >60 08/01/2014 1023   GFRAA >60 10/12/2015 1431   GFRAA >60 11/08/2014 1505   GFRAA >60 08/01/2014 1023    No results found for: SPEP, UPEP  Lab Results  Component Value Date  WBC 4.0 10/12/2015   NEUTROABS 3.1 10/12/2015   HGB 8.6* 10/12/2015   HCT 26.3* 10/12/2015   MCV 76.4* 10/12/2015   PLT 233 10/12/2015      Chemistry      Component Value Date/Time   NA 130* 10/12/2015 1431   NA 141 11/08/2014 1505   K 3.6 10/12/2015 1431   K 3.7 11/08/2014 1505   CL 104 10/12/2015 1431   CL 106 11/08/2014 1505   CO2 22 10/12/2015 1431   CO2 29 11/08/2014 1505   BUN 15 10/12/2015 1431   BUN 13 11/08/2014 1505   CREATININE 1.09 10/12/2015 1431   CREATININE 1.15 11/08/2014 1505      Component Value Date/Time   CALCIUM 7.3* 10/12/2015 1431   CALCIUM 9.1 11/08/2014 1505   ALKPHOS 53 10/01/2015 0501   ALKPHOS 62 11/08/2014 1505   AST 20 10/01/2015 0501   AST 31 11/08/2014 1505   ALT 22 10/01/2015 0501   ALT 53 11/08/2014 1505   BILITOT 0.8 10/01/2015 0501   BILITOT 0.6 11/08/2014 1505       RADIOGRAPHIC STUDIES: CT scan reviewed as discussed below  ASSESSMENT & PLAN:   # 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 Patient seems to be tolerating the medication well.  # Thoracic/cervical spine metastases- Status post radiation.  Left upper extremity weakness stable  #  Brain metastases-  Status post brain radiation.  Currently off dexamethasone.  #  Gastric ulcer-  Status post EGD.  Currently resolved.  #  Bone metastases hold Xgeva-  Calcium 7.3. Recommend calcium and vitamin D.  #  Patient will follow-up with me in 4 weeks/ CT of the chest and also thoracic and cervical spine MRI.  If there is any significant progression of the disease I would recommend switching to chemotherapy.  Given the patient's borderline performance status-  Continue  Tagrisso for now.   # 25 minutes face-to-face with the patient discussing the above plan of care; more than 50% of time spent on prognosis/ natural history; counseling and coordination.      Cammie Sickle, MD 10/12/2015 3:20 PM

## 2015-10-12 NOTE — Telephone Encounter (Signed)
Calcium 7.3;  Recommend calcium and vitamin D pill  Twice a day. Please inform pt-

## 2015-10-15 NOTE — Telephone Encounter (Signed)
Called patient and left message that calcium is 7.3.  MD recommends calcium and Vitamin D tablet.  Patient instructed to take 2 per day.

## 2015-10-24 ENCOUNTER — Inpatient Hospital Stay: Payer: BLUE CROSS/BLUE SHIELD | Attending: Internal Medicine | Admitting: Internal Medicine

## 2015-10-24 ENCOUNTER — Other Ambulatory Visit: Payer: Self-pay | Admitting: Internal Medicine

## 2015-10-24 VITALS — BP 103/69 | HR 90 | Temp 97.4°F | Resp 18 | Wt 124.3 lb

## 2015-10-24 DIAGNOSIS — Z79899 Other long term (current) drug therapy: Secondary | ICD-10-CM | POA: Insufficient documentation

## 2015-10-24 DIAGNOSIS — K254 Chronic or unspecified gastric ulcer with hemorrhage: Secondary | ICD-10-CM | POA: Insufficient documentation

## 2015-10-24 DIAGNOSIS — C7802 Secondary malignant neoplasm of left lung: Secondary | ICD-10-CM | POA: Insufficient documentation

## 2015-10-24 DIAGNOSIS — Z800 Family history of malignant neoplasm of digestive organs: Secondary | ICD-10-CM | POA: Diagnosis not present

## 2015-10-24 DIAGNOSIS — M25512 Pain in left shoulder: Secondary | ICD-10-CM

## 2015-10-24 DIAGNOSIS — C3492 Malignant neoplasm of unspecified part of left bronchus or lung: Secondary | ICD-10-CM

## 2015-10-24 DIAGNOSIS — I10 Essential (primary) hypertension: Secondary | ICD-10-CM | POA: Diagnosis not present

## 2015-10-24 DIAGNOSIS — Z923 Personal history of irradiation: Secondary | ICD-10-CM | POA: Diagnosis not present

## 2015-10-24 DIAGNOSIS — R5383 Other fatigue: Secondary | ICD-10-CM | POA: Diagnosis not present

## 2015-10-24 DIAGNOSIS — C7931 Secondary malignant neoplasm of brain: Secondary | ICD-10-CM | POA: Diagnosis not present

## 2015-10-24 DIAGNOSIS — Z7952 Long term (current) use of systemic steroids: Secondary | ICD-10-CM | POA: Insufficient documentation

## 2015-10-24 DIAGNOSIS — C3411 Malignant neoplasm of upper lobe, right bronchus or lung: Secondary | ICD-10-CM | POA: Insufficient documentation

## 2015-10-24 DIAGNOSIS — R531 Weakness: Secondary | ICD-10-CM | POA: Insufficient documentation

## 2015-10-24 DIAGNOSIS — C7951 Secondary malignant neoplasm of bone: Secondary | ICD-10-CM | POA: Diagnosis not present

## 2015-10-24 MED ORDER — OXYCODONE HCL 5 MG PO TABS: 5.0000 mg | ORAL_TABLET | Freq: Four times a day (QID) | ORAL | Status: DC | PRN

## 2015-10-24 NOTE — Progress Notes (Signed)
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 He continues to have weakness in the left upper extremity which is stable-with any better or worse. The pain has been worse in the last 3-4 days. Patient has been taking ibuprofen without any significant improvement.   He has not had any other episodes of hematemesis  He is currently off steroids. He continues to complain of fatigue.   He denies any headaches.  He continues to complain of weakness and numbness left upper extremity. Denies any weakness of his lower extremities or loss of bladder control.   REVIEW OF SYSTEMS:  A complete 10 point review of system is done which is negative except mentioned above/history of present illness.   PAST MEDICAL HISTORY :  Past Medical History  Diagnosis Date  . HTN (hypertension)   . Brain lesion   . Thoracic spine tumor  02/2014  . History of radiation therapy   . [place]en[place] of lung [place]    PAST SURGICAL HISTORY :   Past Surgical History  Procedure Laterality Date  . Laminectomy N/A 03/07/2014    Procedure: T1-T2 Laminectomy with Decompression of Cord and Removal of Cancer  thoracic one/two;  Surgeon: HeKristeen MissMD;  [facility];  Service: Neurosurgery;  Laterality: N/A;  . Ct guided biopsy  ([facility] hx)  05/28/2014  . Esophagogastroduodenoscopy N/A 10/01/2015    Procedure: ESOPHAGOGASTRODUODENOSCOPY (EGD);  Surgeon: PaHulen LusterMD;  [facility];  Service: Endoscopy;  Laterality: N/A;    FAMILY HISTORY :   Family History  Problem Relation Age of Onset  . Colon cancer Mother   . Heart disease Father   . Hypertension Sister   . Hypertension Brother     SOCIAL HISTORY:   Social History  Substance Use Topics  . Smoking status: Never Smoker   . Smokeless tobacco: Never Used  . Alcohol Use: 0.5 oz/week    1 Standard drinks or equivalent per week     Comment: none for a year    ALLERGIES:  has No Known Allergies.  MEDICATIONS:  Current Outpatient Prescriptions  Medication Sig Dispense Refill  . diphenoxylate-atropine (LOMOTIL) 2.5-0.025 MG tablet Take 1 tablet by mouth 4 (four) times daily as needed for diarrhea or loose stools (take it along with imoidum for diarrhea). 60 tablet 0  . osimertinib mesylate (TAGRISSO) 80 MG tablet Take 1 tablet (80 mg total) by mouth daily.  With or without food. 30 tablet 3  . pantoprazole (PROTONIX) 40 MG tablet Take 1 tablet (40 mg total) by mouth 2 (two) times daily. 60 tablet 0  . potassium chloride (K-DUR,KLOR-CON) 10 MEQ tablet Take 1 tablet (10 mEq total) by mouth 2 (two) times daily. 60 tablet 3  . hydrochlorothiazide (HYDRODIURIL) 25 MG tablet Take 1 tablet (25 mg total) by mouth daily. (Patient not taking: Reported on 10/24/2015) 30 tablet 5  . levETIRAcetam (KEPPRA) 500 MG tablet Take 1 tablet (500 mg total) by mouth 2  (two) times daily. (Patient not taking: Reported on 10/24/2015) 60 tablet 0  . metoprolol succinate (TOPROL-XL) 50 MG 24 hr tablet Take 1 tablet (50 mg total) by mouth daily. (Patient not taking: Reported on 10/24/2015) 30 tablet 5  . oxyCODONE (OXY IR/ROXICODONE) 5 MG immediate release tablet Take 1 tablet (5 mg total) by mouth every 4 (four) hours as needed for moderate pain or severe pain. (Patient not taking: Reported on 10/24/2015) 30 tablet 0   No current facility-administered medications for this visit.    PHYSICAL EXAMINATION: ECOG PERFORMANCE STATUS: 1 - Symptomatic but completely ambulatory  BP 103/69 mmHg  Pulse 90  Temp(Src) 97.4 F (36.3 C) (Tympanic)  Resp 18  Wt 124 lb 5.4 oz (56.4 kg)   GENERAL: Moderately built moderately nourished . Alert, no distress and comfortable.He is accompanied by his wife.  EYES: no pallor or icterus OROPHARYNX: no thrush or ulceration; good dentition  NECK: supple, no masses felt LYMPH: no palpable lymphadenopathy in the cervical, axillary or inguinal regions LUNGS: clear to auscultation and No wheeze or crackles HEART/CVS: regular rate & rhythm and no murmurs; No lower extremity edema ABDOMEN:abdomen soft, non-tender and normal bowel sounds Musculoskeletal:no cyanosis of digits and no clubbing; Mild swelling noted in the left scapular region. No exquisite tenderness. PSYCH: alert & oriented x 3; slow speech. NEURO: no focal motor-except for mild weakness in the left upper extremity-grasp/sensory deficits;  SKIN: no rashes or significant lesions  Filed Weights   10/24/15 1420  Weight: 124 lb 5.4 oz (56.4 kg)    LABORATORY DATA:  I have reviewed the data as listed    Component Value Date/Time   NA 130* 10/12/2015 1431   NA 141 11/08/2014 1505   K 3.6 10/12/2015 1431   K 3.7 11/08/2014 1505   CL 104 10/12/2015 1431   CL 106 11/08/2014 1505   CO2 22 10/12/2015 1431   CO2 29 11/08/2014 1505   GLUCOSE 95 10/12/2015 1431    GLUCOSE 75 11/08/2014 1505   BUN 15 10/12/2015 1431   BUN 13 11/08/2014 1505   CREATININE 1.09 10/12/2015 1431   CREATININE 1.15 11/08/2014 1505   CALCIUM 7.3* 10/12/2015 1431   CALCIUM 9.1 11/08/2014 1505   PROT 3.9* 10/01/2015 0501   PROT 7.7 11/08/2014 1505   ALBUMIN 2.0* 10/01/2015 0501   ALBUMIN 4.0 11/08/2014 1505   AST 20 10/01/2015 0501   AST 31 11/08/2014 1505   ALT 22 10/01/2015 0501   ALT 53 11/08/2014 1505   ALKPHOS 53 10/01/2015 0501   ALKPHOS 62 11/08/2014 1505   BILITOT 0.8 10/01/2015 0501   BILITOT 0.6 11/08/2014 1505   GFRNONAA >60 10/12/2015 1431   GFRNONAA >60 11/08/2014 1505   GFRNONAA >60 08/01/2014 1023   GFRAA >60 10/12/2015 1431   GFRAA >60 11/08/2014 1505   GFRAA >60 08/01/2014 1023    No results found for: SPEP, UPEP  Lab Results  Component Value Date  WBC 4.0 10/12/2015   NEUTROABS 3.1 10/12/2015   HGB 8.6* 10/12/2015   HCT 26.3* 10/12/2015   MCV 76.4* 10/12/2015   PLT 233 10/12/2015      Chemistry      Component Value Date/Time   NA 130* 10/12/2015 1431   NA 141 11/08/2014 1505   K 3.6 10/12/2015 1431   K 3.7 11/08/2014 1505   CL 104 10/12/2015 1431   CL 106 11/08/2014 1505   CO2 22 10/12/2015 1431   CO2 29 11/08/2014 1505   BUN 15 10/12/2015 1431   BUN 13 11/08/2014 1505   CREATININE 1.09 10/12/2015 1431   CREATININE 1.15 11/08/2014 1505      Component Value Date/Time   CALCIUM 7.3* 10/12/2015 1431   CALCIUM 9.1 11/08/2014 1505   ALKPHOS 53 10/01/2015 0501   ALKPHOS 62 11/08/2014 1505   AST 20 10/01/2015 0501   AST 31 11/08/2014 1505   ALT 22 10/01/2015 0501   ALT 53 11/08/2014 1505   BILITOT 0.8 10/01/2015 0501   BILITOT 0.6 11/08/2014 1505       ASSESSMENT & PLAN:   # Metastatic adenocarcinoma of the lung with EGFR mutation-positive for T790 M mutation/currently on THIRD LINE therapy with Tagrisso [Feb 9th 2017]. Given the worsening pain in the left shoulder blade area and concerned about clinical progression.  Recommend CT chest/MRI thoracic cervical spine as soon as possible.  # Discussed given the concerns for progression that I would recommend chemotherapy with carboplatin and Alimta every 3 weeks. Discussed the potential side effects including but not limited to-increasing fatigue, nausea vomiting, diarrhea, hair loss, sores in the mouth, increase risk of infection and also neuropathy.   #  Thoracic/cervical spine metastases- Status post radiation- Worsening pain question progression. Recommend oxycodone new prescription given.  # Brain metastases-  Status post brain radiation.  Currently off dexamethasone.   #  Gastric ulcer/hematemesis-  Status post EGD.  Currently resolved.  #  Bone metastases hold Xgeva-  Calcium 7.3.   # patient and wife were reluctant to proceed with chemotherapy. Discussed with them that metastatic lung cancer to left expectancy is around one to 2 years especially with brain metastases. Unfortunately patient progressing on current therapy- the only next possible option is chemotherapy. They understand all treatments are palliative.     Cammie Sickle, MD 10/24/2015 2:34 PM

## 2015-10-24 NOTE — Progress Notes (Signed)
Patient acute add on today for left shoulder pain and lower extremity weakness.  Patient also c/o sore throat and cough.

## 2015-10-24 NOTE — Progress Notes (Signed)
Mr. Sian presented as an acute add on/walk in to cancer center clinic at 130pm today. He is very weak in bilateral lower extremities. He c/o left shoulder pain that radiates to upper back and spine that has worsen over the last 3-4 days.

## 2015-10-30 ENCOUNTER — Ambulatory Visit
Admission: RE | Admit: 2015-10-30 | Discharge: 2015-10-30 | Disposition: A | Discharge status: Home / Self Care | Payer: BLUE CROSS/BLUE SHIELD | Source: Ambulatory Visit | Attending: Internal Medicine | Admitting: Internal Medicine

## 2015-10-30 DIAGNOSIS — C3492 Malignant neoplasm of unspecified part of left bronchus or lung: Secondary | ICD-10-CM

## 2015-10-30 DIAGNOSIS — M8448XA Pathological fracture, other site, initial encounter for fracture: Secondary | ICD-10-CM | POA: Diagnosis not present

## 2015-10-30 DIAGNOSIS — M47812 Spondylosis without myelopathy or radiculopathy, cervical region: Secondary | ICD-10-CM | POA: Diagnosis not present

## 2015-10-30 DIAGNOSIS — C7951 Secondary malignant neoplasm of bone: Secondary | ICD-10-CM | POA: Insufficient documentation

## 2015-10-30 DIAGNOSIS — M25512 Pain in left shoulder: Secondary | ICD-10-CM

## 2015-10-30 MED ORDER — GADOBENATE DIMEGLUMINE 529 MG/ML IV SOLN
10.0000 mL | Freq: Once | INTRAVENOUS | Status: AC | PRN
  Administered 2015-10-30: 10 mL via INTRAVENOUS

## 2015-10-31 ENCOUNTER — Inpatient Hospital Stay: Payer: BLUE CROSS/BLUE SHIELD

## 2015-10-31 ENCOUNTER — Encounter: Payer: Self-pay | Admitting: Internal Medicine

## 2015-10-31 ENCOUNTER — Inpatient Hospital Stay (HOSPITAL_BASED_OUTPATIENT_CLINIC_OR_DEPARTMENT_OTHER): Payer: BLUE CROSS/BLUE SHIELD | Admitting: Internal Medicine

## 2015-10-31 ENCOUNTER — Other Ambulatory Visit: Payer: Self-pay | Admitting: Internal Medicine

## 2015-10-31 ENCOUNTER — Telehealth: Payer: Self-pay | Admitting: *Deleted

## 2015-10-31 ENCOUNTER — Ambulatory Visit
Admission: RE | Admit: 2015-10-31 | Discharge: 2015-10-31 | Disposition: A | Discharge status: Home / Self Care | Payer: BLUE CROSS/BLUE SHIELD | Source: Ambulatory Visit | Attending: Internal Medicine | Admitting: Internal Medicine

## 2015-10-31 VITALS — BP 107/75 | HR 80 | Temp 95.7°F | Resp 18 | Ht 67.0 in | Wt 121.7 lb

## 2015-10-31 DIAGNOSIS — J181 Lobar pneumonia, unspecified organism: Principal | ICD-10-CM

## 2015-10-31 DIAGNOSIS — C3491 Malignant neoplasm of unspecified part of right bronchus or lung: Secondary | ICD-10-CM | POA: Diagnosis not present

## 2015-10-31 DIAGNOSIS — C7951 Secondary malignant neoplasm of bone: Secondary | ICD-10-CM | POA: Diagnosis not present

## 2015-10-31 DIAGNOSIS — R531 Weakness: Secondary | ICD-10-CM

## 2015-10-31 DIAGNOSIS — M25512 Pain in left shoulder: Secondary | ICD-10-CM

## 2015-10-31 DIAGNOSIS — C7802 Secondary malignant neoplasm of left lung: Secondary | ICD-10-CM

## 2015-10-31 DIAGNOSIS — C3492 Malignant neoplasm of unspecified part of left bronchus or lung: Secondary | ICD-10-CM

## 2015-10-31 DIAGNOSIS — Z923 Personal history of irradiation: Secondary | ICD-10-CM | POA: Diagnosis not present

## 2015-10-31 DIAGNOSIS — C7931 Secondary malignant neoplasm of brain: Secondary | ICD-10-CM

## 2015-10-31 DIAGNOSIS — M4854XA Collapsed vertebra, not elsewhere classified, thoracic region, initial encounter for fracture: Secondary | ICD-10-CM | POA: Insufficient documentation

## 2015-10-31 DIAGNOSIS — J189 Pneumonia, unspecified organism: Secondary | ICD-10-CM

## 2015-10-31 DIAGNOSIS — R5383 Other fatigue: Secondary | ICD-10-CM

## 2015-10-31 DIAGNOSIS — Z7952 Long term (current) use of systemic steroids: Secondary | ICD-10-CM

## 2015-10-31 DIAGNOSIS — J700 Acute pulmonary manifestations due to radiation: Secondary | ICD-10-CM

## 2015-10-31 DIAGNOSIS — Z79899 Other long term (current) drug therapy: Secondary | ICD-10-CM

## 2015-10-31 DIAGNOSIS — I10 Essential (primary) hypertension: Secondary | ICD-10-CM

## 2015-10-31 DIAGNOSIS — C3411 Malignant neoplasm of upper lobe, right bronchus or lung: Secondary | ICD-10-CM | POA: Diagnosis not present

## 2015-10-31 DIAGNOSIS — K254 Chronic or unspecified gastric ulcer with hemorrhage: Secondary | ICD-10-CM

## 2015-10-31 LAB — COMPREHENSIVE METABOLIC PANEL
ALK PHOS: 69 U/L (ref 38–126)
ALT: 11 U/L — ABNORMAL LOW (ref 17–63)
ANION GAP: 8 (ref 5–15)
AST: 17 U/L (ref 15–41)
Albumin: 3.4 g/dL — ABNORMAL LOW (ref 3.5–5.0)
BILIRUBIN TOTAL: 0.3 mg/dL (ref 0.3–1.2)
BUN: 18 mg/dL (ref 6–20)
CALCIUM: 8.3 mg/dL — AB (ref 8.9–10.3)
CO2: 28 mmol/L (ref 22–32)
Chloride: 94 mmol/L — ABNORMAL LOW (ref 101–111)
Creatinine, Ser: 1.17 mg/dL (ref 0.61–1.24)
GFR calc non Af Amer: >60 mL/min (ref >60)
Glucose, Bld: 88 mg/dL (ref 65–99)
Potassium: 3.2 mmol/L — ABNORMAL LOW (ref 3.5–5.1)
SODIUM: 130 mmol/L — AB (ref 135–145)
TOTAL PROTEIN: 8.3 g/dL — AB (ref 6.5–8.1)

## 2015-10-31 LAB — CBC WITH DIFFERENTIAL/PLATELET
Basophils Absolute: 0 10*3/uL (ref 0–0.1)
Basophils Relative: 1 %
EOS ABS: 0.3 10*3/uL (ref 0–0.7)
Eosinophils Relative: 5 %
HEMATOCRIT: 30.4 % — AB (ref 40.0–52.0)
HEMOGLOBIN: 9.8 g/dL — AB (ref 13.0–18.0)
LYMPHS ABS: 1.3 10*3/uL (ref 1.0–3.6)
Lymphocytes Relative: 18 %
MCH: 23.2 pg — AB (ref 26.0–34.0)
MCHC: 32.3 g/dL (ref 32.0–36.0)
MCV: 71.8 fL — ABNORMAL LOW (ref 80.0–100.0)
MONO ABS: 1 10*3/uL (ref 0.2–1.0)
MONOS PCT: 14 %
NEUTROS PCT: 64 %
Neutro Abs: 4.6 10*3/uL (ref 1.4–6.5)
Platelets: 468 10*3/uL — ABNORMAL HIGH (ref 150–440)
RBC: 4.23 MIL/uL — ABNORMAL LOW (ref 4.40–5.90)
RDW: 21.3 % — ABNORMAL HIGH (ref 11.5–14.5)
WBC: 7.3 10*3/uL (ref 3.8–10.6)

## 2015-10-31 MED ORDER — PREDNISONE 20 MG PO TABS: ORAL_TABLET | ORAL | Status: DC

## 2015-10-31 MED ORDER — IOPAMIDOL (ISOVUE-300) INJECTION 61%
75.0000 mL | Freq: Once | INTRAVENOUS | Status: AC | PRN
  Administered 2015-10-31: 75 mL via INTRAVENOUS

## 2015-10-31 MED ORDER — CEPHALEXIN 500 MG PO CAPS: 500.0000 mg | ORAL_CAPSULE | Freq: Three times a day (TID) | ORAL | Status: DC

## 2015-10-31 NOTE — Progress Notes (Signed)
.[facility] OFFICE PROGRESS NOTE  Patient Care Team: Provider Not In System as PCP - General   SUMMARY OF ONCOLOGIC HISTORY:  # AUG 2015-  METASTATIC ADENO CA LUNG POSITIVE for EGFR Exon 21 L858R [neg- ALK;[name];[name],MET]; [name]; NOV [id number]- CT- Progression [RUL & Pelvic sclerotic lesions];  [name];[name]; [name]; [name]; stable lung nodule; increasing L1 [asymptomatic];   # Feb 9th 2017- START Arta Silence [place] sec to [place]]  #  April 2017- No disease in chest; LUL- radiatio changes; START  prednsione  # AUG 2015- 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  Patient continues to have pain in the left posterior shoulder blade area. He continues to have weakness of the left upper extremity which is not getting any worse; at the same time not any significant better. Patient has been taking oxycodone up to 3 day.  He has not had any other episodes of hematemesis  He is currently off steroids. Continues to be on Protonix. He continues to complain of fatigue.   REVIEW OF SYSTEMS:  A complete 10 point review of system is done which is negative except mentioned above/history of present illness.   PAST MEDICAL HISTORY :  Past Medical History  Diagnosis Date  . HTN (hypertension)   . Brain lesion   .  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 History of radiation therapy   . [place] of [facility] [place]    PAST SURGICAL HISTORY :   Past Surgical History  Procedure Laterality Date  . Laminectomy N/A 03/07/2014    Procedure: T1-T2 Laminectomy with Decompression of Cord and Removal of Cancer  thoracic one/two;  Surgeon: HeKristeen MissMD;  [facility];  Service: Neurosurgery;  Laterality: N/A;  . Ct guided biopsy  ([facility] hx)  05/28/2014  . Esophagogastroduodenoscopy N/A 10/01/2015    Procedure: ESOPHAGOGASTRODUODENOSCOPY (EGD);  Surgeon: PaHulen LusterMD;  [facility];  Service: Endoscopy;  Laterality: N/A;    FAMILY HISTORY :   Family History  Problem Relation Age of Onset  . Colon cancer Mother   . Heart disease Father   . Hypertension Sister   . Hypertension Brother     SOCIAL HISTORY:   Social History  Substance Use Topics  . Smoking status: Never Smoker   . Smokeless tobacco: Never Used  . Alcohol Use: 0.5 oz/week    1 Standard drinks or equivalent per week     Comment: none for a year    ALLERGIES:  has No Known Allergies.  MEDICATIONS:  Current Outpatient Prescriptions  Medication Sig Dispense Refill  . diphenoxylate-atropine (LOMOTIL) 2.5-0.025 MG tablet Take 1 tablet by mouth 4 (four) times daily as needed for diarrhea or loose stools (take it along with imoidum for diarrhea). 60 tablet 0  . guaiFENesin (MUCINEX) 600 MG 12 hr tablet Take 600  mg by mouth as needed for cough.    Marland Kitchen osimertinib mesylate (TAGRISSO) 80 MG tablet Take 1 tablet (80 mg total) by mouth daily. With or without food. 30 tablet 3  . oxyCODONE (OXY IR/ROXICODONE) 5 MG immediate release tablet Take 1 tablet (5 mg total) by mouth every 6 (six) hours as needed for moderate pain or severe pain. 60 tablet 0  . pantoprazole (PROTONIX) 40 MG tablet Take 1 tablet (40 mg total) by mouth 2 (two) times daily. 60 tablet 0  . potassium chloride (K-DUR,KLOR-CON) 10 MEQ tablet Take 1 tablet (10  mEq total) by mouth 2 (two) times daily. 60 tablet 3  . cephALEXin (KEFLEX) 500 MG capsule Take 1 capsule (500 mg total) by mouth 3 (three) times daily. (Patient not taking: Reported on 10/31/2015) 30 capsule 0  . hydrochlorothiazide (HYDRODIURIL) 25 MG tablet Take 1 tablet (25 mg total) by mouth daily. (Patient not taking: Reported on 10/31/2015) 30 tablet 5  . levETIRAcetam (KEPPRA) 500 MG tablet Take 1 tablet (500 mg total) by mouth 2 (two) times daily. (Patient not taking: Reported on 10/24/2015) 60 tablet 0  . lisinopril (PRINIVIL,ZESTRIL) 40 MG tablet Take 1 tablet by mouth daily. Reported on 10/31/2015  2  . metoprolol succinate (TOPROL-XL) 50 MG 24 hr tablet Take 1 tablet (50 mg total) by mouth daily. (Patient not taking: Reported on 10/24/2015) 30 tablet 5  . predniSONE (DELTASONE) 20 MG tablet Take 3 pills once a day  x 1 week; and then 2 pills a day x1 week; and then 1 pill a day; do not stop until directed. Take with food (Patient not taking: Reported on 10/31/2015) 60 tablet 0   No current facility-administered medications for this visit.    PHYSICAL EXAMINATION: ECOG PERFORMANCE STATUS: 1 - Symptomatic but completely ambulatory  BP 107/75 mmHg  Pulse 80  Temp(Src) 95.7 F (35.4 C)  Resp 18  Ht _0  (1.702 m)  Wt 121 lb 11.1 oz (55.2 kg)  BMI 19.06 kg/m2  SpO2 100%   GENERAL: Moderately built moderately nourished . Alert, no distress and comfortable.He is Alone. EYES: no pallor or icterus OROPHARYNX: no thrush or ulceration; good dentition  NECK: supple, no masses felt LYMPH: no palpable lymphadenopathy in the cervical, axillary or inguinal regions LUNGS: clear to auscultation and No wheeze or crackles HEART/CVS: regular rate & rhythm and no murmurs; No lower extremity edema ABDOMEN:abdomen soft, non-tender and normal bowel sounds Musculoskeletal:no cyanosis of digits and no clubbing; Mild swelling noted in the left scapular region. No exquisite tenderness. PSYCH: alert &  oriented x 3; slow speech. NEURO: no focal motor-except for mild weakness in the left upper extremity-grasp/sensory deficits;  SKIN: no rashes or significant lesions  Filed Weights   10/31/15 1444  Weight: 121 lb 11.1 oz (55.2 kg)    LABORATORY DATA:  I have reviewed the data as listed    Component Value Date/Time   NA 130* 10/31/2015 1423   NA 141 11/08/2014 1505   K 3.2* 10/31/2015 1423   K 3.7 11/08/2014 1505   CL 94* 10/31/2015 1423   CL 106 11/08/2014 1505   CO2 28 10/31/2015 1423   CO2 29 11/08/2014 1505   GLUCOSE 88 10/31/2015 1423   GLUCOSE 75 11/08/2014 1505   BUN 18 10/31/2015 1423   BUN 13 11/08/2014 1505   CREATININE 1.17 10/31/2015 1423   CREATININE 1.15 11/08/2014 1505   CALCIUM 8.3* 10/31/2015 1423   CALCIUM 9.1 11/08/2014 1505   PROT 8.3*  10/31/2015 1423   PROT 7.7 11/08/2014 1505   ALBUMIN 3.4* 10/31/2015 1423   ALBUMIN 4.0 11/08/2014 1505   AST 17 10/31/2015 1423   AST 31 11/08/2014 1505   ALT 11* 10/31/2015 1423   ALT 53 11/08/2014 1505   ALKPHOS 69 10/31/2015 1423   ALKPHOS 62 11/08/2014 1505   BILITOT 0.3 10/31/2015 1423   BILITOT 0.6 11/08/2014 1505   GFRNONAA >60 10/31/2015 1423   GFRNONAA >60 11/08/2014 1505   GFRNONAA >60 08/01/2014 1023   GFRAA >60 10/31/2015 1423   GFRAA >60 11/08/2014 1505   GFRAA >60 08/01/2014 1023    No results found for: SPEP, UPEP  Lab Results  Component Value Date   WBC 7.3 10/31/2015   NEUTROABS 4.6 10/31/2015   HGB 9.8* 10/31/2015   HCT 30.4* 10/31/2015   MCV 71.8* 10/31/2015   PLT 468* 10/31/2015      Chemistry      Component Value Date/Time   NA 130* 10/31/2015 1423   NA 141 11/08/2014 1505   K 3.2* 10/31/2015 1423   K 3.7 11/08/2014 1505   CL 94* 10/31/2015 1423   CL 106 11/08/2014 1505   CO2 28 10/31/2015 1423   CO2 29 11/08/2014 1505   BUN 18 10/31/2015 1423   BUN 13 11/08/2014 1505   CREATININE 1.17 10/31/2015 1423   CREATININE 1.15 11/08/2014 1505      Component Value Date/Time    CALCIUM 8.3* 10/31/2015 1423   CALCIUM 9.1 11/08/2014 1505   ALKPHOS 69 10/31/2015 1423   ALKPHOS 62 11/08/2014 1505   AST 17 10/31/2015 1423   AST 31 11/08/2014 1505   ALT 11* 10/31/2015 1423   ALT 53 11/08/2014 1505   BILITOT 0.3 10/31/2015 1423   BILITOT 0.6 11/08/2014 1505       ASSESSMENT & PLAN:   # Metastatic adenocarcinoma of the lung with EGFR mutation-positive for T790 M mutation/currently on THIRD LINE therapy with Tagrisso [Feb 9th 2017]. CT chest- left upper lobe inflammation noted [C discussion below]; the left upper lobe mass-  Resolved; bilateral approximately 1 cm supraclavicular adenopathy noted. CT cervical spine/thoracic spine shows stable disease. For now I recommend continued Tagrisso.  # Left upper lobe infiltrate- clinically suggestive of radiation pneumonitis. Patient started on prednisone 60 mg for 1 week; and then 40 mg once a week; also treated with Keflex for possible underlying bacterial etiology.  #  Thoracic/cervical spine metastases- Status post radiation- stable on recent MRI. Left shoulder pain secondary to radiation pneumonitis. Continue oxycodone for now.   # Brain metastases-  Status post brain radiation.  Currently off dexamethasone.   #  Gastric ulcer/hematemesis-  Status post EGD.  Currently resolved. Recommended continued to be on Protonix twice a day.  # Patient will follow-up with me in 2 weeks with CBC BMP.      Cammie Sickle, MD 10/31/2015 3:01 PM

## 2015-10-31 NOTE — Telephone Encounter (Signed)
-----   Message from Cammie Sickle, MD sent at 10/31/2015  8:55 AM EDT ----- MRI of the thoracic spine incidentally picked left upper lobe consolidation- concerning for radiation pneumonitis versus lobar pneumonia. Start the patient on prednisone/also Keflex. Patient is having a CT of the chest today.   Laquilla Dault please inform patient- of the above prescriptions. We can have him come in the afternoon/after the CT scan to go over the reports/plan. Also order cbc/cmp/crp with todays' visit. Thx

## 2015-10-31 NOTE — Telephone Encounter (Signed)
Called and spoke with patient. I explained to patient that he may have an infection in his lungs. I explained to patient that the doctor wants him to pick a prescription for steroids and antibiotics. Md would also like to see the doctor and have labs after his ct scan. I asked pt to come to cancer center immediately after the scan.  Pt states that he is not able to pick up the medication at this moment. I told him that this fine.  However, he needs to pick up the prescription today and start on the medication. Pt had difficulty repeating this information back to me due to patient confusion. He stated that he "didn't know I had to be on an antibiotics." I explained & reiterated to Luis [name] that Dr. B just received the results of his scans and the scan demonstrates a possible infection of his lungs. I asked pt again to come to the cancer center to discuss these results.  I reiterated plan multiple times during the conversation.  Teach back process performed with patient.

## 2015-11-02 ENCOUNTER — Inpatient Hospital Stay: Payer: BLUE CROSS/BLUE SHIELD | Admitting: Internal Medicine

## 2015-11-02 ENCOUNTER — Inpatient Hospital Stay: Payer: BLUE CROSS/BLUE SHIELD

## 2015-11-05 ENCOUNTER — Ambulatory Visit
Admission: RE | Admit: 2015-11-05 | Discharge: 2015-11-05 | Disposition: A | Discharge status: Home / Self Care | Payer: BLUE CROSS/BLUE SHIELD | Source: Ambulatory Visit | Attending: Radiation Oncology | Admitting: Radiation Oncology

## 2015-11-05 ENCOUNTER — Encounter: Payer: Self-pay | Admitting: Radiation Oncology

## 2015-11-05 ENCOUNTER — Ambulatory Visit: Payer: BLUE CROSS/BLUE SHIELD

## 2015-11-05 ENCOUNTER — Other Ambulatory Visit: Payer: BLUE CROSS/BLUE SHIELD

## 2015-11-05 VITALS — BP 114/77 | HR 67 | Temp 95.2°F | Resp 20 | Wt 122.0 lb

## 2015-11-05 DIAGNOSIS — C7951 Secondary malignant neoplasm of bone: Secondary | ICD-10-CM

## 2015-11-05 DIAGNOSIS — C7931 Secondary malignant neoplasm of brain: Secondary | ICD-10-CM

## 2015-11-05 NOTE — Progress Notes (Signed)
Radiation Oncology Follow up Note  Name: Luis [name]   Date:   11/05/2015 MRN:  [id number] DOB: 07-01-1966    This 50 y.o. male presents to the clinic today for or stage IV adenocarcinoma the lung with brain metastasis and palliative radiation therapy. To right lung, brain and cervical spine  REFERRING PROVIDER: Kristeen Miss, MD  HPI: Patient is an unfortunate 50 year old male having received multiple palliative radiation courses of treatment to his brain 2 cervical spine and right lung for stage IV non-small cell lung cancer.Marland Kitchen He is currently on targeted therapy with Tagrisso. Repeat CT scan of his chest shows stable disease in his right lung. He has some changes of left upper lobe consistent with radiation pneumonitis. He's been started on a tapered course of prednisone for that as well as Keflex. He has no headaches or change in neurologic status. He still has some left shoulder pain which may be related to his radiation pneumonitis. He specifically denies cough hemoptysis or chest tightness no focal motor or sensory levels are described.  COMPLICATIONS OF TREATMENT: none  FOLLOW UP COMPLIANCE: keeps appointments   PHYSICAL EXAM:  BP 114/77 mmHg  Pulse 67  Temp(Src) 95.2 F (35.1 C)  Resp 20  Wt 122 lb 0.4 oz (55.35 kg) Thin slightly contacting male in NAD. No change in neurologic status. Well-developed well-nourished patient in NAD. HEENT reveals PERLA, EOMI, discs not visualized.  Oral cavity is clear. No oral mucosal lesions are identified. Neck is clear without evidence of cervical or supraclavicular adenopathy. Lungs are clear to A&P. Cardiac examination is essentially unremarkable with regular rate and rhythm without murmur rub or thrill. Abdomen is benign with no organomegaly or masses noted. Motor sensory and DTR levels are equal and symmetric in the upper and lower extremities. Cranial nerves II through XII are grossly intact. Proprioception is intact. No peripheral adenopathy or  edema is identified. No motor or sensory levels are noted. Crude visual fields are within normal range.  RADIOLOGY RESULTS: Most recent CT scans are reviewed. MRI scans of cervical and thoracic spine confirm widespread bony metastatic disease with pathologic fractures involving T2-T3 T5 T11 and L2 which are unchanged no evidence of cord compression noted.  PLAN: At this time based on his widespread metastatic disease I will turn follow-up care over to medical oncology. We'll be happy to reevaluate the patient any time should further palliative treatment be indicated. Patient is to call with any concerns.  I would like to take this opportunity for allowing me to participate in the care of your patient.Armstead Peaks., MD

## 2015-11-08 ENCOUNTER — Other Ambulatory Visit: Payer: BLUE CROSS/BLUE SHIELD

## 2015-11-08 ENCOUNTER — Ambulatory Visit: Payer: BLUE CROSS/BLUE SHIELD

## 2015-11-08 ENCOUNTER — Ambulatory Visit: Payer: BLUE CROSS/BLUE SHIELD | Admitting: Internal Medicine

## 2015-11-15 ENCOUNTER — Inpatient Hospital Stay: Payer: BLUE CROSS/BLUE SHIELD | Attending: Internal Medicine

## 2015-11-15 ENCOUNTER — Inpatient Hospital Stay (HOSPITAL_BASED_OUTPATIENT_CLINIC_OR_DEPARTMENT_OTHER): Payer: BLUE CROSS/BLUE SHIELD | Admitting: Internal Medicine

## 2015-11-15 VITALS — BP 117/81 | HR 72 | Temp 97.2°F | Resp 18 | Wt 123.2 lb

## 2015-11-15 DIAGNOSIS — C3411 Malignant neoplasm of upper lobe, right bronchus or lung: Secondary | ICD-10-CM | POA: Insufficient documentation

## 2015-11-15 DIAGNOSIS — Z7952 Long term (current) use of systemic steroids: Secondary | ICD-10-CM

## 2015-11-15 DIAGNOSIS — C7802 Secondary malignant neoplasm of left lung: Secondary | ICD-10-CM | POA: Insufficient documentation

## 2015-11-15 DIAGNOSIS — C3492 Malignant neoplasm of unspecified part of left bronchus or lung: Secondary | ICD-10-CM

## 2015-11-15 DIAGNOSIS — J700 Acute pulmonary manifestations due to radiation: Secondary | ICD-10-CM | POA: Insufficient documentation

## 2015-11-15 DIAGNOSIS — Z79899 Other long term (current) drug therapy: Secondary | ICD-10-CM

## 2015-11-15 DIAGNOSIS — I10 Essential (primary) hypertension: Secondary | ICD-10-CM | POA: Insufficient documentation

## 2015-11-15 DIAGNOSIS — C7931 Secondary malignant neoplasm of brain: Secondary | ICD-10-CM

## 2015-11-15 DIAGNOSIS — Z8719 Personal history of other diseases of the digestive system: Secondary | ICD-10-CM | POA: Diagnosis not present

## 2015-11-15 DIAGNOSIS — C7951 Secondary malignant neoplasm of bone: Secondary | ICD-10-CM | POA: Insufficient documentation

## 2015-11-15 DIAGNOSIS — M25552 Pain in left hip: Secondary | ICD-10-CM

## 2015-11-15 DIAGNOSIS — Z923 Personal history of irradiation: Secondary | ICD-10-CM | POA: Insufficient documentation

## 2015-11-15 DIAGNOSIS — M549 Dorsalgia, unspecified: Secondary | ICD-10-CM | POA: Insufficient documentation

## 2015-11-15 LAB — COMPREHENSIVE METABOLIC PANEL
ALK PHOS: 69 U/L (ref 38–126)
ALT: 16 U/L — ABNORMAL LOW (ref 17–63)
ANION GAP: 8 (ref 5–15)
AST: 18 U/L (ref 15–41)
Albumin: 3.4 g/dL — ABNORMAL LOW (ref 3.5–5.0)
BUN: 21 mg/dL — ABNORMAL HIGH (ref 6–20)
CALCIUM: 8.8 mg/dL — AB (ref 8.9–10.3)
CHLORIDE: 98 mmol/L — AB (ref 101–111)
CO2: 27 mmol/L (ref 22–32)
Creatinine, Ser: 0.93 mg/dL (ref 0.61–1.24)
GFR calc non Af Amer: >60 mL/min (ref >60)
Glucose, Bld: 125 mg/dL — ABNORMAL HIGH (ref 65–99)
Potassium: 4.2 mmol/L (ref 3.5–5.1)
SODIUM: 133 mmol/L — AB (ref 135–145)
Total Bilirubin: 0.5 mg/dL (ref 0.3–1.2)
Total Protein: 7.1 g/dL (ref 6.5–8.1)

## 2015-11-15 LAB — CBC WITH DIFFERENTIAL/PLATELET
Basophils Absolute: 0 10*3/uL (ref 0–0.1)
Basophils Relative: 0 %
EOS ABS: 0 10*3/uL (ref 0–0.7)
EOS PCT: 0 %
HCT: 31.6 % — ABNORMAL LOW (ref 40.0–52.0)
Hemoglobin: 10 g/dL — ABNORMAL LOW (ref 13.0–18.0)
LYMPHS ABS: 0.7 10*3/uL — AB (ref 1.0–3.6)
Lymphocytes Relative: 6 %
MCH: 22.5 pg — AB (ref 26.0–34.0)
MCHC: 31.7 g/dL — AB (ref 32.0–36.0)
MCV: 71.2 fL — ABNORMAL LOW (ref 80.0–100.0)
MONO ABS: 0.2 10*3/uL (ref 0.2–1.0)
MONOS PCT: 2 %
Neutro Abs: 10.7 10*3/uL — ABNORMAL HIGH (ref 1.4–6.5)
Neutrophils Relative %: 92 %
PLATELETS: 334 10*3/uL (ref 150–440)
RBC: 4.43 MIL/uL (ref 4.40–5.90)
RDW: 22 % — ABNORMAL HIGH (ref 11.5–14.5)
WBC: 11.7 10*3/uL — ABNORMAL HIGH (ref 3.8–10.6)

## 2015-11-15 MED ORDER — PREDNISONE 20 MG PO TABS: ORAL_TABLET | ORAL | Status: DC

## 2015-11-15 NOTE — Progress Notes (Signed)
.[place] OFFICE PROGRESS NOTE  Patient Care Team: Provider Not In System as PCP - General   SUMMARY OF ONCOLOGIC HISTORY:  # AUG 2015-  METASTATIC ADENO CA LUNG POSITIVE for EGFR Exon 21 L858R [neg- ALK;[name];[name],MET]; [name]; NOV [id number]- CT- Progression [RUL & Pelvic sclerotic lesions];  [name];[name]; [name]; [name]; stable lung nodule; increasing L1 [asymptomatic];   # Feb 9th 2017- START Arta Silence [place]  #  April 2017- No disease in chest; LUL- radiatio changes; START  prednsione  # AUG 2015- Thoracic spine mets s/p decompression & Cerebellar brain met s/p RT; NOV 2016- MRI BRAIN-PROGRESS-NEW multiple brain lesions [~46m]; Feb 9th MRI- subtle increase/number in size of brain lesions [~519m- asymptomatic. 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  Patient was started on prednisone approximately 2 weeks ago. He had been taking 60 mg once a day [for the last 2 weeks in spite of my recommendations to take smaller dose]; he cannot of the medication yesterday. He has noted significant improvement of the pain.   Denies any shortness of breath or chest pain. No nausea no vomiting. No headaches. No cough hemoptysis. No hematemesis. His not needing to take the oxycodone frequent.  Continues to be on Protonix. He continues to complain of fatigue.   REVIEW OF SYSTEMS:  A complete 10 point review of system is done which is negative except mentioned above/history of present illness.   PAST MEDICAL HISTORY  :  Past Medical History  Diagnosis Date  . HTN (hypertension)   . Brain lesion   . 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 History of radiation therapy   . [place]    PAST SURGICAL HISTORY :   Past Surgical History  Procedure Laterality Date  . Laminectomy N/A 03/07/2014    Procedure: T1-T2 Laminectomy with Decompression of Cord and Removal of Cancer  thoracic one/two;  Surgeon: HeKristeen MissMD;  [facility];  Service: Neurosurgery;  Laterality: N/A;  . Ct guided biopsy  ([facility] hx)  05/28/2014  . Esophagogastroduodenoscopy N/A 10/01/2015    Procedure: ESOPHAGOGASTRODUODENOSCOPY (EGD);  Surgeon: PaHulen LusterMD;  [facility];  Service: Endoscopy;  Laterality: N/A;    FAMILY HISTORY :   Family History  Problem Relation Age of Onset  . Colon cancer Mother   . Heart disease Father   . Hypertension Sister   . Hypertension Brother     SOCIAL HISTORY:   Social History  Substance Use Topics  . Smoking status: Never Smoker   . Smokeless tobacco: Never Used  . Alcohol Use: 0.5 oz/week    1 Standard drinks or equivalent per week     Comment: none for a year    ALLERGIES:  has No Known Allergies.  MEDICATIONS:  Current Outpatient Prescriptions  Medication Sig Dispense Refill  . cephALEXin (KEFLEX) 500 MG capsule 500 mg.  0  . levETIRAcetam (KEPPRA) 500 MG tablet Take 500 mg by mouth 2 (two) times daily.  0  . lisinopril (  PRINIVIL,ZESTRIL) 40 MG tablet Take 1 tablet by mouth daily. Reported on 10/31/2015  2  . metoprolol succinate (TOPROL-XL) 50 MG 24 hr tablet Take 1 tablet (50 mg total) by mouth daily. 30 tablet 5  . osimertinib mesylate (TAGRISSO) 80 MG tablet Take 1 tablet (80 mg total) by mouth daily. With or without food. 30 tablet 3  . oxyCODONE (OXY IR/ROXICODONE) 5 MG immediate release tablet 500 mg.  0  . pantoprazole (PROTONIX) 40 MG tablet 40 mg.  0  . potassium chloride (K-DUR,KLOR-CON) 10 MEQ tablet Take 1 tablet (10 mEq  total) by mouth 2 (two) times daily. 60 tablet 3  . guaiFENesin (MUCINEX) 600 MG 12 hr tablet Take 600 mg by mouth as needed for cough. Reported on 11/15/2015    . hydrochlorothiazide (HYDRODIURIL) 25 MG tablet Take 1 tablet (25 mg total) by mouth daily. (Patient not taking: Reported on 10/31/2015) 30 tablet 5   No current facility-administered medications for this visit.    PHYSICAL EXAMINATION: ECOG PERFORMANCE STATUS: 1 - Symptomatic but completely ambulatory  BP 117/81 mmHg  Pulse 72  Temp(Src) 97.2 F (36.2 C) (Tympanic)  Wt 123 lb 3.8 oz (55.9 kg)   GENERAL: Moderately built moderately nourished . Alert, no distress and comfortable.He is Alone. EYES: no pallor or icterus OROPHARYNX: no thrush or ulceration; good dentition  NECK: supple, no masses felt LYMPH: no palpable lymphadenopathy in the cervical, axillary or inguinal regions LUNGS: clear to auscultation and No wheeze or crackles HEART/CVS: regular rate & rhythm and no murmurs; No lower extremity edema ABDOMEN:abdomen soft, non-tender and normal bowel sounds Musculoskeletal:no cyanosis of digits and no clubbing; Mild swelling noted in the left scapular region. No exquisite tenderness. PSYCH: alert & oriented x 3; slow speech. NEURO: no focal motor-except for mild weakness in the left upper extremity-grasp/sensory deficits;  SKIN: no rashes or significant lesions  Filed Weights   11/15/15 1509  Weight: 123 lb 3.8 oz (55.9 kg)    LABORATORY DATA:  I have reviewed the data as listed    Component Value Date/Time   NA 133* 11/15/2015 1433   NA 141 11/08/2014 1505   K 4.2 11/15/2015 1433   K 3.7 11/08/2014 1505   CL 98* 11/15/2015 1433   CL 106 11/08/2014 1505   CO2 27 11/15/2015 1433   CO2 29 11/08/2014 1505   GLUCOSE 125* 11/15/2015 1433   GLUCOSE 75 11/08/2014 1505   BUN 21* 11/15/2015 1433   BUN 13 11/08/2014 1505   CREATININE 0.93 11/15/2015 1433   CREATININE 1.15 11/08/2014 1505   CALCIUM 8.8*  11/15/2015 1433   CALCIUM 9.1 11/08/2014 1505   PROT 7.1 11/15/2015 1433   PROT 7.7 11/08/2014 1505   ALBUMIN 3.4* 11/15/2015 1433   ALBUMIN 4.0 11/08/2014 1505   AST 18 11/15/2015 1433   AST 31 11/08/2014 1505   ALT 16* 11/15/2015 1433   ALT 53 11/08/2014 1505   ALKPHOS 69 11/15/2015 1433   ALKPHOS 62 11/08/2014 1505   BILITOT 0.5 11/15/2015 1433   BILITOT 0.6 11/08/2014 1505   GFRNONAA >60 11/15/2015 1433   GFRNONAA >60 11/08/2014 1505   GFRNONAA >60 08/01/2014 1023   GFRAA >60 11/15/2015 1433   GFRAA >60 11/08/2014 1505   GFRAA >60 08/01/2014 1023    No results found for: SPEP, UPEP  Lab Results  Component Value Date   WBC 11.7* 11/15/2015   NEUTROABS 10.7* 11/15/2015   HGB 10.0* 11/15/2015   HCT 31.6* 11/15/2015   MCV 71.2*  11/15/2015   PLT 334 11/15/2015      Chemistry      Component Value Date/Time   NA 133* 11/15/2015 1433   NA 141 11/08/2014 1505   K 4.2 11/15/2015 1433   K 3.7 11/08/2014 1505   CL 98* 11/15/2015 1433   CL 106 11/08/2014 1505   CO2 27 11/15/2015 1433   CO2 29 11/08/2014 1505   BUN 21* 11/15/2015 1433   BUN 13 11/08/2014 1505   CREATININE 0.93 11/15/2015 1433   CREATININE 1.15 11/08/2014 1505      Component Value Date/Time   CALCIUM 8.8* 11/15/2015 1433   CALCIUM 9.1 11/08/2014 1505   ALKPHOS 69 11/15/2015 1433   ALKPHOS 62 11/08/2014 1505   AST 18 11/15/2015 1433   AST 31 11/08/2014 1505   ALT 16* 11/15/2015 1433   ALT 53 11/08/2014 1505   BILITOT 0.5 11/15/2015 1433   BILITOT 0.6 11/08/2014 1505       ASSESSMENT & PLAN:   # Metastatic adenocarcinoma of the lung with EGFR mutation-positive for T790 M mutation/currently on THIRD LINE therapy with Tagrisso [Feb 9th 2017]. CT chest- left upper lobe inflammation noted [C discussion below]; the left upper lobe mass-  Resolved; bilateral approximately 1 cm supraclavicular adenopathy noted. CT cervical spine/thoracic spine shows stable disease. For now I recommend continued  Tagrisso.   # Left upper lobe infiltrate- clinically suggestive of radiation pneumonitis. Significant improvement noted. Continue prednisone 20 mg for one more month.  #  Thoracic/cervical spine metastases- Status post radiation- stable on recent MRI. Left shoulder pain secondary to radiation pneumonitis. Continue oxycodone for now.   # Bone mets- on X-geva; HOLD today calcium 8.8- recommend ca+vit D BID/ we'll plan Xgeva in 4 weeks.  # Brain metastases-  Status post brain radiation.  Clinically no evidence of progression.  #  Gastric ulcer/hematemesis-  Status post EGD.  Currently resolved. Recommended continued to be on Protonix twice a day; especially important as patient is on steroids.  # Patient will follow-up with me in 4 weeks with CBC BMP/ possible X-geva.       Cammie Sickle, MD 11/15/2015 3:30 PM

## 2015-11-15 NOTE — Progress Notes (Signed)
Patient ambulates without assistance, brought to exam room 5.  Patient c/o back and left hip pain, rates 2 oof 10.  Vitals documented, medication record updated information provided by patient.

## 2015-11-19 ENCOUNTER — Telehealth: Payer: Self-pay | Admitting: *Deleted

## 2015-11-19 DIAGNOSIS — M25512 Pain in left shoulder: Secondary | ICD-10-CM

## 2015-11-19 DIAGNOSIS — J700 Acute pulmonary manifestations due to radiation: Secondary | ICD-10-CM

## 2015-11-19 DIAGNOSIS — C3492 Malignant neoplasm of unspecified part of left bronchus or lung: Secondary | ICD-10-CM

## 2015-11-19 MED ORDER — CALCIUM CARBONATE-VITAMIN D 500-200 MG-UNIT PO TABS: 1.0000 | ORAL_TABLET | Freq: Two times a day (BID) | ORAL | Status: AC

## 2015-11-19 MED ORDER — OXYCODONE HCL 5 MG PO TABS: 5.0000 mg | ORAL_TABLET | Freq: Four times a day (QID) | ORAL | Status: DC | PRN

## 2015-11-19 MED ORDER — PREDNISONE 20 MG PO TABS: ORAL_TABLET | ORAL | Status: DC

## 2015-11-19 NOTE — Telephone Encounter (Signed)
Informed that prescription is ready to pick up  

## 2015-12-03 ENCOUNTER — Telehealth: Payer: Self-pay | Admitting: *Deleted

## 2015-12-03 DIAGNOSIS — C3492 Malignant neoplasm of unspecified part of left bronchus or lung: Secondary | ICD-10-CM

## 2015-12-03 DIAGNOSIS — M25512 Pain in left shoulder: Secondary | ICD-10-CM

## 2015-12-03 MED ORDER — OXYCODONE HCL 5 MG PO TABS: 10.0000 mg | ORAL_TABLET | Freq: Three times a day (TID) | ORAL | Status: DC | PRN

## 2015-12-03 NOTE — Telephone Encounter (Signed)
Per Dr Rogue Bussing, increase dose to 10 mg q 8 h prn. Patient informed of this and was instructed to use 2 tabs of his current Oxycodone 5 mg  Every 8 hours ands to call when he needs a refill. He repeated this back to me

## 2015-12-03 NOTE — Addendum Note (Signed)
Addended by: Betti Cruz on: 12/03/2015 04:15 PM   Modules accepted: Orders

## 2015-12-03 NOTE — Telephone Encounter (Signed)
Called to request a change in his pain med as it is not controlling his pain. He is currently on Oxycodone 5 mg q 6 h prn. Please advise

## 2015-12-13 ENCOUNTER — Inpatient Hospital Stay (HOSPITAL_BASED_OUTPATIENT_CLINIC_OR_DEPARTMENT_OTHER): Payer: BLUE CROSS/BLUE SHIELD | Admitting: Internal Medicine

## 2015-12-13 ENCOUNTER — Inpatient Hospital Stay: Payer: BLUE CROSS/BLUE SHIELD | Attending: Internal Medicine

## 2015-12-13 ENCOUNTER — Inpatient Hospital Stay: Payer: BLUE CROSS/BLUE SHIELD

## 2015-12-13 VITALS — BP 124/86 | HR 112 | Temp 98.1°F | Resp 22 | Wt 121.0 lb

## 2015-12-13 DIAGNOSIS — Z800 Family history of malignant neoplasm of digestive organs: Secondary | ICD-10-CM

## 2015-12-13 DIAGNOSIS — R109 Unspecified abdominal pain: Secondary | ICD-10-CM | POA: Diagnosis not present

## 2015-12-13 DIAGNOSIS — R531 Weakness: Secondary | ICD-10-CM | POA: Diagnosis not present

## 2015-12-13 DIAGNOSIS — Z7952 Long term (current) use of systemic steroids: Secondary | ICD-10-CM | POA: Insufficient documentation

## 2015-12-13 DIAGNOSIS — R634 Abnormal weight loss: Secondary | ICD-10-CM | POA: Insufficient documentation

## 2015-12-13 DIAGNOSIS — C3491 Malignant neoplasm of unspecified part of right bronchus or lung: Secondary | ICD-10-CM

## 2015-12-13 DIAGNOSIS — C7802 Secondary malignant neoplasm of left lung: Secondary | ICD-10-CM | POA: Diagnosis not present

## 2015-12-13 DIAGNOSIS — R05 Cough: Secondary | ICD-10-CM | POA: Insufficient documentation

## 2015-12-13 DIAGNOSIS — Z923 Personal history of irradiation: Secondary | ICD-10-CM

## 2015-12-13 DIAGNOSIS — Z79899 Other long term (current) drug therapy: Secondary | ICD-10-CM | POA: Diagnosis not present

## 2015-12-13 DIAGNOSIS — R64 Cachexia: Secondary | ICD-10-CM | POA: Insufficient documentation

## 2015-12-13 DIAGNOSIS — C3411 Malignant neoplasm of upper lobe, right bronchus or lung: Secondary | ICD-10-CM

## 2015-12-13 DIAGNOSIS — C7951 Secondary malignant neoplasm of bone: Secondary | ICD-10-CM | POA: Insufficient documentation

## 2015-12-13 DIAGNOSIS — R5383 Other fatigue: Secondary | ICD-10-CM | POA: Diagnosis not present

## 2015-12-13 DIAGNOSIS — R5381 Other malaise: Secondary | ICD-10-CM | POA: Diagnosis not present

## 2015-12-13 DIAGNOSIS — Z5111 Encounter for antineoplastic chemotherapy: Secondary | ICD-10-CM | POA: Insufficient documentation

## 2015-12-13 DIAGNOSIS — M25512 Pain in left shoulder: Secondary | ICD-10-CM | POA: Insufficient documentation

## 2015-12-13 DIAGNOSIS — I10 Essential (primary) hypertension: Secondary | ICD-10-CM

## 2015-12-13 DIAGNOSIS — T451X5A Adverse effect of antineoplastic and immunosuppressive drugs, initial encounter: Secondary | ICD-10-CM

## 2015-12-13 DIAGNOSIS — R110 Nausea: Secondary | ICD-10-CM | POA: Insufficient documentation

## 2015-12-13 DIAGNOSIS — R112 Nausea with vomiting, unspecified: Secondary | ICD-10-CM

## 2015-12-13 DIAGNOSIS — M549 Dorsalgia, unspecified: Secondary | ICD-10-CM | POA: Diagnosis not present

## 2015-12-13 DIAGNOSIS — R0602 Shortness of breath: Secondary | ICD-10-CM | POA: Insufficient documentation

## 2015-12-13 DIAGNOSIS — J700 Acute pulmonary manifestations due to radiation: Secondary | ICD-10-CM | POA: Insufficient documentation

## 2015-12-13 DIAGNOSIS — C7931 Secondary malignant neoplasm of brain: Secondary | ICD-10-CM | POA: Diagnosis not present

## 2015-12-13 DIAGNOSIS — R630 Anorexia: Secondary | ICD-10-CM | POA: Insufficient documentation

## 2015-12-13 DIAGNOSIS — M79622 Pain in left upper arm: Secondary | ICD-10-CM | POA: Diagnosis not present

## 2015-12-13 DIAGNOSIS — C3492 Malignant neoplasm of unspecified part of left bronchus or lung: Secondary | ICD-10-CM

## 2015-12-13 DIAGNOSIS — M545 Low back pain: Secondary | ICD-10-CM | POA: Diagnosis not present

## 2015-12-13 DIAGNOSIS — K0889 Other specified disorders of teeth and supporting structures: Secondary | ICD-10-CM | POA: Insufficient documentation

## 2015-12-13 LAB — CBC WITH DIFFERENTIAL/PLATELET
Basophils Absolute: 0 10*3/uL (ref 0–0.1)
Basophils Relative: 0 %
EOS ABS: 0.7 10*3/uL (ref 0–0.7)
Eosinophils Relative: 5 %
HCT: 31.7 % — ABNORMAL LOW (ref 40.0–52.0)
Hemoglobin: 10.1 g/dL — ABNORMAL LOW (ref 13.0–18.0)
Lymphocytes Relative: 10 %
Lymphs Abs: 1.5 10*3/uL (ref 1.0–3.6)
MCH: 21.8 pg — AB (ref 26.0–34.0)
MCHC: 32 g/dL (ref 32.0–36.0)
MCV: 68 fL — ABNORMAL LOW (ref 80.0–100.0)
MONO ABS: 1.1 10*3/uL — AB (ref 0.2–1.0)
MONOS PCT: 8 %
Neutro Abs: 10.8 10*3/uL — ABNORMAL HIGH (ref 1.4–6.5)
Neutrophils Relative %: 77 %
PLATELETS: 357 10*3/uL (ref 150–440)
RBC: 4.66 MIL/uL (ref 4.40–5.90)
RDW: 21.1 % — ABNORMAL HIGH (ref 11.5–14.5)
WBC: 14.1 10*3/uL — ABNORMAL HIGH (ref 3.8–10.6)

## 2015-12-13 LAB — COMPREHENSIVE METABOLIC PANEL
ALBUMIN: 3.5 g/dL (ref 3.5–5.0)
ALT: 10 U/L — AB (ref 17–63)
AST: 14 U/L — ABNORMAL LOW (ref 15–41)
Alkaline Phosphatase: 103 U/L (ref 38–126)
Anion gap: 7 (ref 5–15)
BUN: 14 mg/dL (ref 6–20)
CHLORIDE: 102 mmol/L (ref 101–111)
CO2: 24 mmol/L (ref 22–32)
CREATININE: 0.93 mg/dL (ref 0.61–1.24)
Calcium: 8.8 mg/dL — ABNORMAL LOW (ref 8.9–10.3)
GFR calc non Af Amer: >60 mL/min (ref >60)
GLUCOSE: 89 mg/dL (ref 65–99)
Potassium: 3.8 mmol/L (ref 3.5–5.1)
SODIUM: 133 mmol/L — AB (ref 135–145)
Total Bilirubin: 0.3 mg/dL (ref 0.3–1.2)
Total Protein: 7.5 g/dL (ref 6.5–8.1)

## 2015-12-13 MED ORDER — ONDANSETRON HCL 8 MG PO TABS: 8.0000 mg | ORAL_TABLET | Freq: Three times a day (TID) | ORAL | Status: AC | PRN

## 2015-12-13 MED ORDER — DENOSUMAB 120 MG/1.7ML ~~LOC~~ SOLN
120.0000 mg | Freq: Once | SUBCUTANEOUS | Status: AC
  Administered 2015-12-13: 120 mg via SUBCUTANEOUS
  Filled 2015-12-13: qty 1.7

## 2015-12-13 MED ORDER — OXYCODONE HCL 10 MG PO TABS: 10.0000 mg | ORAL_TABLET | Freq: Three times a day (TID) | ORAL | Status: DC | PRN

## 2015-12-13 NOTE — Progress Notes (Signed)
.[place] OFFICE PROGRESS NOTE  Patient Care Team: Provider Not In System as PCP - General   SUMMARY OF ONCOLOGIC HISTORY:  # AUG 2015-  METASTATIC ADENO CA LUNG POSITIVE for EGFR Exon 21 L858R [neg- ALK;[name];[name],MET]; [name]; NOV [id number]- CT- Progression [RUL & Pelvic sclerotic lesions];  [name];[name]; [name]; [name]; stable lung nodule; increasing L1 [asymptomatic];   # Feb 9th 2017- START Arta Silence [place] to [place]]  #  April 2017- No disease in chest; LUL- radiatio changes; START  prednsione  # AUG 2015- Thoracic spine mets s/p decompression & Cerebellar brain met s/p RT; NOV 2016- MRI BRAIN-PROGRESS-NEW multiple brain lesions [~12m]; Feb 9th MRI- subtle increase/number in size of brain lesions [~531m- asymptomatic. 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   patient stopped  Prednisone approximately 2 weeks ago  That was started for radiation pneumonitis.  Initially noted improvement of his left shoulder pain and left arm pain;  However in the last few weeks  Noted to have increasing pain.  His taking oxycodone 5 mg 1-2 pills every 6-8 hours.  He also notes to have worsening pain in his right hip.    He has mild shortness of breath;  No unusual cough. No headaches. No cough hemoptysis. No hematemesis.  He continues to complain of fatigue.   REVIEW OF SYSTEMS:  A complete 10 point review of system is done which is negative except mentioned above/history of present illness.   PAST MEDICAL HISTORY :  Past  Medical History  Diagnosis Date  . HTN (hypertension)   . Brain lesion   . 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 History of radiation therapy   . [place]    PAST SURGICAL HISTORY :   Past Surgical History  Procedure Laterality Date  . Laminectomy N/A 03/07/2014    Procedure: T1-T2 Laminectomy with Decompression of Cord and Removal of Cancer  thoracic one/two;  Surgeon: HeKristeen MissMD;  [facility];  Service: Neurosurgery;  Laterality: N/A;  . Ct guided biopsy  ([facility] hx)  05/28/2014  . Esophagogastroduodenoscopy N/A 10/01/2015    Procedure: ESOPHAGOGASTRODUODENOSCOPY (EGD);  Surgeon: PaHulen LusterMD;  [facility];  Service: Endoscopy;  Laterality: N/A;    FAMILY HISTORY :   Family History  Problem Relation Age of Onset  . Colon cancer Mother   . Heart disease Father   . Hypertension Sister   . Hypertension Brother     SOCIAL HISTORY:   Social History  Substance Use Topics  . Smoking status: Never Smoker   . Smokeless tobacco: Never Used  . Alcohol Use: 0.5 oz/week    1 Standard drinks or equivalent per week     Comment: none for a year    ALLERGIES:  has No Known Allergies.  MEDICATIONS:  Current Outpatient Prescriptions  Medication Sig Dispense Refill  . levETIRAcetam (KEPPRA) 500 MG tablet Take 500 mg by mouth 2 (two) times daily.  0  . osimertinib mesylate (TAGRISSO) 80 MG tablet Take  1 tablet (80 mg total) by mouth daily. With or without food. 30 tablet 3  . oxyCODONE 10 MG TABS Take 1 tablet (10 mg total) by mouth every 8 (eight) hours as needed for moderate pain or severe pain. 45 tablet 0  . predniSONE (DELTASONE) 20 MG tablet Take 20 mg by mouth daily. with food  0  . calcium-vitamin D (OSCAL WITH D) 500-200 MG-UNIT tablet Take 1 tablet by mouth 2 (two) times daily. 60 tablet 3  . ondansetron (ZOFRAN) 8 MG tablet Take 1 tablet (8 mg total) by mouth every 8 (eight) hours as needed for nausea or vomiting. 20 tablet 0    No current facility-administered medications for this visit.    PHYSICAL EXAMINATION: ECOG PERFORMANCE STATUS: 1 - Symptomatic but completely ambulatory  BP 124/86 mmHg  Pulse 112  Temp(Src) 98.1 F (36.7 C) (Oral)  Resp 22  Wt 121 lb (54.885 kg)   GENERAL: Moderately built moderately nourished . Alert, no distress and comfortable.He is Alone. EYES: no pallor or icterus OROPHARYNX: no thrush or ulceration; good dentition  NECK: supple, no masses felt LYMPH: no palpable lymphadenopathy in the cervical, axillary or inguinal regions LUNGS: clear to auscultation and No wheeze or crackles HEART/CVS: regular rate & rhythm and no murmurs; No lower extremity edema ABDOMEN:abdomen soft, non-tender and normal bowel sounds Musculoskeletal:no cyanosis of digits and no clubbing; Mild swelling noted in the left scapular region. No exquisite tenderness. PSYCH: alert & oriented x 3; slow speech. NEURO: no focal motor-except for mild weakness in the left upper extremity-grasp/sensory deficits;  SKIN: no rashes or significant lesions  Filed Weights   12/13/15 1450  Weight: 121 lb (54.885 kg)    LABORATORY DATA:  I have reviewed the data as listed    Component Value Date/Time   NA 133* 12/13/2015 1435   NA 141 11/08/2014 1505   K 3.8 12/13/2015 1435   K 3.7 11/08/2014 1505   CL 102 12/13/2015 1435   CL 106 11/08/2014 1505   CO2 24 12/13/2015 1435   CO2 29 11/08/2014 1505   GLUCOSE 89 12/13/2015 1435   GLUCOSE 75 11/08/2014 1505   BUN 14 12/13/2015 1435   BUN 13 11/08/2014 1505   CREATININE 0.93 12/13/2015 1435   CREATININE 1.15 11/08/2014 1505   CALCIUM 8.8* 12/13/2015 1435   CALCIUM 9.1 11/08/2014 1505   PROT 7.5 12/13/2015 1435   PROT 7.7 11/08/2014 1505   ALBUMIN 3.5 12/13/2015 1435   ALBUMIN 4.0 11/08/2014 1505   AST 14* 12/13/2015 1435   AST 31 11/08/2014 1505   ALT 10* 12/13/2015 1435   ALT 53 11/08/2014 1505   ALKPHOS 103 12/13/2015 1435   ALKPHOS 62  11/08/2014 1505   BILITOT 0.3 12/13/2015 1435   BILITOT 0.6 11/08/2014 1505   GFRNONAA >60 12/13/2015 1435   GFRNONAA >60 11/08/2014 1505   GFRNONAA >60 08/01/2014 1023   GFRAA >60 12/13/2015 1435   GFRAA >60 11/08/2014 1505   GFRAA >60 08/01/2014 1023    No results found for: SPEP, UPEP  Lab Results  Component Value Date   WBC 14.1* 12/13/2015   NEUTROABS 10.8* 12/13/2015   HGB 10.1* 12/13/2015   HCT 31.7* 12/13/2015   MCV 68.0* 12/13/2015   PLT 357 12/13/2015      Chemistry      Component Value Date/Time   NA 133* 12/13/2015 1435   NA 141 11/08/2014 1505   K 3.8 12/13/2015 1435   K 3.7 11/08/2014 1505   CL  102 12/13/2015 1435   CL 106 11/08/2014 1505   CO2 24 12/13/2015 1435   CO2 29 11/08/2014 1505   BUN 14 12/13/2015 1435   BUN 13 11/08/2014 1505   CREATININE 0.93 12/13/2015 1435   CREATININE 1.15 11/08/2014 1505      Component Value Date/Time   CALCIUM 8.8* 12/13/2015 1435   CALCIUM 9.1 11/08/2014 1505   ALKPHOS 103 12/13/2015 1435   ALKPHOS 62 11/08/2014 1505   AST 14* 12/13/2015 1435   AST 31 11/08/2014 1505   ALT 10* 12/13/2015 1435   ALT 53 11/08/2014 1505   BILITOT 0.3 12/13/2015 1435   BILITOT 0.6 11/08/2014 1505       ASSESSMENT & PLAN:   # Metastatic adenocarcinoma of the lung with EGFR mutation-positive for T790 M mutation/currently on THIRD LINE therapy with Tagrisso [Feb 9th 2017].  Given the worsening pain-  Left chest wall/ left upper extremity;  Also right hip-  Recommend restaging given the concerns of progression. PET scan ordered to be done in a week. If progression is noted recommend chemotherapy. For now continue Tagrisso.  # Left upper lobe infiltrate- clinically suggestive of radiation pneumonitis. Significant improvement noted.;  Patient stopped prednisone 2 weeks ago;  In spite of my recommendation to continue. Hold prednisone for now.  #   Left shoulder/ upper extremity pain- Thoracic/cervical spine metastases- Status post  radiation Vs.  secondary to radiation pneumonitis.  Await above PET scan.  # Bone mets- on X-geva; HOLD today calcium 8.8-  Continue Xgeva today.  # Brain metastases-  Status post brain radiation.  Clinically no evidence of progression.  #   Patient will follow-up with me in 2 weeks;  No labs/  Review the results of the PET scan.    Cammie Sickle, MD 12/13/2015 3:32 PM

## 2015-12-13 NOTE — Progress Notes (Signed)
Patient here for follow up. Complaints of generalized pain left arm and shoulder.

## 2015-12-13 NOTE — Progress Notes (Signed)
Pt requested RF on antiemetic. Previously used zofran 4 mg and compazine.  MD sent rx for Zofran 8 mg to [facility] in [place]

## 2015-12-20 ENCOUNTER — Encounter: Admission: RE | Admit: 2015-12-20 | Payer: BLUE CROSS/BLUE SHIELD | Source: Ambulatory Visit

## 2015-12-24 ENCOUNTER — Other Ambulatory Visit: Payer: Self-pay | Admitting: Internal Medicine

## 2015-12-24 DIAGNOSIS — C3492 Malignant neoplasm of unspecified part of left bronchus or lung: Secondary | ICD-10-CM

## 2015-12-25 ENCOUNTER — Other Ambulatory Visit: Payer: Self-pay | Admitting: Internal Medicine

## 2015-12-25 ENCOUNTER — Inpatient Hospital Stay (HOSPITAL_BASED_OUTPATIENT_CLINIC_OR_DEPARTMENT_OTHER): Payer: BLUE CROSS/BLUE SHIELD | Admitting: Oncology

## 2015-12-25 ENCOUNTER — Ambulatory Visit
Admission: RE | Admit: 2015-12-25 | Discharge: 2015-12-25 | Disposition: A | Discharge status: Home / Self Care | Payer: BLUE CROSS/BLUE SHIELD | Source: Ambulatory Visit | Attending: Internal Medicine | Admitting: Internal Medicine

## 2015-12-25 VITALS — BP 109/79 | HR 110 | Temp 98.3°F

## 2015-12-25 DIAGNOSIS — R5383 Other fatigue: Secondary | ICD-10-CM

## 2015-12-25 DIAGNOSIS — C7802 Secondary malignant neoplasm of left lung: Secondary | ICD-10-CM

## 2015-12-25 DIAGNOSIS — C7931 Secondary malignant neoplasm of brain: Secondary | ICD-10-CM

## 2015-12-25 DIAGNOSIS — R5381 Other malaise: Secondary | ICD-10-CM

## 2015-12-25 DIAGNOSIS — R52 Pain, unspecified: Secondary | ICD-10-CM

## 2015-12-25 DIAGNOSIS — M25512 Pain in left shoulder: Secondary | ICD-10-CM

## 2015-12-25 DIAGNOSIS — C3492 Malignant neoplasm of unspecified part of left bronchus or lung: Secondary | ICD-10-CM | POA: Insufficient documentation

## 2015-12-25 DIAGNOSIS — R531 Weakness: Secondary | ICD-10-CM

## 2015-12-25 DIAGNOSIS — J700 Acute pulmonary manifestations due to radiation: Secondary | ICD-10-CM

## 2015-12-25 DIAGNOSIS — C3411 Malignant neoplasm of upper lobe, right bronchus or lung: Secondary | ICD-10-CM | POA: Diagnosis not present

## 2015-12-25 DIAGNOSIS — R109 Unspecified abdominal pain: Secondary | ICD-10-CM

## 2015-12-25 DIAGNOSIS — M549 Dorsalgia, unspecified: Secondary | ICD-10-CM

## 2015-12-25 DIAGNOSIS — C7951 Secondary malignant neoplasm of bone: Secondary | ICD-10-CM | POA: Diagnosis not present

## 2015-12-25 DIAGNOSIS — M79622 Pain in left upper arm: Secondary | ICD-10-CM

## 2015-12-25 DIAGNOSIS — R0602 Shortness of breath: Secondary | ICD-10-CM

## 2015-12-25 DIAGNOSIS — Z923 Personal history of irradiation: Secondary | ICD-10-CM

## 2015-12-25 MED ORDER — IOPAMIDOL (ISOVUE-300) INJECTION 61%
85.0000 mL | Freq: Once | INTRAVENOUS | Status: AC | PRN
  Administered 2015-12-25: 85 mL via INTRAVENOUS

## 2015-12-25 NOTE — Progress Notes (Signed)
[place]  [phone]  [phone]     Luis [name] DOB: [birth year]  MR#: [id number]  WUX#:[id number]  Patient Care Team: Provider Not In System as PCP - General  CHIEF COMPLAINT:  Chief Complaint  Patient presents with  . Adenocarcinoma of lung, left ([place]  . 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 Feb 28th MRI- Brain mets; April 2017- STABLE Thoracic/cervical spine MRI  # Bony mets- on X-geva q 6 w   INTERVAL HISTORY: Patient is here today as an acute add on for pain. He was having a CT scan today and was sent over to the clinic by the tech because he was in pain. He is reporting increased pain in his left arm, shoulder and hip as well as in his bilateral legs and his back and abdomen. He reports that he is taking 10 mg oxycodone 2 tabs per day and it is not always helping.   He has mild shortness of breath; No unusual cough. No headaches. No cough hemoptysis. No hematemesis. He continues to complain of fatigue.  REVIEW OF SYSTEMS:   Review of Systems  Constitutional: Positive for malaise/fatigue.  HENT: Negative.   Eyes: Negative.   Respiratory: Positive for shortness of breath.   Cardiovascular: Negative.   Gastrointestinal:  Positive for abdominal pain.  Genitourinary: Negative.   Musculoskeletal: Positive for back pain and joint pain.  Skin: Negative.   Neurological: Positive for weakness.    As per HPI. Otherwise, a complete review of systems is negatve.  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 Getting palliative radiation.     [place] ([facility]  12/[place]/2014 Initial Diagnosis Adenocarcinoma of lung ([facility]   PAST MEDICAL HISTORY: Past Medical History  Diagnosis Date  . HTN (hypertension)   . Brain lesion   . 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 History of radiation therapy   . [place] of lung ([facility]    PAST SURGICAL HISTORY: Past Surgical History  Procedure Laterality Date  . Laminectomy N/A 03/07/2014    Procedure: T1-T2 Laminectomy with Decompression of Cord and Removal of Cancer  thoracic one/two;  Surgeon: HeKristeen MissMD;  [facility];  Service: Neurosurgery;  Laterality: N/A;  . Ct guided biopsy  ([facility] hx)  05/28/2014  . Esophagogastroduodenoscopy N/A 10/01/2015    Procedure: ESOPHAGOGASTRODUODENOSCOPY (EGD);  Surgeon: PaHulen LusterMD;  [facility];  Service: Endoscopy;  Laterality: N/A;    FAMILY HISTORY Family History  Problem Relation Age of Onset  . Colon cancer Mother   . Heart disease Father   . Hypertension Sister   . Hypertension Brother  ADVANCED DIRECTIVES:    HEALTH MAINTENANCE: Social History  Substance Use Topics  . Smoking status: Never Smoker   . Smokeless tobacco: Never Used  . Alcohol Use: 0.5 oz/week    1 Standard drinks or equivalent per week     Comment: none for a year    No Known Allergies  Current Outpatient Prescriptions  Medication Sig Dispense Refill  . calcium-vitamin D (OSCAL WITH D) 500-200 MG-UNIT tablet Take 1 tablet by  mouth 2 (two) times daily. 60 tablet 3  . levETIRAcetam (KEPPRA) 500 MG tablet Take 500 mg by mouth 2 (two) times daily.  0  . ondansetron (ZOFRAN) 8 MG tablet Take 1 tablet (8 mg total) by mouth every 8 (eight) hours as needed for nausea or vomiting. 20 tablet 0  . osimertinib mesylate (TAGRISSO) 80 MG tablet Take 1 tablet (80 mg total) by mouth daily. With or without food. 30 tablet 3  . oxyCODONE (OXY IR/ROXICODONE) 5 MG immediate release tablet   0  . oxyCODONE 10 MG TABS Take 1 tablet (10 mg total) by mouth every 8 (eight) hours as needed for moderate pain or severe pain. 45 tablet 0  . predniSONE (DELTASONE) 20 MG tablet Take 20 mg by mouth daily. with food  0   No current facility-administered medications for this visit.    OBJECTIVE: BP 109/79 mmHg  Pulse 110  Temp(Src) 98.3 F (36.8 C) (Oral)   There is no weight on file to calculate BMI.    ECOG FS:2 - Symptomatic, <50% confined to bed  General: Alert, no acute distress. Sitting in wheelchair Eyes: No pallor or icterus HEENT: No thrush or ulceration Lungs: Clear to auscultation bilaterally. Heart: Regular rate and rhythm. No murmur Abdomen: Soft, nontender, nondistended Musculoskeletal: No edema, cyanosis, or clubbing. No tenderness Skin: No rashes or lesions noted Psych: Normal affect.  LAB RESULTS:  No visits with results within 3 Day(s) from this visit. Latest known visit with results is:  Appointment on 12/13/2015  Component Date Value Ref Range Status  . WBC 12/13/2015 14.1* 3.8 - 10.6 K/uL Final  . RBC 12/13/2015 4.66  4.40 - 5.90 MIL/uL Final  . Hemoglobin 12/13/2015 10.1* 13.0 - 18.0 g/dL Final  . HCT 12/13/2015 31.7* 40.0 - 52.0 % Final  . MCV 12/13/2015 68.0* 80.0 - 100.0 fL Final  . MCH 12/13/2015 21.8* 26.0 - 34.0 pg Final  . MCHC 12/13/2015 32.0  32.0 - 36.0 g/dL Final  . RDW 12/13/2015 21.1* 11.5 - 14.5 % Final  . Platelets 12/13/2015 357  150 - 440 K/uL Final  . Neutrophils Relative % 12/13/2015 77    Final  . Neutro Abs 12/13/2015 10.8* 1.4 - 6.5 K/uL Final  . Lymphocytes Relative 12/13/2015 10   Final  . Lymphs Abs 12/13/2015 1.5  1.0 - 3.6 K/uL Final  . Monocytes Relative 12/13/2015 8   Final  . Monocytes Absolute 12/13/2015 1.1* 0.2 - 1.0 K/uL Final  . Eosinophils Relative 12/13/2015 5   Final  . Eosinophils Absolute 12/13/2015 0.7  0 - 0.7 K/uL Final  . Basophils Relative 12/13/2015 0   Final  . Basophils Absolute 12/13/2015 0.0  0 - 0.1 K/uL Final  . Sodium 12/13/2015 133* 135 - 145 mmol/L Final  . Potassium 12/13/2015 3.8  3.5 - 5.1 mmol/L Final  . Chloride 12/13/2015 102  101 - 111 mmol/L Final  . CO2 12/13/2015 24  22 - 32 mmol/L Final  . Glucose, Bld 12/13/2015 89  65 - 99  mg/dL Final  . BUN 12/13/2015 14  6 - 20 mg/dL Final  . Creatinine, Ser 12/13/2015 0.93  0.61 - 1.24 mg/dL Final  . Calcium 12/13/2015 8.8* 8.9 - 10.3 mg/dL Final  . Total Protein 12/13/2015 7.5  6.5 - 8.1 g/dL Final  . Albumin 12/13/2015 3.5  3.5 - 5.0 g/dL Final  . AST 12/13/2015 14* 15 - 41 U/L Final  . ALT 12/13/2015 10* 17 - 63 U/L Final  . Alkaline Phosphatase 12/13/2015 103  38 - 126 U/L Final  . Total Bilirubin 12/13/2015 0.3  0.3 - 1.2 mg/dL Final  . GFR calc non Af Amer 12/13/2015 >60  >60 mL/min Final  . GFR calc Af Amer 12/13/2015 >60  >60 mL/min Final   Comment: (NOTE) The eGFR has been calculated using the CKD EPI equation. This calculation has not been validated in all clinical situations. eGFR's persistently <60 mL/min signify possible Chronic Kidney Disease.   . Anion gap 12/13/2015 7  5 - 15 Final    STUDIES: No results found.  ASSESSMENT: Metastatic adenocarcinoma of lung, pain  1. 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 Patient had CT scan today - Return to clinic on Friday for results. Continue Tagrisso.  2. Left shoulder/ upper extremity pain/ generalized pain -  Recommended taking oxycodone three times daily - patient was only taking two times daily  3. Keep follow up on Friday with Dr B for scan results.   Patient expressed understanding and was in agreement with this plan. He also understands that He can call clinic at any time with any questions, concerns, or complaints.   Dr. Grayland Ormond was available for consultation and review of plan of care for this patient.   Mayra Reel, NP   12/25/2015 12:55 PM

## 2015-12-26 ENCOUNTER — Ambulatory Visit: Payer: BLUE CROSS/BLUE SHIELD

## 2015-12-27 ENCOUNTER — Ambulatory Visit: Payer: BLUE CROSS/BLUE SHIELD | Admitting: Internal Medicine

## 2015-12-28 ENCOUNTER — Ambulatory Visit: Payer: BLUE CROSS/BLUE SHIELD

## 2015-12-28 ENCOUNTER — Inpatient Hospital Stay (HOSPITAL_BASED_OUTPATIENT_CLINIC_OR_DEPARTMENT_OTHER): Payer: BLUE CROSS/BLUE SHIELD | Admitting: Internal Medicine

## 2015-12-28 ENCOUNTER — Inpatient Hospital Stay: Payer: BLUE CROSS/BLUE SHIELD

## 2015-12-28 VITALS — BP 113/79 | HR 128 | Temp 99.3°F | Resp 18 | Wt 122.4 lb

## 2015-12-28 DIAGNOSIS — C3491 Malignant neoplasm of unspecified part of right bronchus or lung: Secondary | ICD-10-CM

## 2015-12-28 DIAGNOSIS — G893 Neoplasm related pain (acute) (chronic): Secondary | ICD-10-CM | POA: Insufficient documentation

## 2015-12-28 DIAGNOSIS — R630 Anorexia: Secondary | ICD-10-CM

## 2015-12-28 DIAGNOSIS — R110 Nausea: Secondary | ICD-10-CM

## 2015-12-28 DIAGNOSIS — C3411 Malignant neoplasm of upper lobe, right bronchus or lung: Secondary | ICD-10-CM

## 2015-12-28 DIAGNOSIS — Z923 Personal history of irradiation: Secondary | ICD-10-CM

## 2015-12-28 DIAGNOSIS — R05 Cough: Secondary | ICD-10-CM

## 2015-12-28 DIAGNOSIS — R634 Abnormal weight loss: Secondary | ICD-10-CM

## 2015-12-28 DIAGNOSIS — C7802 Secondary malignant neoplasm of left lung: Secondary | ICD-10-CM

## 2015-12-28 DIAGNOSIS — M545 Low back pain: Secondary | ICD-10-CM

## 2015-12-28 DIAGNOSIS — C7931 Secondary malignant neoplasm of brain: Secondary | ICD-10-CM | POA: Insufficient documentation

## 2015-12-28 DIAGNOSIS — C419 Malignant neoplasm of bone and articular cartilage, unspecified: Secondary | ICD-10-CM

## 2015-12-28 DIAGNOSIS — R64 Cachexia: Secondary | ICD-10-CM

## 2015-12-28 DIAGNOSIS — C7951 Secondary malignant neoplasm of bone: Secondary | ICD-10-CM | POA: Diagnosis not present

## 2015-12-28 DIAGNOSIS — Z5111 Encounter for antineoplastic chemotherapy: Secondary | ICD-10-CM

## 2015-12-28 DIAGNOSIS — J700 Acute pulmonary manifestations due to radiation: Secondary | ICD-10-CM

## 2015-12-28 LAB — COMPREHENSIVE METABOLIC PANEL
ALK PHOS: 252 U/L — AB (ref 38–126)
ALT: 43 U/L (ref 17–63)
AST: 32 U/L (ref 15–41)
Albumin: 3 g/dL — ABNORMAL LOW (ref 3.5–5.0)
Anion gap: 10 (ref 5–15)
BUN: 14 mg/dL (ref 6–20)
CALCIUM: 8.1 mg/dL — AB (ref 8.9–10.3)
CO2: 21 mmol/L — AB (ref 22–32)
CREATININE: 0.9 mg/dL (ref 0.61–1.24)
Chloride: 98 mmol/L — ABNORMAL LOW (ref 101–111)
Glucose, Bld: 118 mg/dL — ABNORMAL HIGH (ref 65–99)
Potassium: 4.5 mmol/L (ref 3.5–5.1)
Sodium: 129 mmol/L — ABNORMAL LOW (ref 135–145)
Total Bilirubin: 0.4 mg/dL (ref 0.3–1.2)
Total Protein: 8 g/dL (ref 6.5–8.1)

## 2015-12-28 LAB — CBC WITH DIFFERENTIAL/PLATELET
BASOS ABS: 0 10*3/uL (ref 0–0.1)
BLASTS: 0 %
Band Neutrophils: 11 %
Basophils Relative: 0 %
EOS ABS: 3.5 10*3/uL — AB (ref 0–0.7)
Eosinophils Relative: 10 %
HCT: 31.8 % — ABNORMAL LOW (ref 40.0–52.0)
HEMOGLOBIN: 9.9 g/dL — AB (ref 13.0–18.0)
Lymphocytes Relative: 5 %
Lymphs Abs: 1.8 10*3/uL (ref 1.0–3.6)
MCH: 20.5 pg — AB (ref 26.0–34.0)
MCHC: 31.3 g/dL — ABNORMAL LOW (ref 32.0–36.0)
MCV: 65.4 fL — AB (ref 80.0–100.0)
METAMYELOCYTES PCT: 0 %
MYELOCYTES: 0 %
Monocytes Absolute: 2.5 10*3/uL — ABNORMAL HIGH (ref 0.2–1.0)
Monocytes Relative: 7 %
Neutro Abs: 27.5 10*3/uL — ABNORMAL HIGH (ref 1.4–6.5)
Neutrophils Relative %: 67 %
Other: 0 %
PROMYELOCYTES ABS: 0 %
Platelets: 354 10*3/uL (ref 150–440)
RBC: 4.86 MIL/uL (ref 4.40–5.90)
RDW: 21.9 % — ABNORMAL HIGH (ref 11.5–14.5)
WBC: 35.3 10*3/uL — AB (ref 3.8–10.6)
nRBC: 0 /100 WBC

## 2015-12-28 LAB — TSH: TSH: 1.848 u[IU]/mL (ref 0.350–4.500)

## 2015-12-28 MED ORDER — FOLIC ACID 1 MG PO TABS: 1.0000 mg | ORAL_TABLET | Freq: Every day | ORAL | Status: AC

## 2015-12-28 MED ORDER — CYANOCOBALAMIN 1000 MCG/ML IJ SOLN
1000.0000 ug | Freq: Once | INTRAMUSCULAR | Status: AC
  Administered 2015-12-28: 1000 ug via INTRAMUSCULAR
  Filled 2015-12-28: qty 1

## 2015-12-28 MED ORDER — DEXAMETHASONE 4 MG PO TABS: ORAL_TABLET | ORAL | Status: DC

## 2015-12-28 MED ORDER — PROCHLORPERAZINE MALEATE 10 MG PO TABS: 10.0000 mg | ORAL_TABLET | Freq: Four times a day (QID) | ORAL | Status: AC | PRN

## 2015-12-28 NOTE — Progress Notes (Signed)
Patient has generalized pain 6-7/10.  States he is SOB.  HR 128. Patient very weak.

## 2015-12-28 NOTE — Assessment & Plan Note (Addendum)
Patient has progressive lung cancer on current therapy with Tagrisso. Review the CT scan myself and with the patient and family.  I wound recommend subsequent line of chemotherapy- with carboplatin and Alimta; with Keytruda. Given the extensive disease I think patient would benefit from triple therapy. They understand all treatments are palliative/not curable.

## 2015-12-28 NOTE — Progress Notes (Signed)
[place] OFFICE PROGRESS NOTE  Patient Care Team: Provider Not In System as PCP - General  Metastatic primary lung cancer [facility])   Staging form: Lung, AJCC 7th Edition     Clinical: No stage assigned - Unsigned    Oncology History   # AUG 2015- METASTATIC ADENO CA LUNG POSITIVE for EGFR Exon 21 L858R [neg- ALK;[name];[name],MET]; [name]; NOV [id number]- CT- Progression [RUL & Pelvic sclerotic lesions]; [name];[name]; [name]; [name]; stable lung nodule; increasing L1 [asymptomatic];   # Feb 9th 2017- START Arta Silence [place] sec to [place]]  # April 2017- No disease in chest; LUL- radiatio changes; START prednsione  # AUG 2015- 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 Feb 28th MRI- Brain mets; April 2017- STABLE Thoracic/cervical spine MRI  # Bony mets- on X-geva q 6 w      Metastatic primary lung cancer ([facility]  09/30/2015 [facility]   INTERVAL HISTORY:  [age]ear old asian male patient with above history of metastatic adenocarcinoma of the lung with EGFR mutation currently on Third line therapy with Tagrisso and radiation pneumonitis of the left upper lobe is here for follow-up/To review the results of the restaging CAT scan.  Overall patient is feeling poorly. He has lost weight. Complains of pain in his lower back. Poor appetite. Nausea no vomiting.  No unusual shortness of breath or chest pain. Chronic cough. No hemoptysis. No headaches. He is currently wheelchair./Walks with a walker.   REVIEW OF SYSTEMS:  A complete 10 point review of system is done which is negative except mentioned above/history of  present illness.   PAST MEDICAL HISTORY :  Past Medical History  Diagnosis Date  . HTN (hypertension)   . Brain lesion   . 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 History of radiation therapy   . Adenocarcinoma of lung ([facility]    PAST SURGICAL HISTORY :   Past Surgical History  Procedure Laterality Date  . Laminectomy N/A 03/07/2014    Procedure: T1-T2 Laminectomy with Decompression of Cord and Removal of Cancer  thoracic one/two;  Surgeon: HeKristeen MissMD;  [facility];  Service: Neurosurgery;  Laterality: N/A;  . Ct guided biopsy  ([facility] hx)  05/28/2014  . Esophagogastroduodenoscopy N/A 10/01/2015    Procedure: ESOPHAGOGASTRODUODENOSCOPY (EGD);  Surgeon: PaHulen LusterMD;  [facility];  Service: Endoscopy;  Laterality: N/A;    FAMILY HISTORY :   Family History  Problem Relation Age of Onset  . Colon cancer Mother   . Heart disease Father   . Hypertension Sister   . Hypertension Brother     SOCIAL HISTORY:   Social History  Substance Use Topics  . Smoking status: Never Smoker   . Smokeless tobacco: Never Used  . Alcohol Use: 0.5 oz/week    1 Standard drinks or equivalent per week     Comment: none for a year    ALLERGIES:  has No Known Allergies.  MEDICATIONS:  Current Outpatient Prescriptions  Medication Sig Dispense Refill  . calcium-vitamin D (OSCAL WITH D) 500-200 MG-UNIT tablet Take 1 tablet by mouth 2 (two) times daily. 60 tablet 3  . levETIRAcetam (KEPPRA) 500 MG  tablet Take 500 mg by mouth 2 (two) times daily.  0  . ondansetron (ZOFRAN) 8 MG tablet Take 1 tablet (8 mg total) by mouth every 8 (eight) hours as needed for nausea or vomiting. 20 tablet 0  . osimertinib mesylate (TAGRISSO) 80 MG tablet Take 1 tablet (80 mg total) by mouth daily. With or without food. 30 tablet 3  . oxyCODONE (OXY IR/ROXICODONE) 5 MG immediate release tablet   0  . oxyCODONE 10 MG TABS Take 1 tablet (10 mg total) by mouth every 8 (eight) hours as needed for  moderate pain or severe pain. 45 tablet 0  . predniSONE (DELTASONE) 20 MG tablet Take 20 mg by mouth daily. with food  0  . dexamethasone (DECADRON) 4 MG tablet Take one pill AM & PM x 3 days; start the day prior to chemo. 60 tablet 0  . folic acid (FOLVITE) 1 MG tablet Take 1 tablet (1 mg total) by mouth daily. 90 tablet 1  . prochlorperazine (COMPAZINE) 10 MG tablet Take 1 tablet (10 mg total) by mouth every 6 (six) hours as needed for nausea or vomiting. 30 tablet 0   No current facility-administered medications for this visit.    PHYSICAL EXAMINATION: ECOG PERFORMANCE STATUS: 2 - Symptomatic, <50% confined to bed  BP 113/79 mmHg  Pulse 128  Temp(Src) 99.3 F (37.4 C) (Tympanic)  Resp 18  Wt 122 lb 5.7 oz (55.5 kg)  Filed Weights   12/28/15 1545  Weight: 122 lb 5.7 oz (55.5 kg)    GENERAL: Thin build cachectic; Alert, no distress and comfortable. Accompanied by his wife. EYES: no pallor or icterus OROPHARYNX: no thrush or ulceration; poor dentition. NECK: supple, no masses felt LYMPH: no palpable lymphadenopathy in the cervical, axillary or inguinal regions LUNGS: c decreased breath sounds nd No wheeze or crackles HEART/CVS: regular rate & rhythm and no murmurs; No lower extremity edema ABDOMEN: abdomen soft, non-tender and normal bowel sounds Musculoskeletal:no cyanosis of digits and no clubbing  PSYCH: alert & oriented x 3 with fluent speech NEURO: no focal motor/sensory deficits SKIN: no rashes or significant lesions  LABORATORY DATA:  I have reviewed the data as listed    Component Value Date/Time   NA 129* 12/28/2015 1616   NA 141 11/08/2014 1505   K 4.5 12/28/2015 1616   K 3.7 11/08/2014 1505   CL 98* 12/28/2015 1616   CL 106 11/08/2014 1505   CO2 21* 12/28/2015 1616   CO2 29 11/08/2014 1505   GLUCOSE 118* 12/28/2015 1616   GLUCOSE 75 11/08/2014 1505   BUN 14 12/28/2015 1616   BUN 13 11/08/2014 1505   CREATININE 0.90 12/28/2015 1616   CREATININE  1.15 11/08/2014 1505   CALCIUM 8.1* 12/28/2015 1616   CALCIUM 9.1 11/08/2014 1505   PROT 8.0 12/28/2015 1616   PROT 7.7 11/08/2014 1505   ALBUMIN 3.0* 12/28/2015 1616   ALBUMIN 4.0 11/08/2014 1505   AST 32 12/28/2015 1616   AST 31 11/08/2014 1505   ALT 43 12/28/2015 1616   ALT 53 11/08/2014 1505   ALKPHOS 252* 12/28/2015 1616   ALKPHOS 62 11/08/2014 1505   BILITOT 0.4 12/28/2015 1616   BILITOT 0.6 11/08/2014 1505   GFRNONAA >60 12/28/2015 1616   GFRNONAA >60 11/08/2014 1505   GFRNONAA >60 08/01/2014 1023   GFRAA >60 12/28/2015 1616   GFRAA >60 11/08/2014 1505   GFRAA >60 08/01/2014 1023    No results found for: SPEP, UPEP  Lab Results  Component Value  Date   WBC 35.3* 12/28/2015   NEUTROABS PENDING 12/28/2015   HGB 9.9* 12/28/2015   HCT 31.8* 12/28/2015   MCV 65.4* 12/28/2015   PLT 354 12/28/2015      Chemistry      Component Value Date/Time   NA 129* 12/28/2015 1616   NA 141 11/08/2014 1505   K 4.5 12/28/2015 1616   K 3.7 11/08/2014 1505   CL 98* 12/28/2015 1616   CL 106 11/08/2014 1505   CO2 21* 12/28/2015 1616   CO2 29 11/08/2014 1505   BUN 14 12/28/2015 1616   BUN 13 11/08/2014 1505   CREATININE 0.90 12/28/2015 1616   CREATININE 1.15 11/08/2014 1505      Component Value Date/Time   CALCIUM 8.1* 12/28/2015 1616   CALCIUM 9.1 11/08/2014 1505   ALKPHOS 252* 12/28/2015 1616   ALKPHOS 62 11/08/2014 1505   AST 32 12/28/2015 1616   AST 31 11/08/2014 1505   ALT 43 12/28/2015 1616   ALT 53 11/08/2014 1505   BILITOT 0.4 12/28/2015 1616   BILITOT 0.6 11/08/2014 1505       RADIOGRAPHIC STUDIES: I have personally reviewed the radiological images as listed and agreed with the findings in the report. No results found.   ASSESSMENT & PLAN:  Metastatic primary lung cancer [facility]) Patient has progressive lung cancer on current therapy with Tagrisso. Review the CT scan myself and with the patient and family.  I wound recommend subsequent line of chemotherapy-  with carboplatin and Alimta; with Keytruda. Given the extensive disease I think patient would benefit from triple therapy.   Encounter for antineoplastic chemotherapy Growth factor-Neulasta/On pro would be given as prophylaxis for chemotherapy-induced neutropenia to prevent febrile neutropenias. Discussed potential side effect- myalgias/arthralgias- recommend Claritin for 4 days.   Discussed the potential side effects including but not limited to-increasing fatigue, nausea vomiting, diarrhea, hair loss, sores in the mouth, increase risk of infection and also neuropathy.   # Chemotherapy education;Hopefully the planned start chemotherapy next week. Antiemetics-Zofran and Compazine. We will plan port placement near future.   # Recommend W38 injections; folic acid once a day; dexamethasone 4 mg twice a day starting day before chemotherapy for 3 days.  Metastatic bone cancer ([place]) Widespread metastatic disease to the bone; L4 symptomatic lesion . Recommend radiation. Refer to Dr. Donella Stade. Continue current pain medication. Continue Xgeva.   Brain metastases ([place]) Status post reirradiation; no clinical evidence of progression at this time in the brain.    Patient follow-up with me in approximately 10 days post chemotherapy/labs. We will check labs today.  Orders Placed This Encounter  Procedures  . CBC with Differential    Standing Status: Future     Number of Occurrences: 1     Standing Expiration Date: 12/27/2016  . Comprehensive metabolic panel    Standing Status: Future     Number of Occurrences: 1     Standing Expiration Date: 12/27/2016    Order Specific Question:  Has the patient fasted?    Answer:  No  . TSH    Standing Status: Future     Number of Occurrences: 1     Standing Expiration Date: 12/27/2016  . CBC with Differential    Standing Status: Future     Number of Occurrences:      Standing Expiration Date: 12/27/2016  . Comprehensive metabolic panel    Standing Status:  Future     Number of Occurrences:      Standing Expiration Date: 12/27/2016  Order Specific Question:  Has the patient fasted?    Answer:  No   All questions were answered. The patient knows to call the clinic with any problems, questions or concerns.      Cammie Sickle, MD 12/28/2015 5:43 PM

## 2015-12-28 NOTE — Patient Instructions (Signed)
Pembrolizumab injection What is this medicine? PEMBROLIZUMAB (pem broe liz ue mab) is a monoclonal antibody. It is used to treat melanoma and non-small cell lung cancer. This medicine may be used for other purposes; ask your health care provider or pharmacist if you have questions. What should I tell my health care provider before I take this medicine? They need to know if you have any of these conditions: -diabetes -immune system problems -inflammatory bowel disease -liver disease -lung or breathing disease -lupus -an unusual or allergic reaction to pembrolizumab, other medicines, foods, dyes, or preservatives -pregnant or trying to get pregnant -breast-feeding How should I use this medicine? This medicine is for infusion into a vein. It is given by a health care professional in a hospital or clinic setting. A special MedGuide will be given to you before each treatment. Be sure to read this information carefully each time. Talk to your pediatrician regarding the use of this medicine in children. Special care may be needed. Overdosage: If you think you have taken too much of this medicine contact a poison control center or emergency room at once. NOTE: This medicine is only for you. Do not share this medicine with others. What if I miss a dose? It is important not to miss your dose. Call your doctor or health care professional if you are unable to keep an appointment. What may interact with this medicine? Interactions have not been studied. Give your health care provider a list of all the medicines, herbs, non-prescription drugs, or dietary supplements you use. Also tell them if you smoke, drink alcohol, or use illegal drugs. Some items may interact with your medicine. This list may not describe all possible interactions. Give your health care provider a list of all the medicines, herbs, non-prescription drugs, or dietary supplements you use. Also tell them if you smoke, drink alcohol, or  use illegal drugs. Some items may interact with your medicine. What should I watch for while using this medicine? Your condition will be monitored carefully while you are receiving this medicine. You may need blood work done while you are taking this medicine. Do not become pregnant while taking this medicine or for 4 months after stopping it. Women should inform their doctor if they wish to become pregnant or think they might be pregnant. There is a potential for serious side effects to an unborn child. Talk to your health care professional or pharmacist for more information. Do not breast-feed an infant while taking this medicine or for 4 months after the last dose. What side effects may I notice from receiving this medicine? Side effects that you should report to your doctor or health care professional as soon as possible: -allergic reactions like skin rash, itching or hives, swelling of the face, lips, or tongue -bloody or black, tarry stools -breathing problems -change in the amount of urine -changes in vision -chest pain -chills -dark urine -dizziness or feeling faint or lightheaded -fast or irregular heartbeat -fever -flushing -hair loss -muscle pain -muscle weakness -persistent headache -signs and symptoms of high blood sugar such as dizziness; dry mouth; dry skin; fruity breath; nausea; stomach pain; increased hunger or thirst; increased urination -signs and symptoms of liver injury like dark urine, light-colored stools, loss of appetite, nausea, right upper belly pain, yellowing of the eyes or skin -stomach pain -weight loss Side effects that usually do not require medical attention (Report these to your doctor or health care professional if they continue or are bothersome.):constipation -cough -diarrhea -joint pain -  tiredness This list may not describe all possible side effects. Call your doctor for medical advice about side effects. You may report side effects to FDA at  1-800-FDA-1088. Where should I keep my medicine? This drug is given in a hospital or clinic and will not be stored at home. NOTE: This sheet is a summary. It may not cover all possible information. If you have questions about this medicine, talk to your doctor, pharmacist, or health care provider.    2016, Elsevier/Gold Standard. (2014-08-29 17:24:19) Pemetrexed injection What is this medicine? PEMETREXED (PEM e TREX ed) is a chemotherapy drug. This medicine affects cells that are rapidly growing, such as cancer cells and cells in your mouth and stomach. It is usually used to treat lung cancers like non-small cell lung cancer and mesothelioma. It may also be used to treat other cancers. This medicine may be used for other purposes; ask your health care provider or pharmacist if you have questions. What should I tell my health care provider before I take this medicine? They need to know if you have any of these conditions: -if you frequently drink alcohol containing beverages -infection (especially a virus infection such as chickenpox, cold sores, or herpes) -kidney disease -liver disease -low blood counts, like low platelets, red bloods, or white blood cells -an unusual or allergic reaction to pemetrexed, mannitol, other medicines, foods, dyes, or preservatives -pregnant or trying to get pregnant -breast-feeding How should I use this medicine? This drug is given as an infusion into a vein. It is administered in a hospital or clinic by a specially trained health care professional. Talk to your pediatrician regarding the use of this medicine in children. Special care may be needed. Overdosage: If you think you have taken too much of this medicine contact a poison control center or emergency room at once. NOTE: This medicine is only for you. Do not share this medicine with others. What if I miss a dose? It is important not to miss your dose. Call your doctor or health care professional if you  are unable to keep an appointment. What may interact with this medicine? -aspirin and aspirin-like medicines -medicines to increase blood counts like filgrastim, pegfilgrastim, sargramostim -methotrexate -NSAIDS, medicines for pain and inflammation, like ibuprofen or naproxen -probenecid -pyrimethamine -vaccines Talk to your doctor or health care professional before taking any of these medicines: -acetaminophen -aspirin -ibuprofen -ketoprofen -naproxen This list may not describe all possible interactions. Give your health care provider a list of all the medicines, herbs, non-prescription drugs, or dietary supplements you use. Also tell them if you smoke, drink alcohol, or use illegal drugs. Some items may interact with your medicine. What should I watch for while using this medicine? Visit your doctor for checks on your progress. This drug may make you feel generally unwell. This is not uncommon, as chemotherapy can affect healthy cells as well as cancer cells. Report any side effects. Continue your course of treatment even though you feel ill unless your doctor tells you to stop. In some cases, you may be given additional medicines to help with side effects. Follow all directions for their use. Call your doctor or health care professional for advice if you get a fever, chills or sore throat, or other symptoms of a cold or flu. Do not treat yourself. This drug decreases your body's ability to fight infections. Try to avoid being around people who are sick. This medicine may increase your risk to bruise or bleed. Call your doctor or  health care professional if you notice any unusual bleeding. Be careful brushing and flossing your teeth or using a toothpick because you may get an infection or bleed more easily. If you have any dental work done, tell your dentist you are receiving this medicine. Avoid taking products that contain aspirin, acetaminophen, ibuprofen, naproxen, or ketoprofen unless  instructed by your doctor. These medicines may hide a fever. Call your doctor or health care professional if you get diarrhea or mouth sores. Do not treat yourself. To protect your kidneys, drink water or other fluids as directed while you are taking this medicine. Men and women must use effective birth control while taking this medicine. You may also need to continue using effective birth control for a time after stopping this medicine. Do not become pregnant while taking this medicine. Tell your doctor right away if you think that you or your partner might be pregnant. There is a potential for serious side effects to an unborn child. Talk to your health care professional or pharmacist for more information. Do not breast-feed an infant while taking this medicine. This medicine may lower sperm counts. What side effects may I notice from receiving this medicine? Side effects that you should report to your doctor or health care professional as soon as possible: -allergic reactions like skin rash, itching or hives, swelling of the face, lips, or tongue -low blood counts - this medicine may decrease the number of white blood cells, red blood cells and platelets. You may be at increased risk for infections and bleeding. -signs of infection - fever or chills, cough, sore throat, pain or difficulty passing urine -signs of decreased platelets or bleeding - bruising, pinpoint red spots on the skin, black, tarry stools, blood in the urine -signs of decreased red blood cells - unusually weak or tired, fainting spells, lightheadedness -breathing problems, like a dry cough -changes in emotions or moods -chest pain -confusion -diarrhea -high blood pressure -mouth or throat sores or ulcers -pain, swelling, warmth in the leg -pain on swallowing -swelling of the ankles, feet, hands -trouble passing urine or change in the amount of urine -vomiting -yellowing of the eyes or skin Side effects that usually do not  require medical attention (report to your doctor or health care professional if they continue or are bothersome): -hair loss -loss of appetite -nausea -stomach upset This list may not describe all possible side effects. Call your doctor for medical advice about side effects. You may report side effects to FDA at 1-800-FDA-1088. Where should I keep my medicine? This drug is given in a hospital or clinic and will not be stored at home. NOTE: This sheet is a summary. It may not cover all possible information. If you have questions about this medicine, talk to your doctor, pharmacist, or health care provider.    2016, Elsevier/Gold Standard. (2008-02-01 13:24:03) Carboplatin injection What is this medicine? CARBOPLATIN (KAR boe pla tin) is a chemotherapy drug. It targets fast dividing cells, like cancer cells, and causes these cells to die. This medicine is used to treat ovarian cancer and many other cancers. This medicine may be used for other purposes; ask your health care provider or pharmacist if you have questions. What should I tell my health care provider before I take this medicine? They need to know if you have any of these conditions: -blood disorders -hearing problems -kidney disease -recent or ongoing radiation therapy -an unusual or allergic reaction to carboplatin, cisplatin, other chemotherapy, other medicines, foods, dyes, or preservatives -  pregnant or trying to get pregnant -breast-feeding How should I use this medicine? This drug is usually given as an infusion into a vein. It is administered in a hospital or clinic by a specially trained health care professional. Talk to your pediatrician regarding the use of this medicine in children. Special care may be needed. Overdosage: If you think you have taken too much of this medicine contact a poison control center or emergency room at once. NOTE: This medicine is only for you. Do not share this medicine with others. What if I  miss a dose? It is important not to miss a dose. Call your doctor or health care professional if you are unable to keep an appointment. What may interact with this medicine? -medicines for seizures -medicines to increase blood counts like filgrastim, pegfilgrastim, sargramostim -some antibiotics like amikacin, gentamicin, neomycin, streptomycin, tobramycin -vaccines Talk to your doctor or health care professional before taking any of these medicines: -acetaminophen -aspirin -ibuprofen -ketoprofen -naproxen This list may not describe all possible interactions. Give your health care provider a list of all the medicines, herbs, non-prescription drugs, or dietary supplements you use. Also tell them if you smoke, drink alcohol, or use illegal drugs. Some items may interact with your medicine. What should I watch for while using this medicine? Your condition will be monitored carefully while you are receiving this medicine. You will need important blood work done while you are taking this medicine. This drug may make you feel generally unwell. This is not uncommon, as chemotherapy can affect healthy cells as well as cancer cells. Report any side effects. Continue your course of treatment even though you feel ill unless your doctor tells you to stop. In some cases, you may be given additional medicines to help with side effects. Follow all directions for their use. Call your doctor or health care professional for advice if you get a fever, chills or sore throat, or other symptoms of a cold or flu. Do not treat yourself. This drug decreases your body's ability to fight infections. Try to avoid being around people who are sick. This medicine may increase your risk to bruise or bleed. Call your doctor or health care professional if you notice any unusual bleeding. Be careful brushing and flossing your teeth or using a toothpick because you may get an infection or bleed more easily. If you have any dental  work done, tell your dentist you are receiving this medicine. Avoid taking products that contain aspirin, acetaminophen, ibuprofen, naproxen, or ketoprofen unless instructed by your doctor. These medicines may hide a fever. Do not become pregnant while taking this medicine. Women should inform their doctor if they wish to become pregnant or think they might be pregnant. There is a potential for serious side effects to an unborn child. Talk to your health care professional or pharmacist for more information. Do not breast-feed an infant while taking this medicine. What side effects may I notice from receiving this medicine? Side effects that you should report to your doctor or health care professional as soon as possible: -allergic reactions like skin rash, itching or hives, swelling of the face, lips, or tongue -signs of infection - fever or chills, cough, sore throat, pain or difficulty passing urine -signs of decreased platelets or bleeding - bruising, pinpoint red spots on the skin, black, tarry stools, nosebleeds -signs of decreased red blood cells - unusually weak or tired, fainting spells, lightheadedness -breathing problems -changes in hearing -changes in vision -chest pain -high blood  pressure -low blood counts - This drug may decrease the number of white blood cells, red blood cells and platelets. You may be at increased risk for infections and bleeding. -nausea and vomiting -pain, swelling, redness or irritation at the injection site -pain, tingling, numbness in the hands or feet -problems with balance, talking, walking -trouble passing urine or change in the amount of urine Side effects that usually do not require medical attention (report to your doctor or health care professional if they continue or are bothersome): -hair loss -loss of appetite -metallic taste in the mouth or changes in taste This list may not describe all possible side effects. Call your doctor for medical  advice about side effects. You may report side effects to FDA at 1-800-FDA-1088. Where should I keep my medicine? This drug is given in a hospital or clinic and will not be stored at home. NOTE: This sheet is a summary. It may not cover all possible information. If you have questions about this medicine, talk to your doctor, pharmacist, or health care provider.    2016, Elsevier/Gold Standard. (2007-10-05 14:38:05)

## 2015-12-28 NOTE — Assessment & Plan Note (Signed)
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 Recommend radiation. Refer to Dr. Donella Stade. Continue current pain medication. Continue Xgeva.

## 2015-12-28 NOTE — Assessment & Plan Note (Signed)
Status post reirradiation; no clinical evidence of progression at this time in the brain.

## 2015-12-28 NOTE — Assessment & Plan Note (Addendum)
Growth factor-Neulasta/On pro would be given as prophylaxis for chemotherapy-induced neutropenia to prevent febrile neutropenias. Discussed potential side effect- myalgias/arthralgias- recommend Claritin for 4 days.   Discussed the potential side effects including but not limited to-increasing fatigue, nausea vomiting, diarrhea, hair loss, sores in the mouth, increase risk of infection and also neuropathy.   # Chemotherapy education;Hopefully the planned start chemotherapy next week. Antiemetics-Zofran and Compazine. We will plan port placement near future.   # Recommend C80 injections; folic acid once a day; dexamethasone 4 mg twice a day starting day before chemotherapy for 3 days.

## 2015-12-31 ENCOUNTER — Other Ambulatory Visit: Payer: Self-pay | Admitting: Internal Medicine

## 2015-12-31 ENCOUNTER — Ambulatory Visit: Payer: BLUE CROSS/BLUE SHIELD | Admitting: Radiation Oncology

## 2015-12-31 ENCOUNTER — Encounter: Payer: Self-pay | Admitting: *Deleted

## 2015-12-31 NOTE — Progress Notes (Signed)
Dr. Rogue Bussing called patient's insurance for peer to peer for chemotherapy at phone # [phone] option #3. Member id # [id number]. Prior auth for xgeva, neulasta, carbo/alimita/keytruda.  Prior auth pending. Pt's case will be reviewed. Cancer center will be notified no later than tomorrow morning on insurance decision.  LuAnn in prior auth cancer center made aware.

## 2016-01-01 ENCOUNTER — Other Ambulatory Visit: Payer: Self-pay | Admitting: *Deleted

## 2016-01-01 ENCOUNTER — Other Ambulatory Visit: Payer: Self-pay | Admitting: Internal Medicine

## 2016-01-01 ENCOUNTER — Inpatient Hospital Stay: Payer: BLUE CROSS/BLUE SHIELD

## 2016-01-01 DIAGNOSIS — C3492 Malignant neoplasm of unspecified part of left bronchus or lung: Secondary | ICD-10-CM

## 2016-01-01 DIAGNOSIS — C3411 Malignant neoplasm of upper lobe, right bronchus or lung: Secondary | ICD-10-CM | POA: Diagnosis not present

## 2016-01-01 MED ORDER — DEXAMETHASONE SODIUM PHOSPHATE 100 MG/10ML IJ SOLN
10.0000 mg | Freq: Once | INTRAMUSCULAR | Status: AC
  Administered 2016-01-01: 10 mg via INTRAVENOUS
  Filled 2016-01-01: qty 1

## 2016-01-01 MED ORDER — SODIUM CHLORIDE 0.9 % IV SOLN
Freq: Once | INTRAVENOUS | Status: AC
  Administered 2016-01-01: 12:00:00 via INTRAVENOUS
  Filled 2016-01-01: qty 1000

## 2016-01-01 MED ORDER — SODIUM CHLORIDE 0.9 % IV SOLN
500.0000 mg/m2 | Freq: Once | INTRAVENOUS | Status: AC
  Administered 2016-01-01: 800 mg via INTRAVENOUS
  Filled 2016-01-01: qty 28

## 2016-01-01 MED ORDER — PALONOSETRON HCL INJECTION 0.25 MG/5ML
0.2500 mg | Freq: Once | INTRAVENOUS | Status: AC
  Administered 2016-01-01: 0.25 mg via INTRAVENOUS
  Filled 2016-01-01: qty 5

## 2016-01-01 MED ORDER — CARBOPLATIN CHEMO INJECTION 600 MG/60ML
401.6000 mg | Freq: Once | INTRAVENOUS | Status: AC
  Administered 2016-01-01: 400 mg via INTRAVENOUS
  Filled 2016-01-01: qty 40

## 2016-01-01 NOTE — Progress Notes (Signed)
Patient at cancer center check out desk. After finishing his treatment, he asked receptionist if she could contact nurse via phone. Inquired whether he should still be taking tagrisso. I asked pt to make sure that he is no longer on this oral drug. I explained that he should not be taking this oral drug as he is now receiving IV chemotherapy. Teach back process performed with patient.  I asked pt to place a label on the Tagrisso- in black permanent marker "DO NOT TAKE" so that pt would not get confused and take this drug.

## 2016-01-01 NOTE — Progress Notes (Signed)
Patient did not show up for chemo class.

## 2016-01-02 ENCOUNTER — Inpatient Hospital Stay
Admission: RE | Admit: 2016-01-02 | Payer: BLUE CROSS/BLUE SHIELD | Source: Ambulatory Visit | Admitting: Radiation Oncology

## 2016-01-02 ENCOUNTER — Ambulatory Visit: Payer: BLUE CROSS/BLUE SHIELD | Admitting: Radiation Oncology

## 2016-01-03 ENCOUNTER — Encounter: Payer: Self-pay | Admitting: Radiation Oncology

## 2016-01-03 ENCOUNTER — Other Ambulatory Visit: Payer: Self-pay | Admitting: *Deleted

## 2016-01-03 ENCOUNTER — Ambulatory Visit
Admission: RE | Admit: 2016-01-03 | Discharge: 2016-01-03 | Disposition: A | Discharge status: Home / Self Care | Payer: BLUE CROSS/BLUE SHIELD | Source: Ambulatory Visit | Attending: Radiation Oncology | Admitting: Radiation Oncology

## 2016-01-03 VITALS — BP 129/91 | HR 122 | Temp 98.0°F | Wt 125.6 lb

## 2016-01-03 DIAGNOSIS — M25512 Pain in left shoulder: Secondary | ICD-10-CM

## 2016-01-03 DIAGNOSIS — C3491 Malignant neoplasm of unspecified part of right bronchus or lung: Secondary | ICD-10-CM

## 2016-01-03 DIAGNOSIS — C3492 Malignant neoplasm of unspecified part of left bronchus or lung: Secondary | ICD-10-CM

## 2016-01-03 MED ORDER — OXYCODONE HCL 10 MG PO TABS: 10.0000 mg | ORAL_TABLET | Freq: Three times a day (TID) | ORAL | Status: DC | PRN

## 2016-01-03 NOTE — Progress Notes (Signed)
Radiation Oncology Follow up Note  Name: Luis [name]   Date:   01/03/2016 MRN:  [id number] DOB: 10/25/1965                                                           Old patient new area ([facility])   This 50 y.o. male presents to the clinic today for evaluation of progressive metastatic disease and lower lumbar spine and SI joint region.  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 He is currently on triple therapy with carboplatinum and Alimta and Keytruda.. He's having increasing lower back pain and recent CT scan shows multiple level involvement of metastatic disease including lumbar spine and sacrum. I been asked to reevaluate him for possible palliative radiation therapy to this area. He is currently narcotic dependent and I am requesting refill of his prescription medication by medical oncology at this time. He is wheelchair-bound not ambulating well.  COMPLICATIONS OF TREATMENT: none  FOLLOW UP COMPLIANCE: keeps appointments   PHYSICAL EXAM:  BP 129/91 mmHg  Pulse 122  Temp(Src) 98 F (36.7 C)  Wt 125 lb 8.8 oz (56.95 kg) Frail ill-appearing male wheelchair-bound in NAD. He does have some increased decreased strength in his left upper extremity. No motor or sensory levels of lower extremities proprioception is intact. Range of motion of his lower extremities does not elicit pain. Well-developed well-nourished patient in NAD. HEENT reveals PERLA, EOMI, discs not visualized.  Oral cavity is clear. No oral mucosal lesions are identified. Neck is clear without evidence of cervical or supraclavicular adenopathy. Lungs are clear to A&P. Cardiac examination is essentially unremarkable with regular rate and rhythm without murmur rub or thrill. Abdomen is benign with no organomegaly or masses noted. Motor sensory and  DTR levels are equal and symmetric in the upper and lower extremities. Cranial nerves II through XII are grossly intact. Proprioception is intact. No peripheral adenopathy or edema is identified. No motor or sensory levels are noted. Crude visual fields are within normal range.  RADIOLOGY RESULTS: CT scans of chest abdomen and pelvis reviewed and compatible with the above-stated findings  PLAN: At this time patient is becoming close to hospice care. Based on his increasing pain and CT findings would recommend a palliative course of radiation to his L-spine and SI joints 3000 cGy in 10 fractions. Risks and benefits of treatment including possible diarrhea fatigue alteration of blood counts skin reaction all were discussed in detail with the patient and his wife. I have set up CT simulation for early next week. We'll try to get them under treatment as soon as possible.  I would like to take this opportunity to thank you for allowing me to participate in the care of your patient.Armstead Peaks., MD

## 2016-01-03 NOTE — Progress Notes (Signed)
Pt in radiation therapy dept. Requesting RF on oxycodone.  RX refilled and signed by Dr. Rogue Bussing. Pt given rx.

## 2016-01-04 LAB — SURGICAL PATHOLOGY

## 2016-01-11 ENCOUNTER — Emergency Department: Payer: BLUE CROSS/BLUE SHIELD

## 2016-01-11 ENCOUNTER — Inpatient Hospital Stay
Admission: EM | Admit: 2016-01-11 | Discharge: 2016-01-16 | DRG: 812 | Disposition: A | Discharge status: Home Health | Payer: BLUE CROSS/BLUE SHIELD | Attending: Internal Medicine | Admitting: Internal Medicine

## 2016-01-11 ENCOUNTER — Encounter: Payer: Self-pay | Admitting: Urgent Care

## 2016-01-11 DIAGNOSIS — D72829 Elevated white blood cell count, unspecified: Secondary | ICD-10-CM | POA: Diagnosis present

## 2016-01-11 DIAGNOSIS — D62 Acute posthemorrhagic anemia: Secondary | ICD-10-CM | POA: Diagnosis present

## 2016-01-11 DIAGNOSIS — A419 Sepsis, unspecified organism: Secondary | ICD-10-CM

## 2016-01-11 DIAGNOSIS — I10 Essential (primary) hypertension: Secondary | ICD-10-CM | POA: Diagnosis present

## 2016-01-11 DIAGNOSIS — M79602 Pain in left arm: Secondary | ICD-10-CM | POA: Diagnosis present

## 2016-01-11 DIAGNOSIS — J189 Pneumonia, unspecified organism: Secondary | ICD-10-CM

## 2016-01-11 DIAGNOSIS — D630 Anemia in neoplastic disease: Secondary | ICD-10-CM | POA: Diagnosis present

## 2016-01-11 DIAGNOSIS — C7951 Secondary malignant neoplasm of bone: Secondary | ICD-10-CM | POA: Diagnosis present

## 2016-01-11 DIAGNOSIS — Y92531 Health care provider office as the place of occurrence of the external cause: Secondary | ICD-10-CM

## 2016-01-11 DIAGNOSIS — Z8249 Family history of ischemic heart disease and other diseases of the circulatory system: Secondary | ICD-10-CM

## 2016-01-11 DIAGNOSIS — T451X5A Adverse effect of antineoplastic and immunosuppressive drugs, initial encounter: Secondary | ICD-10-CM | POA: Diagnosis present

## 2016-01-11 DIAGNOSIS — E440 Moderate protein-calorie malnutrition: Secondary | ICD-10-CM | POA: Diagnosis present

## 2016-01-11 DIAGNOSIS — E872 Acidosis: Secondary | ICD-10-CM | POA: Diagnosis present

## 2016-01-11 DIAGNOSIS — Z923 Personal history of irradiation: Secondary | ICD-10-CM | POA: Diagnosis not present

## 2016-01-11 DIAGNOSIS — Z800 Family history of malignant neoplasm of digestive organs: Secondary | ICD-10-CM | POA: Diagnosis not present

## 2016-01-11 DIAGNOSIS — R197 Diarrhea, unspecified: Secondary | ICD-10-CM | POA: Diagnosis present

## 2016-01-11 DIAGNOSIS — C3491 Malignant neoplasm of unspecified part of right bronchus or lung: Secondary | ICD-10-CM | POA: Diagnosis present

## 2016-01-11 DIAGNOSIS — D6481 Anemia due to antineoplastic chemotherapy: Secondary | ICD-10-CM | POA: Diagnosis present

## 2016-01-11 DIAGNOSIS — Z9221 Personal history of antineoplastic chemotherapy: Secondary | ICD-10-CM | POA: Diagnosis not present

## 2016-01-11 DIAGNOSIS — C7802 Secondary malignant neoplasm of left lung: Secondary | ICD-10-CM | POA: Diagnosis not present

## 2016-01-11 DIAGNOSIS — C7931 Secondary malignant neoplasm of brain: Secondary | ICD-10-CM | POA: Diagnosis not present

## 2016-01-11 DIAGNOSIS — C3411 Malignant neoplasm of upper lobe, right bronchus or lung: Secondary | ICD-10-CM | POA: Diagnosis not present

## 2016-01-11 LAB — CBC WITH DIFFERENTIAL/PLATELET
BAND NEUTROPHILS: 16 %
BASOS PCT: 0 %
Basophils Absolute: 0 10*3/uL (ref 0–0.1)
Blasts: 0 %
EOS ABS: 3.2 10*3/uL — AB (ref 0–0.7)
Eosinophils Relative: 11 %
HCT: 29.4 % — ABNORMAL LOW (ref 40.0–52.0)
HEMOGLOBIN: 9 g/dL — AB (ref 13.0–18.0)
Lymphocytes Relative: 3 %
Lymphs Abs: 0.9 10*3/uL — ABNORMAL LOW (ref 1.0–3.6)
MCH: 19.9 pg — ABNORMAL LOW (ref 26.0–34.0)
MCHC: 30.6 g/dL — AB (ref 32.0–36.0)
MCV: 64.9 fL — ABNORMAL LOW (ref 80.0–100.0)
METAMYELOCYTES PCT: 0 %
MONO ABS: 0.6 10*3/uL (ref 0.2–1.0)
MYELOCYTES: 0 %
Monocytes Relative: 2 %
Neutro Abs: 24.2 10*3/uL — ABNORMAL HIGH (ref 1.4–6.5)
Neutrophils Relative %: 68 %
OTHER: 0 %
PROMYELOCYTES ABS: 0 %
Platelets: 134 10*3/uL — ABNORMAL LOW (ref 150–440)
RBC: 4.53 MIL/uL (ref 4.40–5.90)
RDW: 22.7 % — ABNORMAL HIGH (ref 11.5–14.5)
WBC: 28.9 10*3/uL — ABNORMAL HIGH (ref 3.8–10.6)
nRBC: 0 /100 WBC

## 2016-01-11 LAB — COMPREHENSIVE METABOLIC PANEL
ALK PHOS: 235 U/L — AB (ref 38–126)
ALT: 28 U/L (ref 17–63)
AST: 19 U/L (ref 15–41)
Albumin: 2.7 g/dL — ABNORMAL LOW (ref 3.5–5.0)
Anion gap: 10 (ref 5–15)
BUN: 14 mg/dL (ref 6–20)
CALCIUM: 8 mg/dL — AB (ref 8.9–10.3)
CO2: 24 mmol/L (ref 22–32)
CREATININE: 0.8 mg/dL (ref 0.61–1.24)
Chloride: 99 mmol/L — ABNORMAL LOW (ref 101–111)
Glucose, Bld: 112 mg/dL — ABNORMAL HIGH (ref 65–99)
Potassium: 3.6 mmol/L (ref 3.5–5.1)
Sodium: 133 mmol/L — ABNORMAL LOW (ref 135–145)
Total Bilirubin: 0.7 mg/dL (ref 0.3–1.2)
Total Protein: 7 g/dL (ref 6.5–8.1)

## 2016-01-11 LAB — LACTIC ACID, PLASMA
LACTIC ACID, VENOUS: 2.1 mmol/L — AB (ref 0.5–1.9)
Lactic Acid, Venous: 1.3 mmol/L (ref 0.5–1.9)

## 2016-01-11 LAB — HCG, QUANTITATIVE, PREGNANCY: HCG, BETA CHAIN, QUANT, S: 5 m[IU]/mL — AB (ref <5)

## 2016-01-11 LAB — TROPONIN I

## 2016-01-11 MED ORDER — SODIUM CHLORIDE 0.9 % IV BOLUS (SEPSIS)
500.0000 mL | Freq: Once | INTRAVENOUS | Status: AC
  Administered 2016-01-11: 500 mL via INTRAVENOUS

## 2016-01-11 MED ORDER — PIPERACILLIN-TAZOBACTAM 3.375 G IVPB 30 MIN
3.3750 g | Freq: Once | INTRAVENOUS | Status: AC
  Administered 2016-01-11: 3.375 g via INTRAVENOUS
  Filled 2016-01-11: qty 50

## 2016-01-11 MED ORDER — SODIUM CHLORIDE 0.9 % IV BOLUS (SEPSIS)
1000.0000 mL | Freq: Once | INTRAVENOUS | Status: AC
Start: 2016-01-11 — End: 2016-01-11
  Administered 2016-01-11: 1000 mL via INTRAVENOUS

## 2016-01-11 MED ORDER — SODIUM CHLORIDE 0.9 % IV BOLUS (SEPSIS)
250.0000 mL | Freq: Once | INTRAVENOUS | Status: AC
  Administered 2016-01-11: 250 mL via INTRAVENOUS

## 2016-01-11 MED ORDER — VANCOMYCIN HCL IN DEXTROSE 1-5 GM/200ML-% IV SOLN
1000.0000 mg | Freq: Once | INTRAVENOUS | Status: AC
Start: 2016-01-11 — End: 2016-01-11
  Administered 2016-01-11: 1000 mg via INTRAVENOUS
  Filled 2016-01-11: qty 200

## 2016-01-11 NOTE — ED Provider Notes (Signed)
[facility] Emergency Department Provider Note    ____________________________________________  Time seen: ~1955  I have reviewed the triage vital signs and the nursing notes.   HISTORY  Chief Complaint Weakness   History limited by: Not Limited   HPI Luis [name] is a 50 y.o. male with history of stage IV lung cancer who presents to the emergency department today with worsening weakness. The patient states the weakness is been getting bad for the past 2-3 weeks. Today however he was unable to walk. The patient has recently started chemotherapy for his cancer. He denies any chest pain or shortness of breath. Denies any abdominal pain nausea vomiting diarrhea. He does have some left hand and arm pain which is thought secondary to his cancer and for which he takes pain medication. He was febrile here however states he did not appreciate that he had any fevers at home.   Past Medical History  Diagnosis Date  . HTN (hypertension)   . Brain lesion   . 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 History of radiation therapy   . Adenocarcinoma of lung ([place]) 12/05/2014    Patient Active Problem List   Diagnosis Date Noted  . Encounter for antineoplastic chemotherapy 12/28/2015  . Metastatic bone cancer ([place]) 12/28/2015  . Brain metastases ([place]) 12/28/2015  . Neoplasm related pain 12/28/2015  . Upper GI bleed 09/30/2015  . Acute blood loss anemia 09/30/2015  . Epigastric pain 09/30/2015  . Metastatic primary lung cancer ([place]) 09/30/2015  . GI bleed 09/30/2015  . Left hand weakness 09/10/2015  . Spinal cord tumor ([place]) 03/08/2014  . Essential hypertension, benign 03/08/2014  . Weakness 03/06/2014    Past Surgical History  Procedure Laterality Date  . Laminectomy N/A 03/07/2014    Procedure: T1-T2 Laminectomy with Decompression of Cord and Removal of Cancer  thoracic one/two;  Surgeon: Kristeen Miss, MD;  Location: [place] NEURO ORS;  Service: Neurosurgery;  Laterality: N/A;   . Ct guided biopsy  ([facility] hx)  05/28/2014  . Esophagogastroduodenoscopy N/A 10/01/2015    Procedure: ESOPHAGOGASTRODUODENOSCOPY (EGD);  Surgeon: Hulen Luster, MD;  Location: [facility] ENDOSCOPY;  Service: Endoscopy;  Laterality: N/A;    Current Outpatient Rx  Name  Route  Sig  Dispense  Refill  . calcium-vitamin D (OSCAL WITH D) 500-200 MG-UNIT tablet   Oral   Take 1 tablet by mouth 2 (two) times daily.   60 tablet   3   . dexamethasone (DECADRON) 4 MG tablet      Take one pill AM & PM x 3 days; start the day prior to chemo.   60 tablet   0   . folic acid (FOLVITE) 1 MG tablet   Oral   Take 1 tablet (1 mg total) by mouth daily.   90 tablet   1   . levETIRAcetam (KEPPRA) 500 MG tablet   Oral   Take 500 mg by mouth 2 (two) times daily.      0   . ondansetron (ZOFRAN) 8 MG tablet   Oral   Take 1 tablet (8 mg total) by mouth every 8 (eight) hours as needed for nausea or vomiting.   20 tablet   0   . oxyCODONE (OXY IR/ROXICODONE) 5 MG immediate release tablet            0   . Oxycodone HCl 10 MG TABS   Oral   Take 1 tablet (10 mg total) by mouth every 8 (eight) hours as needed.  45 tablet   0   . predniSONE (DELTASONE) 20 MG tablet   Oral   Take 20 mg by mouth daily. with food      0   . prochlorperazine (COMPAZINE) 10 MG tablet   Oral   Take 1 tablet (10 mg total) by mouth every 6 (six) hours as needed for nausea or vomiting.   30 tablet   0     Allergies Review of patient's allergies indicates no known allergies.  Family History  Problem Relation Age of Onset  . Colon cancer Mother   . Heart disease Father   . Hypertension Sister   . Hypertension Brother     Social History Social History  Substance Use Topics  . Smoking status: Never Smoker   . Smokeless tobacco: Never Used  . Alcohol Use: 0.5 oz/week    1 Standard drinks or equivalent per week     Comment: none for a year    Review of Systems  Constitutional: Negative for  fever. Cardiovascular: Negative for chest pain. Respiratory: Negative for shortness of breath. Gastrointestinal: Negative for abdominal pain, vomiting and diarrhea. Genitourinary: Negative for dysuria. Musculoskeletal: Negative for back pain. Positive for left arm pain. Skin: Negative for rash. Neurological: Negative for headaches, focal weakness or numbness.  10-point ROS otherwise negative.  ____________________________________________   PHYSICAL EXAM:  VITAL SIGNS: ED Triage Vitals  Enc Vitals Group     BP 01/11/16 1919 107/63 mmHg     Pulse Rate 01/11/16 1919 132     Resp 01/11/16 1919 16     Temp 01/11/16 1919 100.4 F (38 C)     Temp Source 01/11/16 1919 Oral     SpO2 01/11/16 1919 99 %     Weight 01/11/16 1919 123 lb (55.792 kg)     Height 01/11/16 1919 '5\' 7"'$  (1.702 m)     Head Cir --      Peak Flow --      Pain Score 01/11/16 1921 0   Constitutional: Alert and oriented. Chronically ill appearing. Cachectic.  Eyes: Conjunctivae are normal. PERRL. Normal extraocular movements. ENT   Head: Normocephalic and atraumatic.   Nose: No congestion/rhinnorhea.   Mouth/Throat: Mucous membranes are moist.   Neck: No stridor. Hematological/Lymphatic/Immunilogical: No cervical lymphadenopathy. Cardiovascular: Normal rate, regular rhythm.  No murmurs, rubs, or gallops. Respiratory: Normal respiratory effort without tachypnea nor retractions. Breath sounds are clear and equal bilaterally. No wheezes/rales/rhonchi. Gastrointestinal: Soft and nontender. No distention.  Genitourinary: Deferred Musculoskeletal: Normal range of motion in all extremities. No joint effusions.  No lower extremity tenderness nor edema. Neurologic:  Normal speech and language. No gross focal neurologic deficits are appreciated.  Skin:  Skin is warm, dry and intact. No rash noted. Psychiatric: Mood and affect are normal. Speech and behavior are normal. Patient exhibits appropriate insight and  judgment.  ____________________________________________    LABS (pertinent positives/negatives)  Labs Reviewed  COMPREHENSIVE METABOLIC PANEL - Abnormal; Notable for the following:    Sodium 133 (*)    Chloride 99 (*)    Glucose, Bld 112 (*)    Calcium 8.0 (*)    Albumin 2.7 (*)    Alkaline Phosphatase 235 (*)    All other components within normal limits  HCG, QUANTITATIVE, PREGNANCY - Abnormal; Notable for the following:    hCG, Beta Chain, Quant, S 5 (*)    All other components within normal limits  LACTIC ACID, PLASMA - Abnormal; Notable for the following:    Lactic Acid,  Venous 2.1 (*)    All other components within normal limits  CBC WITH DIFFERENTIAL/PLATELET - Abnormal; Notable for the following:    WBC 28.9 (*)    Hemoglobin 9.0 (*)    HCT 29.4 (*)    MCV 64.9 (*)    MCH 19.9 (*)    MCHC 30.6 (*)    RDW 22.7 (*)    Platelets 134 (*)    Neutro Abs 24.2 (*)    Lymphs Abs 0.9 (*)    Eosinophils Absolute 3.2 (*)    All other components within normal limits  CULTURE, BLOOD (ROUTINE X 2)  CULTURE, BLOOD (ROUTINE X 2)  URINE CULTURE  LACTIC ACID, PLASMA  TROPONIN I  URINALYSIS COMPLETEWITH MICROSCOPIC ([facility] ONLY)     ____________________________________________   EKG  None  ____________________________________________    RADIOLOGY  CXR  IMPRESSION: Consolidative change in the left apical region as seen on the prior CT. No new consolidation  Small left pleural effusion.  Osseous metastatic disease  ____________________________________________   PROCEDURES  Procedure(s) performed: None  Critical Care performed: Yes, see critical care note(s)  CRITICAL CARE Performed by: Nance Pear   Total critical care time: 30 minutes  Critical care time was exclusive of separately billable procedures and treating other patients.  Critical care was necessary to treat or prevent imminent or life-threatening deterioration.  Critical care was  time spent personally by me on the following activities: development of treatment plan with patient and/or surrogate as well as nursing, discussions with consultants, evaluation of patient's response to treatment, examination of patient, obtaining history from patient or surrogate, ordering and performing treatments and interventions, ordering and review of laboratory studies, ordering and review of radiographic studies, pulse oximetry and re-evaluation of patient's condition.  ____________________________________________   INITIAL IMPRESSION / ASSESSMENT AND PLAN / ED COURSE  Pertinent labs & imaging results that were available during my care of the patient were reviewed by me and considered in my medical decision making (see chart for details).  Patient with history of stage IV lung cancer who did recently start chemotherapy who presented to the emergency department today because of concerns for increasing weakness over the past 2-3 weeks. Initial vital signs were concerning for elevated temperature and tachycardia. Code sepsis was called. On exam patient looks somewhat chronically ill and cachectic. He was written for IV fluids and multiple broad-spectrum IV antibiotics. Workup looked to identify the source of the infection. Chest x-ray without any obvious pneumonia. Urine however was not resulted at the time of admission. She was admitted to the hospital service for further workup and management of sepsis.  Patient's tachycardia did improve with fluids. Patient remained normotensive.  ____________________________________________   FINAL CLINICAL IMPRESSION(S) / ED DIAGNOSES  Final diagnoses:  Sepsis, due to unspecified organism [facility])     Note: This dictation was prepared with Dragon dictation. Any transcriptional errors that result from this process are unintentional    Nance Pear, MD 01/12/16 [phone]

## 2016-01-11 NOTE — H&P (Signed)
Luis [name] NAME: Luis [name]    MR#:  [id number]  DATE OF BIRTH:  1965/07/21  DATE OF ADMISSION:  01/11/2016  PRIMARY CARE PHYSICIAN: PROVIDER NOT IN SYSTEM   REQUESTING/REFERRING PHYSICIAN: Archie Balboa, MD  CHIEF COMPLAINT:   Chief Complaint  Patient presents with  . Weakness    HISTORY OF PRESENT ILLNESS:  Luis [name]  is a 50 y.o. male who presents with Complaint of weakness starting today. Patient also states she's had some cough. He has known stage IV cancer and is currently getting chemotherapy treatments. His last treatment was one week ago. On arrival to the ED today he has an elevated white blood cell count and a mild lactic acidosis. He was also febrile. Initial workup did not elucidate an obvious source for his infection. Sepsis protocol was followed in the ED and hospitalists were called for admission.  PAST MEDICAL HISTORY:   Past Medical History  Diagnosis Date  . HTN (hypertension)   . Brain lesion   . 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 History of radiation therapy   . Adenocarcinoma of lung ([place]) 12/05/2014    PAST SURGICAL HISTORY:   Past Surgical History  Procedure Laterality Date  . Laminectomy N/A 03/07/2014    Procedure: T1-T2 Laminectomy with Decompression of Cord and Removal of Cancer  thoracic one/two;  Surgeon: Kristeen Miss, MD;  Location: [place] NEURO ORS;  Service: Neurosurgery;  Laterality: N/A;  . Ct guided biopsy  ([facility] hx)  05/28/2014  . Esophagogastroduodenoscopy N/A 10/01/2015    Procedure: ESOPHAGOGASTRODUODENOSCOPY (EGD);  Surgeon: Hulen Luster, MD;  Location: [facility] ENDOSCOPY;  Service: Endoscopy;  Laterality: N/A;    SOCIAL HISTORY:   Social History  Substance Use Topics  . Smoking status: Never Smoker   . Smokeless tobacco: Never Used  . Alcohol Use: 0.5 oz/week    1 Standard drinks or equivalent per week     Comment: none for a year    FAMILY HISTORY:   Family History  Problem Relation  Age of Onset  . Colon cancer Mother   . Heart disease Father   . Hypertension Sister   . Hypertension Brother     DRUG ALLERGIES:  No Known Allergies  MEDICATIONS AT HOME:   Prior to Admission medications   Medication Sig Start Date End Date Taking? Authorizing Provider  calcium-vitamin D (OSCAL WITH D) 500-200 MG-UNIT tablet Take 1 tablet by mouth 2 (two) times daily. 11/19/15  Yes Cammie Sickle, MD  dexamethasone (DECADRON) 4 MG tablet Take 4 mg by mouth 2 (two) times daily with a meal. Pt is to take for three days starting the day before chemo.   Yes Historical Provider, MD  folic acid (FOLVITE) 1 MG tablet Take 1 tablet (1 mg total) by mouth daily. 12/28/15  Yes Cammie Sickle, MD  ondansetron (ZOFRAN) 8 MG tablet Take 1 tablet (8 mg total) by mouth every 8 (eight) hours as needed for nausea or vomiting. 12/13/15  Yes Cammie Sickle, MD  Oxycodone HCl 10 MG TABS Take 10 mg by mouth every 8 (eight) hours as needed (for severe pain).   Yes Historical Provider, MD  prochlorperazine (COMPAZINE) 10 MG tablet Take 1 tablet (10 mg total) by mouth every 6 (six) hours as needed for nausea or vomiting. 12/28/15  Yes Cammie Sickle, MD    REVIEW OF SYSTEMS:  Review of Systems  Constitutional: Positive for fever and chills. Negative for  weight loss and malaise/fatigue.  HENT: Negative for ear pain, hearing loss and tinnitus.   Eyes: Negative for blurred vision, double vision, pain and redness.  Respiratory: Positive for cough. Negative for hemoptysis and shortness of breath.   Cardiovascular: Negative for chest pain, palpitations, orthopnea and leg swelling.  Gastrointestinal: Positive for nausea and vomiting. Negative for abdominal pain, diarrhea and constipation.  Genitourinary: Negative for dysuria, frequency and hematuria.  Musculoskeletal: Negative for back pain, joint pain and neck pain.  Skin:       No acne, rash, or lesions  Neurological: Positive for weakness.  Negative for dizziness, tremors and focal weakness.  Endo/Heme/Allergies: Negative for polydipsia. Does not bruise/bleed easily.  Psychiatric/Behavioral: Negative for depression. The patient is not nervous/anxious and does not have insomnia.      VITAL SIGNS:   Filed Vitals:   01/11/16 1919  BP: 107/63  Pulse: 132  Temp: 100.4 F (38 C)  TempSrc: Oral  Resp: 16  Height: '5\' 7"'$  (1.702 m)  Weight: 55.792 kg (123 lb)  SpO2: 99%   Wt Readings from Last 3 Encounters:  01/11/16 55.792 kg (123 lb)  01/03/16 56.95 kg (125 lb 8.8 oz)  12/28/15 55.5 kg (122 lb 5.7 oz)    PHYSICAL EXAMINATION:  Physical Exam  Vitals reviewed. Constitutional: He is oriented to person, place, and time. He appears well-developed and well-nourished. No distress.  HENT:  Head: Normocephalic and atraumatic.  Mouth/Throat: Oropharynx is clear and moist.  Eyes: Conjunctivae and EOM are normal. Pupils are equal, round, and reactive to light. No scleral icterus.  Neck: Normal range of motion. Neck supple. No JVD present. No thyromegaly present.  Cardiovascular: Normal rate, regular rhythm and intact distal pulses.  Exam reveals no gallop and no friction rub.   No murmur heard. Respiratory: Effort normal and breath sounds normal. No respiratory distress. He has no wheezes. He has no rales.  GI: Soft. Bowel sounds are normal. He exhibits no distension. There is no tenderness.  Musculoskeletal: Normal range of motion. He exhibits no edema.  No arthritis, no gout  Lymphadenopathy:    He has no cervical adenopathy.  Neurological: He is alert and oriented to person, place, and time. No cranial nerve deficit.  No dysarthria, no aphasia  Skin: Skin is warm and dry. No rash noted. No erythema.  Psychiatric: He has a normal mood and affect. His behavior is normal. Judgment and thought content normal.    LABORATORY PANEL:   CBC  Recent Labs Lab 01/11/16 1937  WBC 28.9*  HGB 9.0*  HCT 29.4*  PLT 134*    ------------------------------------------------------------------------------------------------------------------  Chemistries   Recent Labs Lab 01/11/16 1937  NA 133*  K 3.6  CL 99*  CO2 24  GLUCOSE 112*  BUN 14  CREATININE 0.80  CALCIUM 8.0*  AST 19  ALT 28  ALKPHOS 235*  BILITOT 0.7   ------------------------------------------------------------------------------------------------------------------  Cardiac Enzymes  Recent Labs Lab 01/11/16 1937  TROPONINI <0.03   ------------------------------------------------------------------------------------------------------------------  RADIOLOGY:  Dg Chest 2 View  01/11/2016  CLINICAL DATA:  50 year old male with history of metastatic lung cancer presenting with weakness and possible sepsis EXAM: CHEST  2 VIEW COMPARISON:  Chest CT dated 12/25/2015 FINDINGS: Two views of the chest demonstrate focal area of consolidative change in the left apical region medially. This may represent an area of postradiation, however pneumonia or underlying mass is not excluded. There is a small left pleural effusion, likely malignant. The right lung is clear. There is no pneumothorax. The  cardiac silhouette is within normal limits. There are multiple osseous sclerotic lesions. There is expansile sclerotic lesion involving the right posterior seventh rib as well as multiple left ribs compatible with metastatic disease. No acute fracture identified. IMPRESSION: Consolidative change in the left apical region as seen on the prior CT. No new consolidation Small left pleural effusion. Osseous metastatic disease Electronically Signed   By: Anner Crete M.D.   On: 01/11/2016 19:57    EKG:   Orders placed or performed in visit on 03/06/14  . EKG 12-Lead  . EKG 12-Lead  . EKG 12-Lead    IMPRESSION AND PLAN:  Principal Problem:   Sepsis ([place]) - unclear source at this time, though his chest x-ray does show an opacity which is stable and related to  his cancer, but could be potentially masking a pneumonia. Cultures ordered in the ED. Blood cultures sent, still waiting on urine sample from the patient. Broad spectrum antibiotics started and continued on admission. Lactic acid was mildly elevated, will give IV fluids and recheck. Patient is hemodynamically stable. Active Problems:   Essential hypertension, benign - currently normotensive, hold antihypertensives at this time.   Metastatic primary lung cancer ([place]) - currently getting chemotherapy, likely contributory to his infection and sepsis.  All the records are reviewed and case discussed with ED provider. Management plans discussed with the patient and/or family.  DVT PROPHYLAXIS: SubQ lovenox  GI PROPHYLAXIS: None  ADMISSION STATUS: Inpatient  CODE STATUS: Full Code Status History    Date Active Date Inactive Code Status Order ID Comments User Context   09/30/2015  7:53 PM 10/03/2015  4:32 PM Full Code [id number]  Idelle Crouch, MD Inpatient   09/10/2015  1:58 PM 09/12/2015  9:13 PM Full Code [id number]  Loletha Grayer, MD ED   05/29/2015 10:13 AM 05/30/2015  3:29 AM Full Code [id number]  Inez Catalina, MD [place]   03/07/2014  7:04 PM 03/11/2014  6:11 PM Full Code [id number]  Kristeen Miss, MD Inpatient   03/06/2014 10:45 PM 03/07/2014  7:04 PM Full Code [id number]  Shanda Howells, MD Inpatient      TOTAL TIME TAKING CARE OF THIS PATIENT: 45 minutes.    Carly Applegate [place] 01/11/2016, 11:12 PM  [employer] [facility]  Office  [phone]  CC: Primary care physician; PROVIDER NOT IN SYSTEM

## 2016-01-11 NOTE — ED Notes (Signed)
Dr Archie Balboa notified of lactic 2.1

## 2016-01-11 NOTE — ED Notes (Signed)
EDP notified of elevated lactic acid.

## 2016-01-11 NOTE — ED Notes (Signed)
Patient presents with c/o weakness causing him to be unable to ambulate well x 2-3 weeks; worse today. Of note, patient is a stage IV lung cancer patient.

## 2016-01-11 NOTE — ED Notes (Signed)
Pt educated to call when he needs to urinate.  Given urinal, but educated that urine sample is needed.  Voiced understanding.

## 2016-01-12 ENCOUNTER — Inpatient Hospital Stay: Payer: BLUE CROSS/BLUE SHIELD

## 2016-01-12 DIAGNOSIS — E440 Moderate protein-calorie malnutrition: Secondary | ICD-10-CM | POA: Insufficient documentation

## 2016-01-12 LAB — URINALYSIS COMPLETE WITH MICROSCOPIC (ARMC ONLY)
BILIRUBIN URINE: NEGATIVE
GLUCOSE, UA: NEGATIVE mg/dL
Hgb urine dipstick: NEGATIVE
Ketones, ur: NEGATIVE mg/dL
LEUKOCYTES UA: NEGATIVE
NITRITE: NEGATIVE
Protein, ur: NEGATIVE mg/dL
SQUAMOUS EPITHELIAL / LPF: NONE SEEN
Specific Gravity, Urine: 1.013 (ref 1.005–1.030)
pH: 6 (ref 5.0–8.0)

## 2016-01-12 LAB — BASIC METABOLIC PANEL
ANION GAP: 6 (ref 5–15)
BUN: 10 mg/dL (ref 6–20)
CHLORIDE: 106 mmol/L (ref 101–111)
CO2: 22 mmol/L (ref 22–32)
Calcium: 7.2 mg/dL — ABNORMAL LOW (ref 8.9–10.3)
Creatinine, Ser: 0.7 mg/dL (ref 0.61–1.24)
GFR calc Af Amer: >60 mL/min (ref >60)
GLUCOSE: 83 mg/dL (ref 65–99)
POTASSIUM: 3.8 mmol/L (ref 3.5–5.1)
Sodium: 134 mmol/L — ABNORMAL LOW (ref 135–145)

## 2016-01-12 LAB — CBC
HEMATOCRIT: 24 % — AB (ref 40.0–52.0)
HEMOGLOBIN: 7.4 g/dL — AB (ref 13.0–18.0)
MCH: 20.1 pg — ABNORMAL LOW (ref 26.0–34.0)
MCHC: 31 g/dL — AB (ref 32.0–36.0)
MCV: 64.9 fL — AB (ref 80.0–100.0)
Platelets: 123 10*3/uL — ABNORMAL LOW (ref 150–440)
RBC: 3.69 MIL/uL — ABNORMAL LOW (ref 4.40–5.90)
RDW: 22.5 % — ABNORMAL HIGH (ref 11.5–14.5)
WBC: 25.7 10*3/uL — ABNORMAL HIGH (ref 3.8–10.6)

## 2016-01-12 MED ORDER — MORPHINE SULFATE (PF) 2 MG/ML IV SOLN: 2.0000 mg | INTRAVENOUS | Status: DC | PRN

## 2016-01-12 MED ORDER — VANCOMYCIN HCL IN DEXTROSE 750-5 MG/150ML-% IV SOLN
750.0000 mg | Freq: Two times a day (BID) | INTRAVENOUS | Status: DC
  Administered 2016-01-12 – 2016-01-13 (×3): 750 mg via INTRAVENOUS
  Filled 2016-01-12 (×5): qty 150

## 2016-01-12 MED ORDER — OXYCODONE HCL 5 MG PO TABS: 10.0000 mg | ORAL_TABLET | Freq: Three times a day (TID) | ORAL | Status: DC | PRN

## 2016-01-12 MED ORDER — ENSURE ENLIVE PO LIQD
237.0000 mL | Freq: Three times a day (TID) | ORAL | Status: DC
  Administered 2016-01-12 – 2016-01-16 (×14): 237 mL via ORAL

## 2016-01-12 MED ORDER — ACETAMINOPHEN 325 MG PO TABS: 650.0000 mg | ORAL_TABLET | Freq: Four times a day (QID) | ORAL | Status: DC | PRN

## 2016-01-12 MED ORDER — ACETAMINOPHEN 650 MG RE SUPP: 650.0000 mg | Freq: Four times a day (QID) | RECTAL | Status: DC | PRN

## 2016-01-12 MED ORDER — ENOXAPARIN SODIUM 40 MG/0.4ML ~~LOC~~ SOLN
40.0000 mg | SUBCUTANEOUS | Status: DC
  Administered 2016-01-12 – 2016-01-16 (×5): 40 mg via SUBCUTANEOUS
  Filled 2016-01-12 (×5): qty 0.4

## 2016-01-12 MED ORDER — MOXIFLOXACIN HCL 0.5 % OP SOLN
1.0000 [drp] | Freq: Three times a day (TID) | OPHTHALMIC | Status: DC
  Administered 2016-01-12 – 2016-01-16 (×13): 1 [drp] via OPHTHALMIC
  Filled 2016-01-12: qty 3

## 2016-01-12 MED ORDER — ONDANSETRON HCL 4 MG PO TABS: 4.0000 mg | ORAL_TABLET | Freq: Four times a day (QID) | ORAL | Status: DC | PRN

## 2016-01-12 MED ORDER — PIPERACILLIN-TAZOBACTAM 3.375 G IVPB
3.3750 g | Freq: Three times a day (TID) | INTRAVENOUS | Status: DC
  Administered 2016-01-12 – 2016-01-16 (×14): 3.375 g via INTRAVENOUS
  Filled 2016-01-12 (×17): qty 50

## 2016-01-12 MED ORDER — SODIUM CHLORIDE 0.9% FLUSH
3.0000 mL | Freq: Two times a day (BID) | INTRAVENOUS | Status: DC
  Administered 2016-01-12 – 2016-01-16 (×10): 3 mL via INTRAVENOUS

## 2016-01-12 MED ORDER — ONDANSETRON HCL 4 MG/2ML IJ SOLN: 4.0000 mg | Freq: Four times a day (QID) | INTRAMUSCULAR | Status: DC | PRN

## 2016-01-12 MED ORDER — SODIUM CHLORIDE 0.9 % IV SOLN
INTRAVENOUS | Status: AC
  Administered 2016-01-12: 01:00:00 via INTRAVENOUS

## 2016-01-12 NOTE — Evaluation (Signed)
Physical Therapy Evaluation Patient Details Name: Luis [name] MRN: [id number] DOB: 08-Jul-1966 Today's Date: 01/12/2016   History of Present Illness  Pt is a 50 y/o male with PMH og metastatic cancer (lung, spine, brain) recently started on chemotherapy with history of radiation therapy. He presents with fever, chills, and weakness at home.   Clinical Impression  Pt has a history of metastatic cancer, with recent onset of chemotherapy treatments. He has been noticing increasing weakness at home with difficulty ambulating. He is able to transfer to EOB with min A x1, however no other physical assistance required. He takes several short steps initially demonstrating high falls risk, once provided with RW no further deficits noted aside from fatigue/deconditioning. Patient would benefit from HHPT to address the fatigue/deconditioning noted above.     Follow Up Recommendations Home health PT    Equipment Recommendations  Rolling walker with 5" wheels    Recommendations for Other Services       Precautions / Restrictions Precautions Precautions: Fall Restrictions Weight Bearing Restrictions: No      Mobility  Bed Mobility Overal bed mobility: Needs Assistance Bed Mobility: Supine to Sit     Supine to sit: Min guard;Min assist     General bed mobility comments: Patient used bed rail, very minimal assistance to bring LEs to edge of bed and trunk elevated.   Transfers Overall transfer level: Needs assistance Equipment used: None Transfers: Sit to/from Stand Sit to Stand: Min guard         General transfer comment: No loss of balance in transfer, no device used.   Ambulation/Gait Ambulation/Gait assistance: Min guard Ambulation Distance (Feet): 120 Feet Assistive device: Rolling walker (2 wheeled) Gait Pattern/deviations: Decreased step length - left;Decreased step length - right;Trunk flexed   Gait velocity interpretation: Below normal speed for age/gender General Gait  Details: Patient initially ambulates w/o AD, decreased step length noted thus provided with RW. With RW gait speed and step length markedly increased, no further loss of balance noted.   Stairs            Wheelchair Mobility    Modified Rankin (Stroke Patients Only)       Balance Overall balance assessment: Needs assistance Sitting-balance support: No upper extremity supported Sitting balance-Leahy Scale: Fair     Standing balance support: Bilateral upper extremity supported Standing balance-Leahy Scale: Fair                               Pertinent Vitals/Pain Pain Assessment:  (He reports pain in his LEs, he states it comes and goes, he thinks perhaps due to chemotherapy)    Home Living Family/patient expects to be discharged to:: Private residence Living Arrangements: Spouse/significant other Available Help at Discharge: Family Type of Home: Apartment Home Access: Level entry     Home Layout: One level Home Equipment: Environmental consultant - 2 wheels Additional Comments: Patient reports he has a RW at home?     Prior Function Level of Independence: Independent         Comments: Patient reports he has been progressively weaker over the past week, difficulty ambulating around the home.      Hand Dominance   Dominant Hand: Right    Extremity/Trunk Assessment   Upper Extremity Assessment: Overall WFL for tasks assessed (Generally weak, but able to complete tasks required)           Lower Extremity Assessment: Overall WFL for tasks assessed (Generally  weak, but able to complete tasks required)         Communication   Communication: No difficulties  Cognition Arousal/Alertness: Awake/alert Behavior During Therapy: WFL for tasks assessed/performed Overall Cognitive Status: Within Functional Limits for tasks assessed                      General Comments      Exercises        Assessment/Plan    PT Assessment Patient needs continued  PT services  PT Diagnosis Difficulty walking;Generalized weakness   PT Problem List Decreased strength;Decreased knowledge of use of DME;Decreased balance;Decreased activity tolerance;Decreased mobility;Cardiopulmonary status limiting activity  PT Treatment Interventions DME instruction;Gait training;Stair training;Therapeutic activities;Therapeutic exercise;Balance training   PT Goals (Current goals can be found in the Care Plan section) Acute Rehab PT Goals Patient Stated Goal: To return home  PT Goal Formulation: With patient Time For Goal Achievement: 01/26/16 Potential to Achieve Goals: Good    Frequency Min 2X/week   Barriers to discharge        Co-evaluation               End of Session Equipment Utilized During Treatment: Gait belt Activity Tolerance: Patient tolerated treatment well;Patient limited by fatigue Patient left: in chair;with call bell/phone within reach;with chair alarm set Nurse Communication: Mobility status         Time: 7078-6754 PT Time Calculation (min) (ACUTE ONLY): 17 min   Charges:   PT Evaluation $PT Eval Low Complexity: 1 Procedure     PT G Codes:       Kerman Passey, PT, DPT    01/12/2016, 2:24 PM

## 2016-01-12 NOTE — Plan of Care (Signed)
Problem: Physical Regulation: Goal: Signs and symptoms of infection will decrease Outcome: Progressing Remains on IV Antibiotics. Improvement noted.

## 2016-01-12 NOTE — Progress Notes (Signed)
Pharmacy Antibiotic Note  Pearse Trouten is a 50 y.o. male admitted on 01/11/2016 with sepsis.  Pharmacy has been consulted for Vancomycin and Zosyn dosing.  Ke=0.075 T1/2=9.2hr Vd=39L  Plan: Vancomycin '750mg'$  IV every 12 hours.  Goal trough 15-20 mcg/mL. Zosyn 3.375g IV q8h (4 hour infusion). Will check a trough level prior to 5th dose.  Height: '5\' 7"'$  (170.2 cm) Weight: 123 lb (55.792 kg) IBW/kg (Calculated) : 66.1  Temp (24hrs), Avg:100.4 F (38 C), Min:100.4 F (38 C), Max:100.4 F (38 C)   Recent Labs Lab 01/11/16 1937 01/11/16 2255  WBC 28.9*  --   CREATININE 0.80  --   LATICACIDVEN 2.1* 1.3    Estimated Creatinine Clearance: 85.3 mL/min (by C-G formula based on Cr of 0.8).    No Known Allergies  Antimicrobials this admission: Vancomycin 6/30 >>  Zosyn 6/30 >>   Dose adjustments this admission:  Microbiology results:  Thank you for allowing pharmacy to be a part of this patient's care.  Paulina Fusi, PharmD, BCPS 01/12/2016 12:54 AM

## 2016-01-12 NOTE — Progress Notes (Signed)
[place] [facility] [place] NAME: Luis [name]    MR#:  [id number]  DATE OF BIRTH:  21-May-1966  SUBJECTIVE: Patient is seen at bedside. Admitted for weakness, started on   IV hydration, IV antibiotics for  Presumed pneumonia. He feels better today..  Patient had fever, chills at home.   CHIEF COMPLAINT:   Chief Complaint  Patient presents with  . Weakness    REVIEW OF SYSTEMS:   ROS CONSTITUTIONAL: has a day, generalized weakness, mostly in the bed all the time even at home also has weakness in both hands and according to him it is not new. EYES: No blurred or double vision.  EARS, NOSE, AND THROAT: No tinnitus or ear pain.  RESPIRATORY: No cough, shortness of breath, wheezing or hemoptysis.  CARDIOVASCULAR: No chest pain, orthopnea, edema.  GASTROINTESTINAL: No nausea, vomiting, . Complains of diarrhea.(Chronic).  GENITOURINARY: No dysuria, hematuria.  ENDOCRINE: No polyuria, nocturia,  HEMATOLOGY: No anemia, easy bruising or bleeding SKIN: No rash or lesion. MUSCULOSKELETAL: No joint pain or arthritis.   NEUROLOGIC: No tingling, numbness, weakness.  PSYCHIATRY: No anxiety or depression.   DRUG ALLERGIES:  No Known Allergies  VITALS:  Blood pressure 101/62, pulse 98, temperature 98.6 F (37 C), temperature source Oral, resp. rate 16, height '5\' 7"'$  (1.702 m), weight 55.792 kg (123 lb), SpO2 98 %.  PHYSICAL EXAMINATION:  GENERAL:  50 y.o.-year-old patient lying in the bed with no acute distress.  EYES: Pupils equal, round, reactive to light and accommodation. No scleral icterus. Extraocular muscles intact.  HEENT: Head atraumatic, normocephalic. Oropharynx and nasopharynx clear.  NECK:  Supple, no jugular venous distention. No thyroid enlargement, no tenderness.  LUNGS: Normal breath sounds bilaterally, no wheezing, rales,rhonchi or crepitation. No use of accessory muscles of respiration.  CARDIOVASCULAR: S1, S2 normal. No murmurs,  rubs, or gallops.  ABDOMEN: Soft, nontender, nondistended. Bowel sounds present. No organomegaly or mass.  EXTREMITIES: No pedal edema, cyanosis, or clubbing.  NEUROLOGIC: Cranial nerves II through XII are intact. Muscle strength 5/5 in all extremities. Sensation intact. Gait not checked.  PSYCHIATRIC: The patient is alert and oriented x 3.  SKIN: No obvious rash, lesion, or ulcer.    LABORATORY PANEL:   CBC  Recent Labs Lab 01/12/16 0418  WBC 25.7*  HGB 7.4*  HCT 24.0*  PLT 123*   ------------------------------------------------------------------------------------------------------------------  Chemistries   Recent Labs Lab 01/11/16 1937 01/12/16 0418  NA 133* 134*  K 3.6 3.8  CL 99* 106  CO2 24 22  GLUCOSE 112* 83  BUN 14 10  CREATININE 0.80 0.70  CALCIUM 8.0* 7.2*  AST 19  --   ALT 28  --   ALKPHOS 235*  --   BILITOT 0.7  --    ------------------------------------------------------------------------------------------------------------------  Cardiac Enzymes  Recent Labs Lab 01/11/16 1937  TROPONINI <0.03   ------------------------------------------------------------------------------------------------------------------  RADIOLOGY:  Dg Chest 2 View  01/11/2016  CLINICAL DATA:  50 year old male with history of metastatic lung cancer presenting with weakness and possible sepsis EXAM: CHEST  2 VIEW COMPARISON:  Chest CT dated 12/25/2015 FINDINGS: Two views of the chest demonstrate focal area of consolidative change in the left apical region medially. This may represent an area of postradiation, however pneumonia or underlying mass is not excluded. There is a small left pleural effusion, likely malignant. The right lung is clear. There is no pneumothorax. The cardiac silhouette is within normal limits. There are multiple osseous sclerotic lesions. There is expansile sclerotic  lesion involving the right posterior seventh rib as well as multiple left ribs compatible  with metastatic disease. No acute fracture identified. IMPRESSION: Consolidative change in the left apical region as seen on the prior CT. No new consolidation Small left pleural effusion. Osseous metastatic disease Electronically Signed   By: Anner Crete M.D.   On: 01/11/2016 19:57    EKG:   Orders placed or performed in visit on 03/06/14  . EKG 12-Lead  . EKG 12-Lead  . EKG 12-Lead    ASSESSMENT AND PLAN:  50 year old male patient with metastatic lung cancer with weakness, sepsis of unclear source: Started on IV antibiotics, IV hydration. Pt  feels better. He IV hydration, IV antibiotics, repeat chest x-ray today. Initial chest x-ray is negative for any pneumonia. CBC improved to 25.7. Lactic acid down to 1.3.wbc is elevated  likley due to neulesta.  Also  his symptoms could be due to his cancer. He is getting radiation therapy for his bone disease. 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 3.history of radiation pneumonitis: #4 physical therapy today #5 likely discharge tomorrow. All the records are reviewed and case discussed with Care Management/Social Workerr. Management plans discussed with the patient, family and they are in agreement.  CODE STATUS: full  TOTAL TIME TAKING CARE OF THIS PATIENT: 29mnutes.   POSSIBLE D/C IN 1-2 DAYS, DEPENDING ON CLINICAL CONDITION.   KEpifanio LeschesM.D on 01/12/2016 at 9:56 AM  Between 7am to 6pm - Pager - [phone]  After 6pm go to [email or web address] - password [email or web address]  Office  [phone] CC: Primary care physician; PROVIDER NOT IN SYSTEM   Note: This dictation was prepared with Dragon dictation along with smaller phrase technology. Any transcriptional errors that result from this process are unintentional.

## 2016-01-12 NOTE — Progress Notes (Signed)
Initial Nutrition Assessment  DOCUMENTATION CODES:   Non-severe (moderate) malnutrition in context of chronic illness  INTERVENTION:  -Recommend Ensure Enlive po TID between meals, each supplement provides 350 kcal and 20 grams of protein; pt likes Vanilla -Encourage small, frequent meals; snacks ordered -Agree with Regular diet, cater to pt preferences  NUTRITION DIAGNOSIS:   Malnutrition related to cancer and cancer related treatments, chronic illness as evidenced by moderate depletions of muscle mass, moderate depletion of body fat, percent weight loss.  GOAL:   Patient will meet greater than or equal to 90% of their needs  MONITOR:   PO intake, Supplement acceptance, Labs, Weight trends  REASON FOR ASSESSMENT:   Malnutrition Screening Tool    ASSESSMENT:    50 yo male admitted with weakness, sepsis.  Pt with hx of metastatic lung cancer with mets to bones, pt currently receiving chemotherapy, per chart review, pt dx with cancer in May 2016  Pt reports appetite is not good, can only eat small amounts before feeling full. Pt tries to eat smaller meals more frequently; pt also drinks Ensure 1-2 times per day. Pt reports he has lost a few pounds recently  Nutrition-Focused physical exam completed. Findings are mild/moderate fat depletion, mild/moderate muscle depletion, and no edema.    Past Medical History  Diagnosis Date  . HTN (hypertension)   . Brain lesion   . 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 History of radiation therapy   . 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 Pt appeared to have weighed 140 pound in November of 2016 (12% wt loss in 7 months, weighed 139 pounds in January (11.5% wt loss in <6 months)  Wt Readings from Last 1 Encounters:  01/11/16 123 lb (55.792 kg)    Wt Readings from Last 10 Encounters:  01/11/16 123 lb (55.792 kg)  01/03/16 125 lb 8.8 oz (56.95 kg)  12/28/15 122 lb 5.7 oz (55.5 kg)  12/13/15 121 lb (54.885 kg)  11/15/15 123 lb 3.8 oz (55.9 kg)  11/05/15 122 lb 0.4 oz (55.35 kg)  10/31/15 121 lb 11.1 oz (55.2 kg)  10/24/15 124 lb 5.4 oz (56.4 kg)  10/12/15 126 lb 12.2 oz (57.5 kg)  10/03/15 130 lb 3.2 oz (59.058 kg)    BMI:  Body mass index is 19.26 kg/(m^2).  Estimated Nutritional Needs:   Kcal:  1751-0258 kcals   Protein:  67-84 g  Fluid:  >/= 1.7 L  EDUCATION NEEDS:   No education needs identified at this time  [place], [place], [place] [phone] Pager  [phone] Weekend/On-Call Pager

## 2016-01-13 DIAGNOSIS — R197 Diarrhea, unspecified: Secondary | ICD-10-CM | POA: Insufficient documentation

## 2016-01-13 DIAGNOSIS — R0609 Other forms of dyspnea: Secondary | ICD-10-CM

## 2016-01-13 DIAGNOSIS — R531 Weakness: Secondary | ICD-10-CM

## 2016-01-13 DIAGNOSIS — C3411 Malignant neoplasm of upper lobe, right bronchus or lung: Secondary | ICD-10-CM

## 2016-01-13 DIAGNOSIS — Z9221 Personal history of antineoplastic chemotherapy: Secondary | ICD-10-CM

## 2016-01-13 DIAGNOSIS — C7951 Secondary malignant neoplasm of bone: Secondary | ICD-10-CM

## 2016-01-13 DIAGNOSIS — R630 Anorexia: Secondary | ICD-10-CM

## 2016-01-13 DIAGNOSIS — K521 Toxic gastroenteritis and colitis: Secondary | ICD-10-CM

## 2016-01-13 DIAGNOSIS — Z923 Personal history of irradiation: Secondary | ICD-10-CM

## 2016-01-13 DIAGNOSIS — C7931 Secondary malignant neoplasm of brain: Secondary | ICD-10-CM

## 2016-01-13 DIAGNOSIS — C7802 Secondary malignant neoplasm of left lung: Secondary | ICD-10-CM

## 2016-01-13 DIAGNOSIS — D72829 Elevated white blood cell count, unspecified: Secondary | ICD-10-CM

## 2016-01-13 DIAGNOSIS — M5489 Other dorsalgia: Secondary | ICD-10-CM

## 2016-01-13 DIAGNOSIS — E46 Unspecified protein-calorie malnutrition: Secondary | ICD-10-CM

## 2016-01-13 DIAGNOSIS — T451X5S Adverse effect of antineoplastic and immunosuppressive drugs, sequela: Secondary | ICD-10-CM

## 2016-01-13 DIAGNOSIS — R112 Nausea with vomiting, unspecified: Secondary | ICD-10-CM

## 2016-01-13 DIAGNOSIS — H02402 Unspecified ptosis of left eyelid: Secondary | ICD-10-CM

## 2016-01-13 LAB — URINE CULTURE: Culture: NO GROWTH

## 2016-01-13 LAB — GASTROINTESTINAL PANEL BY PCR, STOOL (REPLACES STOOL CULTURE)
ADENOVIRUS F40/41: NOT DETECTED
ASTROVIRUS: NOT DETECTED
CAMPYLOBACTER SPECIES: NOT DETECTED
Cryptosporidium: NOT DETECTED
Cyclospora cayetanensis: NOT DETECTED
E. coli O157: NOT DETECTED
ENTEROAGGREGATIVE E COLI (EAEC): NOT DETECTED
ENTEROPATHOGENIC E COLI (EPEC): NOT DETECTED
Entamoeba histolytica: NOT DETECTED
Enterotoxigenic E coli (ETEC): NOT DETECTED
GIARDIA LAMBLIA: NOT DETECTED
NOROVIRUS GI/GII: NOT DETECTED
PLESIMONAS SHIGELLOIDES: NOT DETECTED
Rotavirus A: NOT DETECTED
SHIGELLA/ENTEROINVASIVE E COLI (EIEC): NOT DETECTED
Salmonella species: NOT DETECTED
Sapovirus (I, II, IV, and V): NOT DETECTED
Shiga like toxin producing E coli (STEC): NOT DETECTED
Vibrio cholerae: NOT DETECTED
Vibrio species: NOT DETECTED
Yersinia enterocolitica: NOT DETECTED

## 2016-01-13 LAB — VANCOMYCIN, TROUGH: Vancomycin Tr: 8 ug/mL — ABNORMAL LOW (ref 15–20)

## 2016-01-13 LAB — CBC
HCT: 24.1 % — ABNORMAL LOW (ref 40.0–52.0)
Hemoglobin: 7.6 g/dL — ABNORMAL LOW (ref 13.0–18.0)
MCH: 20.6 pg — AB (ref 26.0–34.0)
MCHC: 31.7 g/dL — AB (ref 32.0–36.0)
MCV: 64.9 fL — ABNORMAL LOW (ref 80.0–100.0)
PLATELETS: 148 10*3/uL — AB (ref 150–440)
RBC: 3.71 MIL/uL — AB (ref 4.40–5.90)
RDW: 22.7 % — ABNORMAL HIGH (ref 11.5–14.5)
WBC: 25.9 10*3/uL — ABNORMAL HIGH (ref 3.8–10.6)

## 2016-01-13 LAB — C DIFFICILE QUICK SCREEN W PCR REFLEX
C DIFFICILE (CDIFF) TOXIN: NEGATIVE
C DIFFICLE (CDIFF) ANTIGEN: NEGATIVE
C Diff interpretation: NEGATIVE

## 2016-01-13 MED ORDER — SODIUM CHLORIDE 0.9 % IV SOLN
INTRAVENOUS | Status: DC
  Administered 2016-01-13 – 2016-01-14 (×2): via INTRAVENOUS

## 2016-01-13 MED ORDER — PREDNISONE 10 MG PO TABS
10.0000 mg | ORAL_TABLET | Freq: Every day | ORAL | Status: DC
  Administered 2016-01-13: 10:00:00 10 mg via ORAL
  Filled 2016-01-13: qty 1

## 2016-01-13 MED ORDER — VANCOMYCIN HCL IN DEXTROSE 750-5 MG/150ML-% IV SOLN
750.0000 mg | Freq: Three times a day (TID) | INTRAVENOUS | Status: DC
  Administered 2016-01-13 – 2016-01-14 (×3): 750 mg via INTRAVENOUS
  Filled 2016-01-13 (×6): qty 150

## 2016-01-13 MED ORDER — LOPERAMIDE HCL 2 MG PO CAPS
2.0000 mg | ORAL_CAPSULE | ORAL | Status: DC | PRN
  Administered 2016-01-13 – 2016-01-16 (×3): 2 mg via ORAL
  Filled 2016-01-13: qty 2
  Filled 2016-01-13 (×3): qty 1

## 2016-01-13 MED ORDER — PREDNISONE 20 MG PO TABS
60.0000 mg | ORAL_TABLET | Freq: Every day | ORAL | Status: DC
  Administered 2016-01-13 – 2016-01-16 (×4): 60 mg via ORAL
  Filled 2016-01-13 (×4): qty 3

## 2016-01-13 MED ORDER — FAMOTIDINE IN NACL 20-0.9 MG/50ML-% IV SOLN
20.0000 mg | Freq: Two times a day (BID) | INTRAVENOUS | Status: DC
  Administered 2016-01-13 – 2016-01-14 (×3): 20 mg via INTRAVENOUS
  Filled 2016-01-13 (×4): qty 50

## 2016-01-13 MED ORDER — DIPHENOXYLATE-ATROPINE 2.5-0.025 MG PO TABS: 1.0000 | ORAL_TABLET | Freq: Four times a day (QID) | ORAL | Status: DC | PRN

## 2016-01-13 NOTE — Plan of Care (Signed)
Problem: Activity: Goal: Risk for activity intolerance will decrease Outcome: Progressing Pt sat up in chair for 3 hours today  Problem: Fluid Volume: Goal: Ability to maintain a balanced intake and output will improve Outcome: Progressing IVF restarted due to diarrhea and minimal intake of PO fluids  Problem: Nutrition: Goal: Adequate nutrition will be maintained Outcome: Not Progressing Poor appetite. Ensure supplements between meals.

## 2016-01-13 NOTE — Progress Notes (Signed)
Negative C. Diff result, enteric precautions removed.

## 2016-01-13 NOTE — Progress Notes (Signed)
Pt walked 1 lap around unit, tolerated well w/ rolling walker. Some SOB noted but improved from yesterday.

## 2016-01-13 NOTE — Consult Note (Signed)
Pharmacy Antibiotic Note  Luis [name] is a 50 y.o. male admitted on 01/11/2016 with sepsis.  Pharmacy has been consulted for vancomycin/zosyn dosing.  Plan: Zosyn 3.375g IV q8h (4 hour infusion).  VT resulted at 8. Current dose is vancomycin '750mg'$  q 12 hours.  Will change dose to vancomycin '750mg'$  q 8 hours. Goal trough 15-20 Will recheck trough at new steady state. 7/3 @ 1745  Height: '5\' 7"'$  (170.2 cm) Weight: 123 lb (55.792 kg) IBW/kg (Calculated) : 66.1  Temp (24hrs), Avg:99 F (37.2 C), Min:98.7 F (37.1 C), Max:99.2 F (37.3 C)   Recent Labs Lab 01/11/16 1937 01/11/16 2255 01/12/16 0418 01/13/16 0727 01/13/16 1622  WBC 28.9*  --  25.7* 25.9*  --   CREATININE 0.80  --  0.70  --   --   LATICACIDVEN 2.1* 1.3  --   --   --   VANCOTROUGH  --   --   --   --  8*    Estimated Creatinine Clearance: 85.3 mL/min (by C-G formula based on Cr of 0.7).    No Known Allergies  Antimicrobials this admission: Vancomycin 6/30 >>  Zosyn 6/30 >>   Dose adjustments this admission:   Microbiology results: Recent Results (from the past 240 hour(s))  Culture, blood (Routine x 2)     Status: None (Preliminary result)   Collection Time: 01/11/16  7:37 PM  Result Value Ref Range Status   Specimen Description BLOOD BLOOD RIGHT WRIST  Final   Special Requests BOTTLES DRAWN AEROBIC AND ANAEROBIC 8CC  Final   Culture NO GROWTH 2 DAYS  Final   Report Status PENDING  Incomplete  Culture, blood (Routine x 2)     Status: None (Preliminary result)   Collection Time: 01/11/16  7:37 PM  Result Value Ref Range Status   Specimen Description BLOOD FEMORAL ARTERY  Final   Special Requests BOTTLES DRAWN AEROBIC AND ANAEROBIC 8CC  Final   Culture NO GROWTH 2 DAYS  Final   Report Status PENDING  Incomplete  Urine culture     Status: None   Collection Time: 01/12/16  4:20 AM  Result Value Ref Range Status   Specimen Description URINE, RANDOM  Final   Special Requests NONE  Final   Culture NO  GROWTH Performed at [facility]   Final   Report Status 01/13/2016 FINAL  Final  Gastrointestinal Panel by PCR , Stool     Status: None   Collection Time: 01/13/16  2:37 PM  Result Value Ref Range Status   Campylobacter species NOT DETECTED NOT DETECTED Final   Plesimonas shigelloides NOT DETECTED NOT DETECTED Final   Salmonella species NOT DETECTED NOT DETECTED Final   Yersinia enterocolitica NOT DETECTED NOT DETECTED Final   Vibrio species NOT DETECTED NOT DETECTED Final   Vibrio cholerae NOT DETECTED NOT DETECTED Final   Enteroaggregative E coli (EAEC) NOT DETECTED NOT DETECTED Final   Enteropathogenic E coli (EPEC) NOT DETECTED NOT DETECTED Final   Enterotoxigenic E coli (ETEC) NOT DETECTED NOT DETECTED Final   Shiga like toxin producing E coli (STEC) NOT DETECTED NOT DETECTED Final   E. coli O157 NOT DETECTED NOT DETECTED Final   Shigella/Enteroinvasive E coli (EIEC) NOT DETECTED NOT DETECTED Final   Cryptosporidium NOT DETECTED NOT DETECTED Final   Cyclospora cayetanensis NOT DETECTED NOT DETECTED Final   Entamoeba histolytica NOT DETECTED NOT DETECTED Final   Giardia lamblia NOT DETECTED NOT DETECTED Final   Adenovirus F40/41 NOT DETECTED NOT DETECTED Final  Astrovirus NOT DETECTED NOT DETECTED Final   Norovirus GI/GII NOT DETECTED NOT DETECTED Final   Rotavirus A NOT DETECTED NOT DETECTED Final   Sapovirus (I, II, IV, and V) NOT DETECTED NOT DETECTED Final  C difficile quick scan w PCR reflex     Status: None   Collection Time: 01/13/16  2:37 PM  Result Value Ref Range Status   C Diff antigen NEGATIVE NEGATIVE Final   C Diff toxin NEGATIVE NEGATIVE Final   C Diff interpretation Negative for C. difficile  Final     Thank you for allowing pharmacy to be a part of this patient's care.  Ramond Dial, Pharm.D Clinical Pharmacist 01/13/2016 6:10 PM

## 2016-01-13 NOTE — Progress Notes (Signed)
[facility] [place] NAME: Luis [name]    MR#:  [id number]  DATE OF BIRTH:  14-May-1966  SUBJECTIVE:  Having explosive diarrhea. And also nausea. Has lots of fatigue feeling short of breath with minimal exertion. I spoke with Dr. 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   CHIEF COMPLAINT:   Chief Complaint  Patient presents with  . Weakness    REVIEW OF SYSTEMS:   Review of Systems  Constitutional: Positive for weight loss and malaise/fatigue. Negative for fever and chills.  HENT: Negative for hearing loss.   Eyes: Negative for blurred vision, double vision and photophobia.  Respiratory: Negative for cough, hemoptysis and shortness of breath.   Cardiovascular: Negative for palpitations, orthopnea and leg swelling.  Gastrointestinal: Positive for nausea and diarrhea. Negative for vomiting and abdominal pain.  Genitourinary: Negative for dysuria and urgency.  Musculoskeletal: Negative for myalgias and neck pain.  Skin: Negative for rash.  Neurological: Positive for weakness. Negative for dizziness, focal weakness, seizures and headaches.  Psychiatric/Behavioral: Negative for memory loss. The patient does not have insomnia.      DRUG ALLERGIES:  No Known Allergies  VITALS:  Blood pressure 113/69, pulse 102, temperature 99.1 F (37.3 C), temperature source Oral, resp. rate 18, height '5\' 7"'$  (1.702 m), weight 55.792 kg (123 lb), SpO2 99 %.  PHYSICAL EXAMINATION:  GENERAL:  50-year-old patient lying in the bed with no acute distress.  EYES: Pupils equal, round, reactive to light and accommodation. No scleral icterus. Extraocular muscles intact.  HEENT: Head atraumatic, normocephalic. Oropharynx and nasopharynx clear.  NECK:  Supple, no jugular venous distention. No thyroid enlargement, no tenderness.  LUNGS: Normal breath sounds bilaterally, no wheezing,  rales,rhonchi or crepitation. No use of accessory muscles of respiration.  CARDIOVASCULAR: S1, S2 normal. No murmurs, rubs, or gallops.  ABDOMEN: Soft, nontender, nondistended. Bowel sounds present. No organomegaly or mass.  EXTREMITIES: No pedal edema, cyanosis, or clubbing.  NEUROLOGIC: Cranial nerves II through XII are intact. Muscle strength 5/5 in all extremities. Sensation intact. Gait not checked.  PSYCHIATRIC: The patient is alert and oriented x 3.  SKIN: No obvious rash, lesion, or ulcer.    LABORATORY PANEL:   CBC  Recent Labs Lab 01/13/16 0727  WBC 25.9*  HGB 7.6*  HCT 24.1*  PLT 148*   ------------------------------------------------------------------------------------------------------------------  Chemistries   Recent Labs Lab 01/11/16 1937 01/12/16 0418  NA 133* 134*  K 3.6 3.8  CL 99* 106  CO2 24 22  GLUCOSE 112* 83  BUN 14 10  CREATININE 0.80 0.70  CALCIUM 8.0* 7.2*  AST 19  --   ALT 28  --   ALKPHOS 235*  --   BILITOT 0.7  --    ------------------------------------------------------------------------------------------------------------------  Cardiac Enzymes  Recent Labs Lab 01/11/16 1937  TROPONINI <0.03   ------------------------------------------------------------------------------------------------------------------  RADIOLOGY:  Dg Chest 1 View  01/12/2016  CLINICAL DATA:  Metastatic lung adenocarcinoma on chemotherapy. Shortness of breath. EXAM: CHEST 1 VIEW COMPARISON:  Chest radiograph from one day prior. FINDINGS: Stable cardiomediastinal silhouette with normal heart size. No pneumothorax. No right pleural effusion. Stable blunting of the left costophrenic angle, favor a small left pleural effusion. No pulmonary edema. Stable consolidation in the apical left upper lobe. Stable patchy left lung base opacity. Stable expansile sclerotic right posterior seventh rib metastasis. Stable sclerotic foci throughout the thoracic spine and medial  left ribs. IMPRESSION: 1. Stable small left pleural  effusion. 2. Stable consolidation in the apical left upper lobe, which could represent posttreatment change and/or tumor. 3. Stable patchy left lung base opacity, either atelectasis or pneumonia. 4. Stable multifocal sclerotic osseous metastases in the medial ribs and thoracic spine. Electronically Signed   By: Ilona Sorrel M.D.   On: 01/12/2016 10:56   Dg Chest 2 View  01/11/2016  CLINICAL DATA:  50 year old male with history of metastatic lung cancer presenting with weakness and possible sepsis EXAM: CHEST  2 VIEW COMPARISON:  Chest CT dated 12/25/2015 FINDINGS: Two views of the chest demonstrate focal area of consolidative change in the left apical region medially. This may represent an area of postradiation, however pneumonia or underlying mass is not excluded. There is a small left pleural effusion, likely malignant. The right lung is clear. There is no pneumothorax. The cardiac silhouette is within normal limits. There are multiple osseous sclerotic lesions. There is expansile sclerotic lesion involving the right posterior seventh rib as well as multiple left ribs compatible with metastatic disease. No acute fracture identified. IMPRESSION: Consolidative change in the left apical region as seen on the prior CT. No new consolidation Small left pleural effusion. Osseous metastatic disease Electronically Signed   By: Anner Crete M.D.   On: 01/11/2016 19:57    EKG:   Orders placed or performed in visit on 03/06/14  . EKG 12-Lead  . EKG 12-Lead  . EKG 12-Lead    ASSESSMENT AND PLAN:  50 year old male patient with metastatic lung cancer with weakness, sepsis of unclear source:  Wait  for urine culture data, blood culture report them if they are negative discontinue antibiotics. Elevated white count secondary to Neulasta that he received after chemotherapy.  #2 metastatic lung cancer with the metastases to bones; patient is getting  chemotherapy. Consult oncology. 3.history of radiation pneumonitis: #4 physical therapy today #5 explosive diarrhea, nausea, generalized weakness, fatigue and shortness of breath likely secondary to [facility] a chemotherapy that he is receiving for lung cancer. I spoke with oncology ,he recommended starting him on prednisone 60 mg daily. And also PPIs. And he will see the patient. Also check stool for C. difficile or diarrhea. . 6.  Non severe malnutrition the context of chronic illness: Congestive continue ensure Enlive. D/w pt and pts nurse All the records are reviewed and case discussed with Care Management/Social Workerr. Management plans discussed with the patient, family and they are in agreement.  CODE STATUS: full  TOTAL TIME TAKING CARE OF THIS PATIENT: 61mnutes.   POSSIBLE D/C IN 1-2 DAYS, DEPENDING ON CLINICAL CONDITION.   KEpifanio LeschesM.D on 01/13/2016 at 8:59 AM  Between 7am to 6pm - Pager - [phone]  After 6pm go to [email or web address] - password [email or web address]  Office  [phone] CC: Primary care physician; PROVIDER NOT IN SYSTEM   Note: This dictation was prepared with Dragon dictation along with smaller phrase technology. Any transcriptional errors that result from this process are unintentional.

## 2016-01-13 NOTE — Consult Note (Signed)
[facility] CONSULT NOTE  Patient Care Team: Provider Not In System as PCP - General  CHIEF COMPLAINTS/PURPOSE OF CONSULTATION: Metastatic lung cancer/status post chemotherapy diarrhea  HISTORY OF PRESENTING ILLNESS:  Luis [name] 50 y.o.  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 He also has been getting radiation palliatively to his back  Patient states that initially was constipated ; he tried stool softeners. Patient  noted to have multiple loose stools over the last 3-4 days; patient had not been compliant with Imodium and Lomotil. He also felt significantly weak all over. Denies any focal deficits. Bladder or bowel incontinence.   Poor appetite. Positive for nausea and vomiting. No chest pain. Positive for shortness of breath with exertion. No worsening cough or hemoptysis. Denies any headaches. He did notice drooping of left eyelid.   ROS: A complete 10 point review of system is done which is negative except mentioned above in history of present illness  MEDICAL HISTORY:  Past Medical History  Diagnosis Date  . HTN (hypertension)   . Brain lesion   . 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 History of radiation therapy   . Adenocarcinoma of lung ([place]) 12/05/2014    SURGICAL HISTORY: Past Surgical History  Procedure Laterality Date  . Laminectomy N/A 03/07/2014    Procedure: T1-T2 Laminectomy with Decompression of Cord and Removal of Cancer  thoracic one/two;  Surgeon: Kristeen Miss, MD;  Location: [facility];  Service: Neurosurgery;  Laterality: N/A;  . Ct guided biopsy  ([facility] hx)  05/28/2014  . Esophagogastroduodenoscopy N/A 10/01/2015    Procedure: ESOPHAGOGASTRODUODENOSCOPY (EGD);  Surgeon: Hulen Luster, MD;   Location: [facility] ENDOSCOPY;  Service: Endoscopy;  Laterality: N/A;    SOCIAL HISTORY: Social History   Social History  . Marital Status: Married    Spouse Name: N/A  . Number of Children: N/A  . Years of Education: N/A   Occupational History  . Not on file.   Social History Main Topics  . Smoking status: Never Smoker   . Smokeless tobacco: Never Used  . Alcohol Use: 0.5 oz/week    1 Standard drinks or equivalent per week     Comment: none for a year  . Drug Use: No  . Sexual Activity: Yes   Other Topics Concern  . Not on file   Social History Narrative    FAMILY HISTORY: Family History  Problem Relation Age of Onset  . Colon cancer Mother   . Heart disease Father   . Hypertension Sister   . Hypertension Brother     ALLERGIES:  has No Known Allergies.  MEDICATIONS:  Current Facility-Administered Medications  Medication Dose Route Frequency Provider Last Rate Last Dose  . acetaminophen (TYLENOL) tablet 650 mg  650 mg Oral Q6H PRN Lance Coon, MD       Or  . acetaminophen (TYLENOL) suppository 650 mg  650 mg Rectal Q6H PRN Lance Coon, MD      . enoxaparin (LOVENOX) injection 40 mg  40 mg Subcutaneous Q24H Lance Coon, MD   40 mg at 01/13/16 0955  . famotidine (PEPCID) IVPB 20 mg premix  20 mg Intravenous Q12H Epifanio Lesches, MD   20 mg at 01/13/16 0955  . feeding supplement (ENSURE ENLIVE) (ENSURE ENLIVE) liquid 237 mL  237 mL Oral TID BM Epifanio Lesches, MD   237 mL at 01/13/16 0955  . morphine 2 MG/ML injection 2 mg  2 mg Intravenous Q4H PRN Lance Coon, MD      . moxifloxacin (VIGAMOX) 0.5 % ophthalmic solution 1 drop  1 drop Left Eye TID Epifanio Lesches, MD   1 drop at 01/13/16 0955  . ondansetron (ZOFRAN) tablet 4 mg  4 mg Oral Q6H PRN Lance Coon, MD       Or  . ondansetron [facility]) injection 4 mg  4 mg Intravenous Q6H PRN Lance Coon, MD      . oxyCODONE (Oxy IR/ROXICODONE) immediate release tablet 10 mg  10 mg Oral Q8H PRN Lance Coon, MD       . piperacillin-tazobactam (ZOSYN) IVPB 3.375 g  3.375 g Intravenous Q8H Vira Blanco, RPH   3.375 g at 01/13/16 0551  . predniSONE (DELTASONE) tablet 10 mg  10 mg Oral Q breakfast Epifanio Lesches, MD   10 mg at 01/13/16 0954  . sodium chloride flush (NS) 0.9 % injection 3 mL  3 mL Intravenous Q12H Lance Coon, MD   3 mL at 01/13/16 0955  . vancomycin (VANCOCIN) IVPB 750 mg/150 ml premix  750 mg Intravenous Q12H Vira Blanco, RPH   750 mg at 01/13/16 0551      .  PHYSICAL EXAMINATION:  Filed Vitals:   01/12/16 2159 01/13/16 0421  BP: 115/64 113/69  Pulse: 106 102  Temp: 99.2 F (37.3 C) 99.1 F (37.3 C)  Resp: 18 18   Filed Weights   01/11/16 1919  Weight: 123 lb (55.792 kg)    GENERAL: Thin built poorly nourished male patient resting in the chair; Alert, no distress and comfortable.  EYES: no pallor or icterus; left eyelid drooping.  OROPHARYNX: no thrush or ulceration. NECK: supple, no masses felt LYMPH:  no palpable lymphadenopathy in the cervical, axillary or inguinal regions LUNGS: decreased breath sounds to auscultation at bases and  No wheeze or crackles HEART/CVS: regular rate & rhythm and no murmurs; No lower extremity edema ABDOMEN: abdomen soft, non-tender and normal bowel sounds Musculoskeletal:no cyanosis of digits and no clubbing  PSYCH: alert & oriented x 3 with fluent speech NEURO: no focal motor/sensory deficits SKIN:  no rashes or significant lesions  LABORATORY DATA:  I have reviewed the data as listed Lab Results  Component Value Date   WBC 25.9* 01/13/2016   HGB 7.6* 01/13/2016   HCT 24.1* 01/13/2016   MCV 64.9* 01/13/2016   PLT 148* 01/13/2016    Recent Labs  02/28/15 1445  06/11/15 1531 06/19/15 1409  12/13/15 1435 12/28/15 1616 01/11/16 1937 01/12/16 0418  NA 135  < >  --   --   < > 133* 129* 133* 134*  K 3.2*  < >  --   --   < > 3.8 4.5 3.6 3.8  CL 104  < >  --   --   < > 102 98* 99* 106  CO2 25  < >  --   --   < > 24  21* 24 22  GLUCOSE 133*  < >  --   --   < > 89 118* 112* 83  BUN 12  < >  --   --   < > 14 14 14 10   CREATININE 1.01  < >  --   --   < > 0.93 0.90 0.80 0.70  CALCIUM 8.3*  < >  --   --   < >  8.8* 8.1* 8.0* 7.2*  GFRNONAA >60  < >  --   --   < > >60 >60 >60 >60  GFRAA >60  < >  --   --   < > >60 >60 >60 >60  PROT 8.0  < > 8.7* 8.1  < > 7.5 8.0 7.0  --   ALBUMIN 3.9  < > 3.3* 3.2*  < > 3.5 3.0* 2.7*  --   AST 46*  < > 183* 29  < > 14* 32 19  --   ALT 64*  < > 225* 69*  < > 10* 43 28  --   ALKPHOS 79  < > 222* 163*  < > 103 252* 235*  --   BILITOT 0.5  < > 0.9 0.3  < > 0.3 0.4 0.7  --   BILIDIR <0.1*  --  0.3 <0.1*  --   --   --   --   --   IBILI NOT CALCULATED  --  0.6 NOT CALCULATED  --   --   --   --   --   < > = values in this interval not displayed.  RADIOGRAPHIC STUDIES: I have personally reviewed the radiological images as listed and agreed with the findings in the report. Dg Chest 1 View  01/12/2016  CLINICAL DATA:  Metastatic lung adenocarcinoma on chemotherapy. Shortness of breath. EXAM: CHEST 1 VIEW COMPARISON:  Chest radiograph from one day prior. FINDINGS: Stable cardiomediastinal silhouette with normal heart size. No pneumothorax. No right pleural effusion. Stable blunting of the left costophrenic angle, favor a small left pleural effusion. No pulmonary edema. Stable consolidation in the apical left upper lobe. Stable patchy left lung base opacity. Stable expansile sclerotic right posterior seventh rib metastasis. Stable sclerotic foci throughout the thoracic spine and medial left ribs. IMPRESSION: 1. Stable small left pleural effusion. 2. Stable consolidation in the apical left upper lobe, which could represent posttreatment change and/or tumor. 3. Stable patchy left lung base opacity, either atelectasis or pneumonia. 4. Stable multifocal sclerotic osseous metastases in the medial ribs and thoracic spine. Electronically Signed   By: Ilona Sorrel M.D.   On: 01/12/2016 10:56   Dg Chest  2 View  01/11/2016  CLINICAL DATA:  50 year old male with history of metastatic lung cancer presenting with weakness and possible sepsis EXAM: CHEST  2 VIEW COMPARISON:  Chest CT dated 12/25/2015 FINDINGS: Two views of the chest demonstrate focal area of consolidative change in the left apical region medially. This may represent an area of postradiation, however pneumonia or underlying mass is not excluded. There is a small left pleural effusion, likely malignant. The right lung is clear. There is no pneumothorax. The cardiac silhouette is within normal limits. There are multiple osseous sclerotic lesions. There is expansile sclerotic lesion involving the right posterior seventh rib as well as multiple left ribs compatible with metastatic disease. No acute fracture identified. IMPRESSION: Consolidative change in the left apical region as seen on the prior CT. No new consolidation Small left pleural effusion. Osseous metastatic disease Electronically Signed   By: Anner Crete M.D.   On: 01/11/2016 19:57   Ct Chest W Contrast  12/25/2015  CLINICAL DATA:  50 year old male with history of metastatic lung cancer originally diagnosed in May 2016, status post radiation therapy. Taking prednisone for history of radiation pneumonitis. EXAM: CT CHEST, ABDOMEN, AND PELVIS WITH CONTRAST TECHNIQUE: Multidetector CT imaging of the chest, abdomen and pelvis was performed following the standard  protocol during bolus administration of intravenous contrast. CONTRAST:  58m ISOVUE-300 IOPAMIDOL (ISOVUE-300) INJECTION 61% COMPARISON:  Chest CT 10/31/2015. Chest CT 08/20/2015. CT of the chest, abdomen and pelvis 05/16/2015. Multiple other priors. FINDINGS: CT CHEST FINDINGS Mediastinum/Lymph Nodes: Heart size is normal. There is no significant pericardial fluid, thickening or pericardial calcification. There is atherosclerosis of the thoracic aorta, the great vessels of the mediastinum and the coronary arteries, including  calcified atherosclerotic plaque in the left anterior descending coronary arteries. Interval development of a 1 cm short axis prevascular lymph node. No other mediastinal or hilar lymphadenopathy noted. Esophagus is unremarkable in appearance. No axillary lymphadenopathy. Lungs/Pleura: In the medial aspect of the left lung extending from the apex inferiorly involving predominantly the medial aspect of the left upper lobe, but also involving the superior segment of the left lower lobe, there is a new mass like opacity with internal air bronchograms, favored to reflect an area of evolving postradiation change. However, surrounding this opacity there are innumerable areas of septal thickening and nodularity, concerning for developing lymphangitic spread of disease. There is also some ground-glass attenuation in the posterior aspect of the left upper lobe. Multiple other new pulmonary nodules are also noted in the right lung, particularly in the right upper lobe, generally measuring 1 cm or less in size. New small left pleural effusion lying dependently. No right pleural effusion. Musculoskeletal/Soft Tissues: Multiple predominantly sclerotic lesions are again noted throughout the axial and appendicular skeleton, compatible with widespread metastatic disease to the bone, slightly progressed compared to the prior study. This is most notable for multiple pathologic compression fractures which are most severe at T2 and T5 where there is approximately 80% loss of central vertebral body height, similar to the prior study. CT ABDOMEN AND PELVIS FINDINGS Hepatobiliary: The perfusion pattern in the liver is very heterogeneous, with multiple low-attenuation areas that likely reflect developing areas of hepatic steatosis. In addition, there are several new hypovascular lesions, the largest of which is in segment 3 of the liver (image 59 of series 2) measuring 3.0 x 2.5 cm, concerning for new hepatic metastasis. The other large  lesion is in the central aspect of segment 5 (image 61 of series 2) measuring 2.1 cm in diameter. No intra or extrahepatic biliary ductal dilatation. Gallbladder is normal in appearance. Pancreas: No pancreatic mass. No pancreatic ductal dilatation. No pancreatic or peripancreatic fluid or inflammatory changes. Spleen: Unremarkable. Adrenals/Urinary Tract: New 2.6 x 1.5 cm left adrenal nodule is presumably metastatic. Likewise, there is a new 1.6 x 1.8 cm right adrenal nodule which also likely represents a metastatic lesion. Right kidney is normal in appearance. 4.2 cm low-attenuation lesion in the upper pole of the left kidney is compatible with a simple cyst. Exophytic 8 mm lesion in the lateral aspect of the lower pole of the left kidney (image 68 of series 2) is new compared to the prior examination and measures 50 HU and 59 HU on postcontrast and delayed images respectively. No hydroureteronephrosis. Urinary bladder is normal in appearance. Stomach/Bowel: Normal appearance of the stomach. No pathologic dilatation of small bowel or colon. Normal appendix. Vascular/Lymphatic: Atherosclerosis throughout the abdominal and pelvic vasculature, without evidence of aneurysm or dissection. No lymphadenopathy noted in the abdomen or pelvis. Reproductive: Prostate gland and seminal vesicles are unremarkable in appearance. Other: No significant volume of ascites.  No pneumoperitoneum. Musculoskeletal: Numerous predominantly sclerotic lesions are again noted throughout the visualized axial and appendicular skeleton, greater in number and size than the prior study. Old  pathologic compression fracture at L2 with approximately 60% loss of anterior vertebral body height is unchanged. A specific example of an enlarging lesion is a 3.7 x 1.6 cm sclerotic lesion in the left side of the L4 vertebral body which previously measured only 1.1 x 0.8 cm on prior study 05/16/2015. IMPRESSION: 1. Today's study demonstrates progression of  disease with what appears to be widespread metastatic disease throughout the chest, abdomen and pelvis. Specifically, although some of the changes in the medial aspect of the left lung can be accounted for by evolving postradiation changes, there are innumerable new nodules and areas of septal thickening throughout the lungs bilaterally which are concerning for developing lymphangitic spread of disease, there is a new left pleural effusion which may be malignant, new anterior mediastinal lymphadenopathy, worsening widespread osseous metastasis, as well as new hepatic and bilateral adrenal metastasis. 2. 8 mm indeterminate lesion in the lower pole of the left kidney new is new compared to prior examinations, but favored to represent a small proteinaceous/hemorrhagic cyst the possibility of a small renal neoplasm or metastatic lesion is not excluded, and attention on followup studies is recommended. 3. Additional incidental findings, as above. Electronically Signed   By: Vinnie Langton M.D.   On: 12/25/2015 15:42   Ct Abdomen Pelvis W Contrast  12/25/2015  CLINICAL DATA:  50 year old male with history of metastatic lung cancer originally diagnosed in May 2016, status post radiation therapy. Taking prednisone for history of radiation pneumonitis. EXAM: CT CHEST, ABDOMEN, AND PELVIS WITH CONTRAST TECHNIQUE: Multidetector CT imaging of the chest, abdomen and pelvis was performed following the standard protocol during bolus administration of intravenous contrast. CONTRAST:  71m ISOVUE-300 IOPAMIDOL (ISOVUE-300) INJECTION 61% COMPARISON:  Chest CT 10/31/2015. Chest CT 08/20/2015. CT of the chest, abdomen and pelvis 05/16/2015. Multiple other priors. FINDINGS: CT CHEST FINDINGS Mediastinum/Lymph Nodes: Heart size is normal. There is no significant pericardial fluid, thickening or pericardial calcification. There is atherosclerosis of the thoracic aorta, the great vessels of the mediastinum and the coronary arteries,  including calcified atherosclerotic plaque in the left anterior descending coronary arteries. Interval development of a 1 cm short axis prevascular lymph node. No other mediastinal or hilar lymphadenopathy noted. Esophagus is unremarkable in appearance. No axillary lymphadenopathy. Lungs/Pleura: In the medial aspect of the left lung extending from the apex inferiorly involving predominantly the medial aspect of the left upper lobe, but also involving the superior segment of the left lower lobe, there is a new mass like opacity with internal air bronchograms, favored to reflect an area of evolving postradiation change. However, surrounding this opacity there are innumerable areas of septal thickening and nodularity, concerning for developing lymphangitic spread of disease. There is also some ground-glass attenuation in the posterior aspect of the left upper lobe. Multiple other new pulmonary nodules are also noted in the right lung, particularly in the right upper lobe, generally measuring 1 cm or less in size. New small left pleural effusion lying dependently. No right pleural effusion. Musculoskeletal/Soft Tissues: Multiple predominantly sclerotic lesions are again noted throughout the axial and appendicular skeleton, compatible with widespread metastatic disease to the bone, slightly progressed compared to the prior study. This is most notable for multiple pathologic compression fractures which are most severe at T2 and T5 where there is approximately 80% loss of central vertebral body height, similar to the prior study. CT ABDOMEN AND PELVIS FINDINGS Hepatobiliary: The perfusion pattern in the liver is very heterogeneous, with multiple low-attenuation areas that likely reflect developing areas of  hepatic steatosis. In addition, there are several new hypovascular lesions, the largest of which is in segment 3 of the liver (image 59 of series 2) measuring 3.0 x 2.5 cm, concerning for new hepatic metastasis. The  other large lesion is in the central aspect of segment 5 (image 61 of series 2) measuring 2.1 cm in diameter. No intra or extrahepatic biliary ductal dilatation. Gallbladder is normal in appearance. Pancreas: No pancreatic mass. No pancreatic ductal dilatation. No pancreatic or peripancreatic fluid or inflammatory changes. Spleen: Unremarkable. Adrenals/Urinary Tract: New 2.6 x 1.5 cm left adrenal nodule is presumably metastatic. Likewise, there is a new 1.6 x 1.8 cm right adrenal nodule which also likely represents a metastatic lesion. Right kidney is normal in appearance. 4.2 cm low-attenuation lesion in the upper pole of the left kidney is compatible with a simple cyst. Exophytic 8 mm lesion in the lateral aspect of the lower pole of the left kidney (image 68 of series 2) is new compared to the prior examination and measures 50 HU and 59 HU on postcontrast and delayed images respectively. No hydroureteronephrosis. Urinary bladder is normal in appearance. Stomach/Bowel: Normal appearance of the stomach. No pathologic dilatation of small bowel or colon. Normal appendix. Vascular/Lymphatic: Atherosclerosis throughout the abdominal and pelvic vasculature, without evidence of aneurysm or dissection. No lymphadenopathy noted in the abdomen or pelvis. Reproductive: Prostate gland and seminal vesicles are unremarkable in appearance. Other: No significant volume of ascites.  No pneumoperitoneum. Musculoskeletal: Numerous predominantly sclerotic lesions are again noted throughout the visualized axial and appendicular skeleton, greater in number and size than the prior study. Old pathologic compression fracture at L2 with approximately 60% loss of anterior vertebral body height is unchanged. A specific example of an enlarging lesion is a 3.7 x 1.6 cm sclerotic lesion in the left side of the L4 vertebral body which previously measured only 1.1 x 0.8 cm on prior study 05/16/2015. IMPRESSION: 1. Today's study demonstrates  progression of disease with what appears to be widespread metastatic disease throughout the chest, abdomen and pelvis. Specifically, although some of the changes in the medial aspect of the left lung can be accounted for by evolving postradiation changes, there are innumerable new nodules and areas of septal thickening throughout the lungs bilaterally which are concerning for developing lymphangitic spread of disease, there is a new left pleural effusion which may be malignant, new anterior mediastinal lymphadenopathy, worsening widespread osseous metastasis, as well as new hepatic and bilateral adrenal metastasis. 2. 8 mm indeterminate lesion in the lower pole of the left kidney new is new compared to prior examinations, but favored to represent a small proteinaceous/hemorrhagic cyst the possibility of a small renal neoplasm or metastatic lesion is not excluded, and attention on followup studies is recommended. 3. Additional incidental findings, as above. Electronically Signed   By: Vinnie Langton M.D.   On: 12/25/2015 15:42    ASSESSMENT & PLAN:   # 50 year old male patient with history of EGFR mutated adenocarcinoma the lung/stage IV- currently on chemotherapy with Alimta-carboplatin-Keytruda status post cycle #1 [June 20th] currently admitted to the hospital for generalized weakness/diarrhea/shortness of breath.  # Diarrhea- multiple loose stools; question related to Keytruda [immune-related diarrhea usually starts 6 weeks; incidences less than 5%-grade 3 or 4] versus others. Check stool cultures; C. Difficile. Recommend starting patient on steroids. Also recommend Lomotil /Imodium. Also recommend IV fluids.  # Adenocarcinoma of the lung stage IV-on  palliative chemotherapy with Alimta carboplatin Keytruda s/p # 1 cycle.   # Leukocytosis-  likely paraneoplastic/underlying lung cancer. Patient has not received growth factor support- like neulasta/neupogen. Agree with ruling out infectious causes;  antibiotics. Await blood cultures.   # Bony metastases- currently on palliative radiation.   # Hypocalcemia likely from [place].  # History of GI bleed/ GI prophylaxis with Pepcid twice a day.   All questions were answered. The patient knows to call the clinic with any problems, questions or concerns.  Thank you Dr. for allowing me to participate in the care of your pleasant patient. Please do not hesitate to contact me with questions or concerns in the interim. Discussed with Dr.Konidena.     Cammie Sickle, MD 01/13/2016 10:29 AM

## 2016-01-14 DIAGNOSIS — D649 Anemia, unspecified: Secondary | ICD-10-CM

## 2016-01-14 LAB — VANCOMYCIN, TROUGH: VANCOMYCIN TR: 10 ug/mL — AB (ref 15–20)

## 2016-01-14 LAB — PREPARE RBC (CROSSMATCH)

## 2016-01-14 MED ORDER — SODIUM CHLORIDE 0.9% FLUSH: 10.0000 mL | INTRAVENOUS | Status: DC | PRN

## 2016-01-14 MED ORDER — DIPHENHYDRAMINE HCL 25 MG PO CAPS
25.0000 mg | ORAL_CAPSULE | Freq: Once | ORAL | Status: AC
  Administered 2016-01-14: 25 mg via ORAL
  Filled 2016-01-14: qty 1

## 2016-01-14 MED ORDER — ACETAMINOPHEN 325 MG PO TABS
650.0000 mg | ORAL_TABLET | Freq: Once | ORAL | Status: AC
  Administered 2016-01-14: 650 mg via ORAL
  Filled 2016-01-14: qty 2

## 2016-01-14 MED ORDER — HEPARIN SOD (PORK) LOCK FLUSH 100 UNIT/ML IV SOLN: 500.0000 [IU] | Freq: Every day | INTRAVENOUS | Status: DC | PRN

## 2016-01-14 MED ORDER — VANCOMYCIN HCL IN DEXTROSE 1-5 GM/200ML-% IV SOLN
1000.0000 mg | Freq: Three times a day (TID) | INTRAVENOUS | Status: DC
  Administered 2016-01-14 – 2016-01-15 (×2): 1000 mg via INTRAVENOUS
  Filled 2016-01-14 (×4): qty 200

## 2016-01-14 MED ORDER — SODIUM CHLORIDE 0.9 % IV SOLN
250.0000 mL | Freq: Once | INTRAVENOUS | Status: AC
  Administered 2016-01-14: 250 mL via INTRAVENOUS

## 2016-01-14 MED ORDER — HEPARIN SOD (PORK) LOCK FLUSH 100 UNIT/ML IV SOLN: 250.0000 [IU] | INTRAVENOUS | Status: DC | PRN

## 2016-01-14 MED ORDER — FAMOTIDINE 20 MG PO TABS
20.0000 mg | ORAL_TABLET | Freq: Two times a day (BID) | ORAL | Status: DC
  Administered 2016-01-14 – 2016-01-16 (×4): 20 mg via ORAL
  Filled 2016-01-14 (×4): qty 1

## 2016-01-14 MED ORDER — SODIUM CHLORIDE 0.9% FLUSH: 3.0000 mL | INTRAVENOUS | Status: DC | PRN

## 2016-01-14 MED ORDER — OXYCODONE HCL 5 MG PO TABS: 10.0000 mg | ORAL_TABLET | Freq: Three times a day (TID) | ORAL | Status: DC | PRN

## 2016-01-14 NOTE — Progress Notes (Signed)
[facility] [place] NAME: Luis [name]    MR#:  [id number]  DATE OF BIRTH:  [birth year]  SUBJECTIVE:  H e says he is feeling better, diarrhea is less. Appetite is coming back. Walked around  nurses station yesterday   CHIEF COMPLAINT:   Chief Complaint  Patient presents with  . Weakness    REVIEW OF SYSTEMS:   Review of Systems  Constitutional: Positive for weight loss and malaise/fatigue. Negative for fever and chills.  HENT: Negative for hearing loss.   Eyes: Negative for blurred vision, double vision and photophobia.  Respiratory: Negative for cough, hemoptysis and shortness of breath.   Cardiovascular: Negative for palpitations, orthopnea and leg swelling.  Gastrointestinal: Negative for nausea, vomiting, abdominal pain and diarrhea.  Genitourinary: Negative for dysuria and urgency.  Musculoskeletal: Negative for myalgias and neck pain.  Skin: Negative for rash.  Neurological: Positive for weakness. Negative for dizziness, focal weakness, seizures and headaches.  Psychiatric/Behavioral: Negative for memory loss. The patient does not have insomnia.      DRUG ALLERGIES:  No Known Allergies  VITALS:  Blood pressure 118/72, pulse 99, temperature 98.4 F (36.9 C), temperature source Oral, resp. rate 18, height '5\' 7"'$  (1.702 m), weight 55.792 kg (123 lb), SpO2 99 %.  PHYSICAL EXAMINATION:  GENERAL:  50 y.o.-year-old patient lying in the bed with no acute distress.  EYES: Pupils equal, round, reactive to light and accommodation. No scleral icterus. Extraocular muscles intact.  HEENT: Head atraumatic, normocephalic. Oropharynx and nasopharynx clear.  NECK:  Supple, no jugular venous distention. No thyroid enlargement, no tenderness.  LUNGS: Normal breath sounds bilaterally, no wheezing, rales,rhonchi or crepitation. No use of accessory muscles of respiration.  CARDIOVASCULAR: S1, S2 normal. No murmurs, rubs, or gallops.  ABDOMEN:  Soft, nontender, nondistended. Bowel sounds present. No organomegaly or mass.  EXTREMITIES: No pedal edema, cyanosis, or clubbing.  NEUROLOGIC: Cranial nerves II through XII are intact. Muscle strength 5/5 in all extremities. Sensation intact. Gait not checked.  PSYCHIATRIC: The patient is alert and oriented x 3.  SKIN: No obvious rash, lesion, or ulcer.    LABORATORY PANEL:   CBC  Recent Labs Lab 01/13/16 0727  WBC 25.9*  HGB 7.6*  HCT 24.1*  PLT 148*   ------------------------------------------------------------------------------------------------------------------  Chemistries   Recent Labs Lab 01/11/16 1937 01/12/16 0418  NA 133* 134*  K 3.6 3.8  CL 99* 106  CO2 24 22  GLUCOSE 112* 83  BUN 14 10  CREATININE 0.80 0.70  CALCIUM 8.0* 7.2*  AST 19  --   ALT 28  --   ALKPHOS 235*  --   BILITOT 0.7  --    ------------------------------------------------------------------------------------------------------------------  Cardiac Enzymes  Recent Labs Lab 01/11/16 1937  TROPONINI <0.03   ------------------------------------------------------------------------------------------------------------------  RADIOLOGY:  No results found.  EKG:   Orders placed or performed in visit on 03/06/14  . EKG 12-Lead  . EKG 12-Lead  . EKG 12-Lead    ASSESSMENT AND PLAN:  50 year old male patient with metastatic lung cancer with weakness, sepsis of unclear source'Blood cultures, urine cultures did not show any growth so far. Oncology does not think it's from Neulasta. And any empiric antibiotics today also, today will be third day. Genital discharge. 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 Consulted oncology.  3.history of radiation pneumonitis: #4 acute on chronic anemia, chemotherapy-induced anemia with symptoms of fatigue and shortness of breath: Transfusion of 2 units of  packed RBC as per  Oncology recommendation,  #5  explosive diarrhea, nausea, generalized weakness, fatigue and shortness of breath likely secondary to [facility] a chemotherapy that he is receiving for lung cancer. Improved after starting the prednisone. Patient can take Imodium. C. difficile is negative.  . 6.  Non severe malnutrition the context of chronic illness:  continue ensure Enlive. Patient says his appetite is coming back. D/w pt and pts nurse  Likely discharge tomorrow after blood transfusion today and see how he does today. Same is explained to the patient.   All the records are reviewed and case discussed with Care Management/Social Workerr. Management plans discussed with the patient, family and they are in agreement.  CODE STATUS: full  TOTAL TIME TAKING CARE OF THIS PATIENT: 32mnutes.   POSSIBLE D/C IN 1-2 DAYS, DEPENDING ON CLINICAL CONDITION.   KEpifanio LeschesM.D on 01/14/2016 at 11:08 AM  Between 7am to 6pm - Pager - [phone]  After 6pm go to [email or web address] - password [email or web address]  Office  [phone] CC: Primary care physician; PROVIDER NOT IN SYSTEM   Note: This dictation was prepared with Dragon dictation along with smaller phrase technology. Any transcriptional errors that result from this process are unintentional.

## 2016-01-14 NOTE — Progress Notes (Signed)
Luis [name]   DOB:1966-04-04   [id number]    Subjective: Patient is resting in the bed. He had 3 loose stools yesterday. One loose stool this morning. Denies abdominal pain. Denies any blood in stools. No nausea no vomiting. No chest pain. Complains of shortness of breath on exertion; no coughing.  Objective:  Filed Vitals:   01/14/16 1615 01/14/16 1642  BP: 112/66 110/69  Pulse: 102 99  Temp: 98 F (36.7 C) 98.1 F (36.7 C)  Resp: 18 20     Intake/Output Summary (Last 24 hours) at 01/14/16 1852 Last data filed at 01/14/16 1700  Gross per 24 hour  Intake    830 ml  Output    800 ml  Net     30 ml    GENERAL: Thin built poorly nourished male patient resting in the chair; Alert, no distress and comfortable.  EYES: no pallor or icterus; left eyelid drooping.  OROPHARYNX: no thrush or ulceration. NECK: supple, no masses felt LYMPH: no palpable lymphadenopathy in the cervical, axillary or inguinal regions LUNGS: decreased breath sounds to auscultation at bases and No wheeze or crackles HEART/CVS: regular rate & rhythm and no murmurs; No lower extremity edema ABDOMEN: abdomen soft, non-tender and normal bowel sounds Musculoskeletal:no cyanosis of digits and no clubbing  PSYCH: alert & oriented x 3 with fluent speech NEURO: no focal motor/sensory deficits SKIN: no rashes or significant lesions   Labs:  Lab Results  Component Value Date   WBC 25.9* 01/13/2016   HGB 7.6* 01/13/2016   HCT 24.1* 01/13/2016   MCV 64.9* 01/13/2016   PLT 148* 01/13/2016   NEUTROABS 24.2* 01/11/2016    Lab Results  Component Value Date   NA 134* 01/12/2016   K 3.8 01/12/2016   CL 106 01/12/2016   CO2 22 01/12/2016    Studies:  No results found.  Assessment & Plan:   50 year old male patient with history of EGFR mutated adenocarcinoma the lung/stage IV- currently on chemotherapy with Alimta-carboplatin-Keytruda status post cycle #1 [June 20th] currently admitted to the hospital for  generalized weakness/diarrhea/shortness of breath.  # Diarrhea- multiple loose stools;- Question chemotherapy unlikely Keytruda. C. Difficile/stool cultures negative. Continue prednisone 60 mg for now; start tapering down more  # Adenocarcinoma of the lung stage IV-on palliative chemotherapy with Alimta carboplatin Keytruda s/p # 1 cycle.   # Leukocytosis- likely paraneoplastic/underlying lung cancer. Patient has not received growth factor support- like neulasta/neupogen. Agree with ruling out infectious causes; antibiotics  # Anemia- hemoglobin 7 multifactorial- chemotherapy and malignancy. Recommend 2 units of PRBC transfusion   Cammie Sickle, MD 01/14/2016  6:52 PM

## 2016-01-14 NOTE — Progress Notes (Signed)
Pharmacy Antibiotic Note  Luis [name] is a 50 y.o. male admitted on 01/11/2016 with sepsis.  Pharmacy has been consulted for Vancomycin and Zosyn dosing.  Ke=0.075 T1/2=9.2hr Vd=39L  Plan: Vancomycin '750mg'$  IV every 12 hours.  Goal trough 15-20 mcg/mL. Zosyn 3.375g IV q8h (4 hour infusion). Will check a trough level prior to 5th dose.   7/3:  Vanc trough @ 2120 = 10 mcg/mL.   Trough was drawn about 4 hrs late but still seems lower than would be expected if pt was therapeutic.   Will increase to Vanc 1 gm IV Q8H to start 7/4 @ 00:00. Will draw next trough on 7/4 @ 15:30.   Height: '5\' 7"'$  (170.2 cm) Weight: 123 lb (55.792 kg) IBW/kg (Calculated) : 66.1  Temp (24hrs), Avg:98.1 F (36.7 C), Min:97.8 F (36.6 C), Max:98.4 F (36.9 C)   Recent Labs Lab 01/11/16 1937 01/11/16 2255 01/12/16 0418 01/13/16 0727 01/13/16 1622 01/14/16 2120  WBC 28.9*  --  25.7* 25.9*  --   --   CREATININE 0.80  --  0.70  --   --   --   LATICACIDVEN 2.1* 1.3  --   --   --   --   VANCOTROUGH  --   --   --   --  8* 10*    Estimated Creatinine Clearance: 85.3 mL/min (by C-G formula based on Cr of 0.7).    No Known Allergies  Antimicrobials this admission: Vancomycin 6/30 >>  Zosyn 6/30 >>   Dose adjustments this admission:  Microbiology results:  Thank you for allowing pharmacy to be a part of this patient's care.  Paulina Fusi, PharmD, BCPS 01/14/2016 10:53 PM

## 2016-01-14 NOTE — Progress Notes (Signed)
Physical Therapy Treatment Patient Details Name: Luis [name] MRN: [id number] DOB: April 14, 1966 Today's Date: 01/14/2016    History of Present Illness Pt is a 50 y/o male with PMH og metastatic cancer (lung, spine, brain) recently started on chemotherapy with history of radiation therapy. He presents with fever, chills, and weakness at home.     PT Comments    Pt in bed resting, agrees to session.  Pt was able to get in/out of bed with rail and HOB raised without assist.  He ambulated 200' with walker and min guard - slight imbalances but able to recover without assist.  HR was noted to be increased to 124 during gait.  Some SOB/fatigue noted.  Pt returned to room and HR decreased with rest.  Declined exercises.    Follow Up Recommendations  Home health PT     Equipment Recommendations  Rolling walker with 5" wheels    Recommendations for Other Services       Precautions / Restrictions Precautions Precautions: Fall Restrictions Weight Bearing Restrictions: No    Mobility  Bed Mobility Overal bed mobility: Modified Independent Bed Mobility: Supine to Sit;Sit to Supine     Supine to sit: Modified independent (Device/Increase time) (rail and HOB raised) Sit to supine: Modified independent (Device/Increase time) (rail and HOB raised)      Transfers Overall transfer level: Needs assistance Equipment used: None Transfers: Sit to/from Stand Sit to Stand: Min guard         General transfer comment: no lob  Ambulation/Gait Ambulation/Gait assistance: Min guard Ambulation Distance (Feet): 200 Feet Assistive device: Rolling walker (2 wheeled) Gait Pattern/deviations: Step-through pattern;Narrow base of support   Gait velocity interpretation: <1.8 ft/sec, indicative of risk for recurrent falls     Stairs            Wheelchair Mobility    Modified Rankin (Stroke Patients Only)       Balance Overall balance assessment: Needs assistance Sitting-balance support:  No upper extremity supported Sitting balance-Leahy Scale: Fair     Standing balance support: Bilateral upper extremity supported Standing balance-Leahy Scale: Fair                      Cognition Arousal/Alertness: Awake/alert Behavior During Therapy: WFL for tasks assessed/performed Overall Cognitive Status: Within Functional Limits for tasks assessed                      Exercises      General Comments        Pertinent Vitals/Pain Pain Assessment: 0-10 Pain Score: 2     Home Living                      Prior Function            PT Goals (current goals can now be found in the care plan section) Progress towards PT goals: Progressing toward goals    Frequency  Min 2X/week    PT Plan Current plan remains appropriate    Co-evaluation             End of Session Equipment Utilized During Treatment: Gait belt Activity Tolerance: Patient tolerated treatment well;Other (comment) (increaed heartrate 124 after gait) Patient left: in bed;with call bell/phone within reach;with bed alarm set     Time: 5093-2671 PT Time Calculation (min) (ACUTE ONLY): 14 min  Charges:  $Gait Training: 8-22 mins  G Codes:      Chesley Noon, PTA 01/14/2016, 10:22 AM

## 2016-01-14 NOTE — Progress Notes (Addendum)
Vancomycin trough cannot be drawn at this time due to blood infusing. Once transfusion is complete, lab will draw vanc trough and pharmacy will notify about next vancomycin dose according to lab result.

## 2016-01-14 NOTE — Care Management Note (Signed)
Case Management Note  Patient Details  Name: Luis [name] MRN: [id number] Date of Birth: 04-10-1966  Subjective/Objective:        Received 2 units PRBCs today. Ambulating around nurses station with walker. Still receiving IV Vancomycin and Zosyn.              Action/Plan:   Expected Discharge Date:                  Expected Discharge Plan:     In-House Referral:     Discharge planning Services     Post Acute Care Choice:    Choice offered to:     DME Arranged:    DME Agency:     HH Arranged:    HH Agency:     Status of Service:     If discussed at [employer] of Stay Meetings, dates discussed:    Additional Comments:  Laikyn Gewirtz A, RN 01/14/2016, 2:23 PM

## 2016-01-15 LAB — VANCOMYCIN, TROUGH: VANCOMYCIN TR: 22 ug/mL — AB (ref 15–20)

## 2016-01-15 LAB — TYPE AND SCREEN
ABO/RH(D): O POS
ANTIBODY SCREEN: NEGATIVE
UNIT DIVISION: 0
Unit division: 0

## 2016-01-15 LAB — CBC
HCT: 32.6 % — ABNORMAL LOW (ref 40.0–52.0)
Hemoglobin: 10.7 g/dL — ABNORMAL LOW (ref 13.0–18.0)
MCH: 23.2 pg — AB (ref 26.0–34.0)
MCHC: 33 g/dL (ref 32.0–36.0)
MCV: 70.5 fL — ABNORMAL LOW (ref 80.0–100.0)
PLATELETS: 229 10*3/uL (ref 150–440)
RBC: 4.62 MIL/uL (ref 4.40–5.90)
RDW: 27.8 % — ABNORMAL HIGH (ref 11.5–14.5)
WBC: 27.5 10*3/uL — AB (ref 3.8–10.6)

## 2016-01-15 MED ORDER — LOPERAMIDE HCL 2 MG PO CAPS
4.0000 mg | ORAL_CAPSULE | Freq: Once | ORAL | Status: AC
  Administered 2016-01-15: 16:00:00 4 mg via ORAL

## 2016-01-15 MED ORDER — VANCOMYCIN HCL 10 G IV SOLR
1250.0000 mg | Freq: Two times a day (BID) | INTRAVENOUS | Status: DC
  Administered 2016-01-16: 1250 mg via INTRAVENOUS
  Filled 2016-01-15 (×4): qty 1250

## 2016-01-15 MED ORDER — VANCOMYCIN HCL 10 G IV SOLR: 1500.0000 mg | Freq: Two times a day (BID) | INTRAVENOUS | Status: DC

## 2016-01-15 NOTE — Progress Notes (Signed)
Pharmacy Antibiotic Note  Luis [name] is a 50 y.o. male admitted on 01/11/2016 with sepsis.  Pharmacy has been consulted for Vancomycin and piperacillin/tazobactam dosing.  This is day #5 of antibiotics.  Plan: Continue piperacillin/tazobactam 3.375 g IV q8h EI  Vancomycin trough @ 1512 = 22 mcg/mL (slightly supratherapeutic).  Change vancomycin dose to 1250 mg IV q12h Goal vancomycin trough = 15-20 mcg/mL Vancomycin trough ordered for 7/7 @ 0730   Height: '5\' 7"'$  (170.2 cm) Weight: 123 lb (55.792 kg) IBW/kg (Calculated) : 66.1  Temp (24hrs), Avg:98.2 F (36.8 C), Min:98 F (36.7 C), Max:98.4 F (36.9 C)   Recent Labs Lab 01/11/16 1937 01/11/16 2255 01/12/16 0418 01/13/16 0727  01/14/16 2120 01/15/16 0411 01/15/16 1512  WBC 28.9*  --  25.7* 25.9*  --   --  27.5*  --   CREATININE 0.80  --  0.70  --   --   --   --   --   LATICACIDVEN 2.1* 1.3  --   --   --   --   --   --   VANCOTROUGH  --   --   --   --   < > 10*  --  22*  < > = values in this interval not displayed.  Estimated Creatinine Clearance: 85.3 mL/min (by C-G formula based on Cr of 0.7).    No Known Allergies  Antimicrobials this admission: Vancomycin 6/30 >>  Zosyn 6/30 >>   Dose adjustments this admission: 7/4 vancomycin 1000 mg IV q8h changed to 1250 mg IV q12h  Microbiology results: 6/30: BCx - no growth 4 days GI panel: not detected UCx: negative  Thank you for allowing pharmacy to be a part of this patient's care.  Lenis Noon, PharmD 01/15/2016 4:14 PM

## 2016-01-15 NOTE — Progress Notes (Signed)
[facility] at [place] NAME: Davison Ohms    MR#:  [id number]  DATE OF BIRTH:  1966/07/13  Seen at bedside. having explosive diarrhea. No abdominal pain.   CHIEF COMPLAINT:   Chief Complaint  Patient presents with  . Weakness    REVIEW OF SYSTEMS:   Review of Systems  Constitutional: Positive for weight loss and malaise/fatigue. Negative for fever and chills.  HENT: Negative for hearing loss.   Eyes: Negative for blurred vision, double vision and photophobia.  Respiratory: Negative for cough, hemoptysis and shortness of breath.   Cardiovascular: Negative for palpitations, orthopnea and leg swelling.  Gastrointestinal: Positive for diarrhea. Negative for nausea, vomiting and abdominal pain.  Genitourinary: Negative for dysuria and urgency.  Musculoskeletal: Negative for myalgias and neck pain.  Skin: Negative for rash.  Neurological: Positive for weakness. Negative for dizziness, focal weakness, seizures and headaches.  Psychiatric/Behavioral: Negative for memory loss. The patient does not have insomnia.      DRUG ALLERGIES:  No Known Allergies  VITALS:  Blood pressure 103/69, pulse 81, temperature 98.4 F (36.9 C), temperature source Oral, resp. rate 20, height '5\' 7"'$  (1.702 m), weight 55.792 kg (123 lb), SpO2 97 %.  PHYSICAL EXAMINATION:  GENERAL:  50 y.o.-year-old patient lying in the bed with no acute distress.  EYES: Pupils equal, round, reactive to light and accommodation. No scleral icterus. Extraocular muscles intact.  HEENT: Head atraumatic, normocephalic. Oropharynx and nasopharynx clear.  NECK:  Supple, no jugular venous distention. No thyroid enlargement, no tenderness.  LUNGS: Normal breath sounds bilaterally, no wheezing, rales,rhonchi or crepitation. No use of accessory muscles of respiration.  CARDIOVASCULAR: S1, S2 normal. No murmurs, rubs, or gallops.  ABDOMEN: Soft, nontender, nondistended. Bowel sounds present.  No organomegaly or mass.  EXTREMITIES: No pedal edema, cyanosis, or clubbing.  NEUROLOGIC: Cranial nerves II through XII are intact. Muscle strength 5/5 in all extremities. Sensation intact. Gait not checked.  PSYCHIATRIC: The patient is alert and oriented x 3.  SKIN: No obvious rash, lesion, or ulcer.    LABORATORY PANEL:   CBC  Recent Labs Lab 01/15/16 0411  WBC 27.5*  HGB 10.7*  HCT 32.6*  PLT 229   ------------------------------------------------------------------------------------------------------------------  Chemistries   Recent Labs Lab 01/11/16 1937 01/12/16 0418  NA 133* 134*  K 3.6 3.8  CL 99* 106  CO2 24 22  GLUCOSE 112* 83  BUN 14 10  CREATININE 0.80 0.70  CALCIUM 8.0* 7.2*  AST 19  --   ALT 28  --   ALKPHOS 235*  --   BILITOT 0.7  --    ------------------------------------------------------------------------------------------------------------------  Cardiac Enzymes  Recent Labs Lab 01/11/16 1937  TROPONINI <0.03   ------------------------------------------------------------------------------------------------------------------  RADIOLOGY:  No results found.  EKG:   Orders placed or performed in visit on 03/06/14  . EKG 12-Lead  . EKG 12-Lead  . EKG 12-Lead    ASSESSMENT AND PLAN:  50 year old male patient with metastatic lung cancer with weakness, sepsis of unclear source'Blood cultures, urine cultures did not show any growth so far. Oncology does not think it's from Neulasta. And any empiric antibiotics today also, today will be third day. Genital discharge. 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 Consulted oncology.  3.history of radiation pneumonitis: #4 acute on chronic anemia, chemotherapy-induced anemia with symptoms of fatigue and shortness of breath: Transfused  of 2 units of packed RBC as per  Oncology recommendation,  #5 explosive  diarrhea, nausea, generalized weakness,  fatigue and shortness of breath likely secondary to [facility] a chemotherapy that he is receiving for lung cancer. Improved after starting the prednisone. Patient can take Imodium. C. difficile is negative.  . 6.  Non severe malnutrition the context of chronic illness:  continue ensure Enlive. Unable to discharge today because of  explosive diarrhea, generalized weakness happening again. Start the Imodium round-the-clock today and see if it helps  . All the records are reviewed and case discussed with Care Management/Social Workerr. Management plans discussed with the patient, family and they are in agreement.  CODE STATUS: full  TOTAL TIME TAKING CARE OF THIS PATIENT: 38mnutes.   POSSIBLE D/C IN 1-2 DAYS, DEPENDING ON CLINICAL CONDITION.   KEpifanio LeschesM.D on 01/15/2016 at 10:57 AM  Between 7am to 6pm - Pager - [phone]  After 6pm go to [email or web address] - password [email or web address]  Office  [phone] CC: Primary care physician; PROVIDER NOT IN SYSTEM   Note: This dictation was prepared with Dragon dictation along with smaller phrase technology. Any transcriptional errors that result from this process are unintentional.

## 2016-01-15 NOTE — Progress Notes (Signed)
Luis [name]   DOB:29-Oct-1965   [id number]    Subjective: Patient is resting in the bed. Accompanied by his wife. He still feels weak. He continues to have diarrhea about 3 loose stools this morning Denies abdominal pain. Denies any blood in stools. No nausea no vomiting.  ROS: No cough chest pain. Complains of shortness of breath on exertion. No fevers or chills.  Objective:  Filed Vitals:   01/14/16 1948 01/15/16 0553  BP: 114/73 103/69  Pulse: 84 81  Temp: 98.4 F (36.9 C) 98.4 F (36.9 C)  Resp:       Intake/Output Summary (Last 24 hours) at 01/15/16 1210 Last data filed at 01/15/16 0246  Gross per 24 hour  Intake    750 ml  Output    225 ml  Net    525 ml    GENERAL: Thin built poorly nourished male patient resting in the bed Alert, no distress and comfortable. Accompanied by his wife EYES: no pallor or icterus; left eyelid drooping.  OROPHARYNX: no thrush or ulceration. NECK: supple, no masses felt LYMPH: no palpable lymphadenopathy in the cervical, axillary or inguinal regions LUNGS: decreased breath sounds to auscultation at bases and No wheeze or crackles HEART/CVS: regular rate & rhythm and no murmurs; No lower extremity edema ABDOMEN: abdomen soft, non-tender and normal bowel sounds Musculoskeletal:no cyanosis of digits and no clubbing  PSYCH: alert & oriented x 3 with fluent speech NEURO: no focal motor/sensory deficits SKIN: no rashes or significant lesions   Labs:  Lab Results  Component Value Date   WBC 27.5* 01/15/2016   HGB 10.7* 01/15/2016   HCT 32.6* 01/15/2016   MCV 70.5* 01/15/2016   PLT 229 01/15/2016   NEUTROABS 24.2* 01/11/2016    Lab Results  Component Value Date   NA 134* 01/12/2016   K 3.8 01/12/2016   CL 106 01/12/2016   CO2 22 01/12/2016    Studies:  No results found.  Assessment & Plan:   50 year old male patient with history of EGFR mutated adenocarcinoma the lung/stage IV- currently on chemotherapy with  Alimta-carboplatin-Keytruda status post cycle #1 [June 20th] currently admitted to the hospital for generalized weakness/diarrhea/shortness of breath.  # Diarrhea- multiple loose stools;- Question chemotherapy unlikely Keytruda. C. Difficile/stool cultures negative. Continue prednisone 60 mg for now; start tapering down more.   # Adenocarcinoma of the lung stage IV-on palliative chemotherapy with Alimta carboplatin Keytruda s/p # 1 cycle; given for treatment on the 11th again.   # Leukocytosis- likely paraneoplastic/underlying lung cancer. Patient has not received growth factor support- like neulasta/neupogen. Agree with ruling out infectious causes; antibiotics  # Anemia- hemoglobin 7 multifactorial- chemotherapy and malignancy. Status post 2 units hemoglobin up to 9.  # I had a long discussion the patient and his wife regarding the overall poor prognosis- especially with his declining performance status; however his life expectancy dependent upon response to current chemotherapy- if he responds- life expectancy would be in the order of many months; however if he does not respond to chemotherapy/and continues to have side effects- life expectancy would be few months. I will plan to get a CT scan after 2 cycles. He and his wife understand the overall poor prognosis.   Cammie Sickle, MD 01/15/2016  12:10 PM

## 2016-01-15 NOTE — Care Management (Signed)
Admitted to [facility] with the diagnosis of sepsis. Lives with wife, Mya [phone]). Goes to [employer] for physician services. No home Health. No skilled facility. No home oxygen. Prescriptions are filled at [facility]. Takes care of all basic activities of daily living himself. Can drive, but "don't drive anymore." Decreased appetite x 3 months. Ten pound weight loss. Fell last Friday. Uses a rolling walker to aid in ambulation. Wife will transport. Physical therapy evaluation completed. Recommends home with home health/physical therapy. Discussed agencies. Chose [facility]. Possible discharge 01/16/16 per Dr. Nira Retort RN MSN CCM Care Management [phone]

## 2016-01-15 NOTE — Plan of Care (Signed)
Problem: Physical Regulation: Goal: Signs and symptoms of infection will decrease Outcome: Progressing Remains on IV Antibiotics. Improvement noted.

## 2016-01-16 DIAGNOSIS — Z7952 Long term (current) use of systemic steroids: Secondary | ICD-10-CM

## 2016-01-16 LAB — CULTURE, BLOOD (ROUTINE X 2)
Culture: NO GROWTH
Culture: NO GROWTH

## 2016-01-16 LAB — BASIC METABOLIC PANEL
Anion gap: 8 (ref 5–15)
BUN: 16 mg/dL (ref 6–20)
CALCIUM: 7.8 mg/dL — AB (ref 8.9–10.3)
CHLORIDE: 106 mmol/L (ref 101–111)
CO2: 24 mmol/L (ref 22–32)
CREATININE: 0.79 mg/dL (ref 0.61–1.24)
Glucose, Bld: 84 mg/dL (ref 65–99)
Potassium: 3.5 mmol/L (ref 3.5–5.1)
SODIUM: 138 mmol/L (ref 135–145)

## 2016-01-16 LAB — CBC
HCT: 32.5 % — ABNORMAL LOW (ref 40.0–52.0)
HEMOGLOBIN: 10.7 g/dL — AB (ref 13.0–18.0)
MCH: 23.4 pg — ABNORMAL LOW (ref 26.0–34.0)
MCHC: 32.9 g/dL (ref 32.0–36.0)
MCV: 71.1 fL — ABNORMAL LOW (ref 80.0–100.0)
PLATELETS: 235 10*3/uL (ref 150–440)
RBC: 4.57 MIL/uL (ref 4.40–5.90)
RDW: 27.7 % — AB (ref 11.5–14.5)
WBC: 29.4 10*3/uL — ABNORMAL HIGH (ref 3.8–10.6)

## 2016-01-16 MED ORDER — ENSURE ENLIVE PO LIQD: 237.0000 mL | Freq: Three times a day (TID) | ORAL | Status: AC

## 2016-01-16 MED ORDER — PREDNISONE 10 MG PO TABS: 40.0000 mg | ORAL_TABLET | Freq: Every day | ORAL | Status: AC

## 2016-01-16 MED ORDER — LOPERAMIDE HCL 2 MG PO CAPS: 2.0000 mg | ORAL_CAPSULE | ORAL | Status: AC | PRN

## 2016-01-16 NOTE — Care Management (Signed)
Discharge to home today per Dr. Benjie Karvonen. Will be followed by [facility]. Family will transport. Shelbie Ammons RN MSN CCM Care Management [phone]

## 2016-01-16 NOTE — Plan of Care (Signed)
Discharged via wheelchair by Nursing Staff. Belongings sent with patient and family.

## 2016-01-16 NOTE — Progress Notes (Signed)
Referral for [facility] services of nursing, PT and aide received from [place]. 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 He has been undergoing chemotherapy and palliative radiation. Per chart note review he was thought to be septic and has received IV antibiotics. He has also been treated for diarrhea with imodium, C-dif negative. Writeer spoke in the patient's room with his regarding [facility] services, brochure and contact information given, he is agreeable to services. No DME needs at this time. Plan is for discharge home via car today with his wife, who is his primary caregiver. Next scheduled treatment is 7/11. Thank you for the opportunity to be involved in the care of this patient. Oneta Rack RN, BSN, [place] hospital Liaison [phone] c

## 2016-01-16 NOTE — Discharge Summary (Signed)
[place] [facility] [place] NAME: Luis [name]    MR#:  [id number]  DATE OF BIRTH:  [birth year]  DATE OF ADMISSION:  01/11/2016 ADMITTING PHYSICIAN: Lance Coon, MD  DATE OF DISCHARGE: 01/16/2016  PRIMARY CARE PHYSICIAN: PROVIDER NOT IN SYSTEM    ADMISSION DIAGNOSIS:  Sepsis, due to unspecified organism ([place]) [A41.9]  DISCHARGE DIAGNOSIS:  Principal Problem:  Weakness Active Problems:   Essential hypertension, benign   Metastatic primary lung cancer ([place])   Malnutrition of moderate degree   Diarrhea   SECONDARY DIAGNOSIS:   Past Medical History  Diagnosis Date  . HTN (hypertension)   . Brain lesion   . 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 History of radiation therapy   . Adenocarcinoma of lung ([place]) 12/05/2014    HOSPITAL COURSE:   50 year old male with history of stage IV lung cancer currently receiving chemotherapy presented with weakness and thought to have sepsis.  1. Generalized weakness: Initially he was suspected to have sepsis. He was started on broad-spectrum IV into buttocks and IV fluids. There was no source of sepsis. Sepsis was ruled out.  2. Diarrhea: Patient continue to have diarrhea through his hospitalization. This is thought to be due to chemotherapy. Oncology was consulted. GI panel and C. difficile were negative. His diarrhea was improved after starting oral steroids. His own will be tapered as an outpatient by oncology team.  3. Adenocarcinoma of the lung stage IV: Patient is on palliative chemotherapy. Follow up with oncology as outpatient.  4. Leukocytosis: This is due to paraneoplastic/underlying lung cancer. There is no source of acute infection. Blood cultures and urine culture were negative.  5. Acute blood loss anemia on chronic anemia: Patient received 2 units of PRBCs and hemoglobin remained stable.  DISCHARGE CONDITIONS AND DIET:   Patient stable for discharge on regular diet  CONSULTS OBTAINED:  Treatment  Team:  Cammie Sickle, MD  DRUG ALLERGIES:  No Known Allergies  DISCHARGE MEDICATIONS:   Current Discharge Medication List    START taking these medications   Details  feeding supplement, ENSURE ENLIVE, (ENSURE ENLIVE) LIQD Take 237 mLs by mouth 3 (three) times daily between meals. Qty: 237 mL, Refills: 12    loperamide (IMODIUM) 2 MG capsule Take 1 capsule (2 mg total) by mouth as needed for diarrhea or loose stools. Qty: 30 capsule, Refills: 0   Associated Diagnoses: Diarrhea, unspecified type    predniSONE (DELTASONE) 10 MG tablet Take 4 tablets (40 mg total) by mouth daily with breakfast. Qty: 50 tablet, Refills: 0      CONTINUE these medications which have NOT CHANGED   Details  calcium-vitamin D (OSCAL WITH D) 500-200 MG-UNIT tablet Take 1 tablet by mouth 2 (two) times daily. Qty: 60 tablet, Refills: 3    folic acid (FOLVITE) 1 MG tablet Take 1 tablet (1 mg total) by mouth daily. Qty: 90 tablet, Refills: 1   Associated Diagnoses: Metastatic primary lung cancer, right (HCC)    ondansetron (ZOFRAN) 8 MG tablet Take 1 tablet (8 mg total) by mouth every 8 (eight) hours as needed for nausea or vomiting. Qty: 20 tablet, Refills: 0   Associated Diagnoses: Adenocarcinoma of lung, right ([place]); Chemotherapy induced nausea and vomiting    Oxycodone HCl 10 MG TABS Take 10 mg by mouth every 8 (eight) hours as needed (for severe pain).    prochlorperazine (COMPAZINE) 10 MG tablet Take 1 tablet (10 mg total) by mouth every 6 (six) hours as needed  for nausea or vomiting. Qty: 30 tablet, Refills: 0   Associated Diagnoses: Metastatic primary lung cancer, right ([place])      STOP taking these medications     dexamethasone (DECADRON) 4 MG tablet               Today   CHIEF COMPLAINT:  Patient is doing better this morning. Diarrhea has improved.   VITAL SIGNS:  Blood pressure 110/56, pulse 84, temperature 98.6 F (37 C), temperature source Oral, resp. rate 18,  height '5\' 7"'$  (1.702 m), weight 55.792 kg (123 lb), SpO2 98 %.   REVIEW OF SYSTEMS:  Review of Systems  Constitutional: Positive for malaise/fatigue. Negative for fever and chills.  HENT: Negative for ear discharge, ear pain, hearing loss, nosebleeds and sore throat.   Eyes: Negative for blurred vision and pain.  Respiratory: Negative for cough, hemoptysis, shortness of breath and wheezing.   Cardiovascular: Negative for chest pain, palpitations and leg swelling.  Gastrointestinal: Positive for diarrhea (better). Negative for nausea, vomiting, abdominal pain and blood in stool.  Genitourinary: Negative for dysuria.  Musculoskeletal: Negative for back pain.  Neurological: Negative for dizziness, tremors, speech change, focal weakness, seizures and headaches.  Endo/Heme/Allergies: Does not bruise/bleed easily.  Psychiatric/Behavioral: Negative for depression, suicidal ideas and hallucinations.     PHYSICAL EXAMINATION:  GENERAL:  50 y.o.-year-old patient lying in the bed with no acute distress. Frail appearing NECK:  Supple, no jugular venous distention. No thyroid enlargement, no tenderness.  LUNGS: Normal breath sounds bilaterally, no wheezing, rales,rhonchi  No use of accessory muscles of respiration.  CARDIOVASCULAR: S1, S2 normal. No murmurs, rubs, or gallops.  ABDOMEN: Soft, non-tender, non-distended. Bowel sounds present. No organomegaly or mass.  EXTREMITIES: No pedal edema, cyanosis, or clubbing.  PSYCHIATRIC: The patient is alert and oriented x 3.  SKIN: No obvious rash, lesion, or ulcer.   DATA REVIEW:   CBC  Recent Labs Lab 01/16/16 0605  WBC 29.4*  HGB 10.7*  HCT 32.5*  PLT 235    Chemistries   Recent Labs Lab 01/11/16 1937  01/16/16 0602  NA 133*  < > 138  K 3.6  < > 3.5  CL 99*  < > 106  CO2 24  < > 24  GLUCOSE 112*  < > 84  BUN 14  < > 16  CREATININE 0.80  < > 0.79  CALCIUM 8.0*  < > 7.8*  AST 19  --   --   ALT 28  --   --   ALKPHOS 235*  --    --   BILITOT 0.7  --   --   < > = values in this interval not displayed.  Cardiac Enzymes  Recent Labs Lab 01/11/16 1937  TROPONINI <0.03    Microbiology Results  '@[id number]'$ @  RADIOLOGY:  No results found.    Management plans discussed with the patient and he is in agreement. Stable for discharge home with [facility]  Patient should follow up with Dr Rebeca Alert next week  CODE STATUS:     Code Status Orders        Start     Ordered   01/12/16 0048  Full code   Continuous     01/12/16 0047    Code Status History    Date Active Date Inactive Code Status Order ID Comments User Context   09/30/2015  7:53 PM 10/03/2015  4:32 PM Full Code [id number]  Idelle Crouch, MD Inpatient   09/10/2015  1:58 PM  09/12/2015  9:13 PM Full Code [id number]  Loletha Grayer, MD ED   05/29/2015 10:13 AM 05/30/2015  3:29 AM Full Code [id number]  Inez Catalina, MD HOV   03/07/2014  7:04 PM 03/11/2014  6:11 PM Full Code [id number]  Kristeen Miss, MD Inpatient   03/06/2014 10:45 PM 03/07/2014  7:04 PM Full Code [id number]  Shanda Howells, MD Inpatient      TOTAL TIME TAKING CARE OF THIS PATIENT: 35 minutes.    Note: This dictation was prepared with Dragon dictation along with smaller phrase technology. Any transcriptional errors that result from this process are unintentional.  Harjas Biggins M.D on 01/16/2016 at 10:41 AM  Between 7am to 6pm - Pager - [phone] After 6pm go to [email or web address] - password [email or web address] [facility]  Office  [phone]  CC: Primary care physician; PROVIDER NOT IN SYSTEM

## 2016-01-16 NOTE — Progress Notes (Signed)
Luis [name]   DOB:02-21-1966   [id number]    Subjective: Patient is resting in the bed. States his diarrhea is improving Denies abdominal pain. Denies any blood in stools. No nausea no vomiting.  ROS: No cough chest pain. Complains of shortness of breath on exertion. No fevers or chills.  Objective:  Filed Vitals:   01/16/16 0805 01/16/16 1339  BP: 110/56 117/82  Pulse: 84 91  Temp: 98.6 F (37 C) 98.4 F (36.9 C)  Resp: 18 28    No intake or output data in the 24 hours ending 01/16/16 1958  GENERAL: Thin built poorly nourished male patient resting in the bed Alert, no distress and comfortable.He is alone. EYES: no pallor or icterus; left eyelid drooping.  OROPHARYNX: no thrush or ulceration. NECK: supple, no masses felt LYMPH: no palpable lymphadenopathy in the cervical, axillary or inguinal regions LUNGS: decreased breath sounds to auscultation at bases and No wheeze or crackles HEART/CVS: regular rate & rhythm and no murmurs; No lower extremity edema ABDOMEN: abdomen soft, non-tender and normal bowel sounds Musculoskeletal:no cyanosis of digits and no clubbing  PSYCH: alert & oriented x 3 with fluent speech NEURO: no focal motor/sensory deficits SKIN: no rashes or significant lesions   Labs:  Lab Results  Component Value Date   WBC 29.4* 01/16/2016   HGB 10.7* 01/16/2016   HCT 32.5* 01/16/2016   MCV 71.1* 01/16/2016   PLT 235 01/16/2016   NEUTROABS 24.2* 01/11/2016    Lab Results  Component Value Date   NA 138 01/16/2016   K 3.5 01/16/2016   CL 106 01/16/2016   CO2 24 01/16/2016    Studies:  No results found.  Assessment & Plan:   50 year old male patient with history of EGFR mutated adenocarcinoma the lung/stage IV- currently on chemotherapy with Alimta-carboplatin-Keytruda status post cycle #1 [June 20th] currently admitted to the hospital for generalized weakness/diarrhea/shortness of breath.  # Diarrhea- multiple loose stools;- Question chemotherapy  unlikely Keytruda. C. Difficile/stool cultures negative. Continue prednisone- we'll taper outpatient.  # Adenocarcinoma of the lung stage IV-on palliative chemotherapy with Alimta carboplatin Keytruda s/p # 1 cycle; given for treatment on the 11th again.   # Leukocytosis- likely paraneoplastic/underlying lung cancer. Patient has not received growth factor support- like neulasta/neupogen. Agree with ruling out infectious causes; antibiotics  # Anemia- hemoglobin 7 multifactorial- chemotherapy and malignancy. Status post 2 units hemoglobin up to 9.  # The above plan of care was discussed with Dr.Modi. Plan follow-up July 10 as planned.   Cammie Sickle, MD 01/16/2016  7:58 PM

## 2016-01-16 NOTE — Progress Notes (Signed)
MD making rounds. Discharge orders received. IV removed. One prescription given to patient. Also, pescriptions E-Scribed to pharmacy. Discharge paperwork provided, explained, signed and witnessed. No unanswered questions.

## 2016-01-21 ENCOUNTER — Inpatient Hospital Stay: Payer: BLUE CROSS/BLUE SHIELD

## 2016-01-21 ENCOUNTER — Inpatient Hospital Stay (HOSPITAL_BASED_OUTPATIENT_CLINIC_OR_DEPARTMENT_OTHER): Payer: BLUE CROSS/BLUE SHIELD | Admitting: Internal Medicine

## 2016-01-21 ENCOUNTER — Inpatient Hospital Stay: Payer: BLUE CROSS/BLUE SHIELD | Attending: Internal Medicine

## 2016-01-21 VITALS — BP 111/79 | HR 103 | Temp 99.2°F | Resp 18 | Wt 120.8 lb

## 2016-01-21 DIAGNOSIS — I10 Essential (primary) hypertension: Secondary | ICD-10-CM | POA: Diagnosis not present

## 2016-01-21 DIAGNOSIS — C7801 Secondary malignant neoplasm of right lung: Secondary | ICD-10-CM | POA: Insufficient documentation

## 2016-01-21 DIAGNOSIS — C7931 Secondary malignant neoplasm of brain: Secondary | ICD-10-CM | POA: Insufficient documentation

## 2016-01-21 DIAGNOSIS — C7951 Secondary malignant neoplasm of bone: Secondary | ICD-10-CM | POA: Insufficient documentation

## 2016-01-21 DIAGNOSIS — D72829 Elevated white blood cell count, unspecified: Secondary | ICD-10-CM | POA: Diagnosis not present

## 2016-01-21 DIAGNOSIS — R634 Abnormal weight loss: Secondary | ICD-10-CM

## 2016-01-21 DIAGNOSIS — R110 Nausea: Secondary | ICD-10-CM | POA: Insufficient documentation

## 2016-01-21 DIAGNOSIS — D6489 Other specified anemias: Secondary | ICD-10-CM | POA: Diagnosis not present

## 2016-01-21 DIAGNOSIS — C3412 Malignant neoplasm of upper lobe, left bronchus or lung: Secondary | ICD-10-CM | POA: Insufficient documentation

## 2016-01-21 DIAGNOSIS — R531 Weakness: Secondary | ICD-10-CM

## 2016-01-21 DIAGNOSIS — Z7952 Long term (current) use of systemic steroids: Secondary | ICD-10-CM

## 2016-01-21 DIAGNOSIS — Z5111 Encounter for antineoplastic chemotherapy: Secondary | ICD-10-CM

## 2016-01-21 DIAGNOSIS — R197 Diarrhea, unspecified: Secondary | ICD-10-CM | POA: Insufficient documentation

## 2016-01-21 DIAGNOSIS — T451X5S Adverse effect of antineoplastic and immunosuppressive drugs, sequela: Secondary | ICD-10-CM | POA: Insufficient documentation

## 2016-01-21 DIAGNOSIS — R630 Anorexia: Secondary | ICD-10-CM

## 2016-01-21 DIAGNOSIS — C3491 Malignant neoplasm of unspecified part of right bronchus or lung: Secondary | ICD-10-CM

## 2016-01-21 DIAGNOSIS — Z79899 Other long term (current) drug therapy: Secondary | ICD-10-CM | POA: Diagnosis not present

## 2016-01-21 LAB — COMPREHENSIVE METABOLIC PANEL
ALBUMIN: 2.4 g/dL — AB (ref 3.5–5.0)
ALT: 78 U/L — AB (ref 17–63)
AST: 31 U/L (ref 15–41)
Alkaline Phosphatase: 407 U/L — ABNORMAL HIGH (ref 38–126)
Anion gap: 7 (ref 5–15)
BILIRUBIN TOTAL: 0.4 mg/dL (ref 0.3–1.2)
BUN: 18 mg/dL (ref 6–20)
CO2: 20 mmol/L — ABNORMAL LOW (ref 22–32)
CREATININE: 0.58 mg/dL — AB (ref 0.61–1.24)
Calcium: 7.5 mg/dL — ABNORMAL LOW (ref 8.9–10.3)
Chloride: 103 mmol/L (ref 101–111)
GFR calc Af Amer: >60 mL/min (ref >60)
GLUCOSE: 121 mg/dL — AB (ref 65–99)
POTASSIUM: 3.8 mmol/L (ref 3.5–5.1)
Sodium: 130 mmol/L — ABNORMAL LOW (ref 135–145)
TOTAL PROTEIN: 6.8 g/dL (ref 6.5–8.1)

## 2016-01-21 LAB — CBC WITH DIFFERENTIAL/PLATELET
Band Neutrophils: 23 %
Basophils Absolute: 0 10*3/uL (ref 0–0.1)
Basophils Relative: 0 %
EOS ABS: 5.9 10*3/uL — AB (ref 0–0.7)
EOS PCT: 14 %
HEMATOCRIT: 32.8 % — AB (ref 40.0–52.0)
HEMOGLOBIN: 10.9 g/dL — AB (ref 13.0–18.0)
LYMPHS ABS: 1.3 10*3/uL (ref 1.0–3.6)
LYMPHS PCT: 3 %
MCH: 23.1 pg — ABNORMAL LOW (ref 26.0–34.0)
MCHC: 33.2 g/dL (ref 32.0–36.0)
MCV: 69.5 fL — AB (ref 80.0–100.0)
METAMYELOCYTES PCT: 3 %
MONO ABS: 0.8 10*3/uL (ref 0.2–1.0)
MONOS PCT: 2 %
MYELOCYTES: 2 %
NEUTROS PCT: 53 %
Neutro Abs: 34 10*3/uL — ABNORMAL HIGH (ref 1.4–6.5)
Platelets: 301 10*3/uL (ref 150–440)
RBC: 4.72 MIL/uL (ref 4.40–5.90)
RDW: 29.3 % — AB (ref 11.5–14.5)
WBC: 42 10*3/uL — ABNORMAL HIGH (ref 3.8–10.6)

## 2016-01-21 NOTE — Assessment & Plan Note (Addendum)
#   Currently on palliative chemotherapy with- carboplatin and Alimta every 3 weeks. Tolerating chemotherapy with moderate-severe side effects [C discussion below]  # Proceed with cycle #2 chemotherapy today.  I had a long discussion with the patient regarding- the difficult situation given the aggressive progression/his poor tolerance to therapy. Discussed also hospice if he continues to have continued poor tolerance to therapy. For now we'll proceed with cycle #2- his understanding of the potential side effects involved.  # Poor appetite on steroids- recommend tapering the steroids down to 40 mg once a day x 10days  # Leucocytosis likely paraneoplastic/steroids.   # anemia- sec to malignancy  # diarrhea from chemo- on lomotil as  Needed.  # Follow up with me in 10 days/labs possible IV fluids; and again on the 14th for IVF  # L-4 bone metastasis on RT.

## 2016-01-21 NOTE — Patient Instructions (Signed)
Starting today new dose- take Prednisone 10 mg- take 2 tablets of oral steroids every day.

## 2016-01-21 NOTE — Progress Notes (Signed)
[place] OFFICE PROGRESS NOTE  Patient Care Team: Provider Not In System as PCP - General  Metastatic primary lung cancer [facility])   Staging form: Lung, AJCC 7th Edition     Clinical: No stage assigned - Unsigned    Oncology History   # AUG 2015- METASTATIC ADENO CA LUNG POSITIVE for EGFR Exon 21 [id number] [neg- ALK;[name];[name],MET]; [name]; NOV [id number]- CT- Progression [RUL; LUL & Pelvic sclerotic lesions]; [name];[name]; [name]; [name]; stable lung nodule; increasing L1 [asymptomatic];   # Feb 9th 2017- START Arta Silence start sec to insurance/[place]]  # April 2017- No disease in chest; LUL- radiatio changes; s/p pred  # June 2017- PROGRESSION- START carbo-Alimta q3 W  # AUG 2015- Thoracic spine mets s/p decompression & Cerebellar brain met s/p RT; NOV 2016- MRI BRAIN-PROGRESS-NEW multiple brain lesions [~47m]; Feb 9th MRI- subtle increase/number in size of brain lesions [~525m- asymptomatic. Feb 28th MRI- Brain mets; April 2017- STABLE Thoracic/cervical spine MRI  # Bony mets- on X-geva q 6 w      Primary malignant ne[place] of left upper lobe of lung ([place]  01/21/2016 Initial Diagnosis [facility]   INTERVAL HISTORY:  [age]ear old asian male patient with above history of metastatic adenocarcinoma of the lung with EGFR mutation With recent progression in June 2017- currently on chemotherapy with carboplatin and Alimta approximately 3 weeks ago.  Patient was admitted to the hospital for generalized weakness and diarrhea; leukocytosis. Infectious workup was negative. Patient was started on prednisone for generalized weakness/  Patient continues to feel poorly; . He has lost weight.  Poor appetite. Nausea no vomiting. Intermittent diarrhea. No unusual shortness of breath or chest pain.  Chronic cough. No hemoptysis. No headaches. He is currently wheelchair./Walks with a walker.  REVIEW OF SYSTEMS:  A complete 10 point review of system is done which is negative except mentioned above/history of present illness.   PAST MEDICAL HISTORY :  Past Medical History  Diagnosis Date  . HTN (hypertension)   . Brain lesion   . 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 History of radiation therapy   . Ad[place]ocarcinoma of lung [place] [place]    PAST SURGICAL HISTORY :   Past Surgical History  Procedure Laterality Date  . Laminectomy N/A 03/07/2014    Procedure: T1-T2 Laminectomy with Decompression of Cord and Removal of Cancer  thoracic one/two;  Surgeon: HeKristeen MissMD;  [facility];  Service: Neurosurgery;  Laterality: N/A;  . Ct guided biopsy  ([facility] hx)  05/28/2014  . Esophagogastroduodenoscopy N/A 10/01/2015    Procedure: ESOPHAGOGASTRODUODENOSCOPY (EGD);  Surgeon: PaHulen LusterMD;  [facility];  Service: Endoscopy;  Laterality: N/A;    FAMILY HISTORY :   Family History  Problem Relation Age of Onset  . Colon cancer Mother   . Heart disease Father   . Hypertension Sister   . Hypertension Brother     SOCIAL HISTORY:   Social History  Substance Use Topics  . Smoking status: Never Smoker   . Smokeless tobacco: Never Used  . Alcohol Use: 0.5 oz/week    1 Standard drinks or equivalent per week     Comment: none for a year    ALLERGIES:  has No Known Allergies.  MEDICATIONS:  Current Outpatient Prescriptions  Medication Sig  Dispense Refill  . calcium-vitamin D (OSCAL WITH D) 500-200 MG-UNIT tablet Take 1 tablet by mouth 2 (two) times daily. 60 tablet 3  . feeding supplement, ENSURE ENLIVE, (ENSURE ENLIVE) LIQD Take 237 mLs by mouth 3 (three) times daily between meals. 572 mL 12  . folic acid (FOLVITE) 1 MG tablet Take 1 tablet (1 mg total) by mouth daily. 90 tablet 1  . loperamide (IMODIUM) 2 MG capsule Take 1 capsule (2 mg total) by mouth as needed for  diarrhea or loose stools. 30 capsule 0  . ondansetron (ZOFRAN) 8 MG tablet Take 1 tablet (8 mg total) by mouth every 8 (eight) hours as needed for nausea or vomiting. 20 tablet 0  . Oxycodone HCl 10 MG TABS Take 10 mg by mouth every 8 (eight) hours as needed (for severe pain).    . predniSONE (DELTASONE) 10 MG tablet Take 4 tablets (40 mg total) by mouth daily with breakfast. 50 tablet 0  . prochlorperazine (COMPAZINE) 10 MG tablet Take 1 tablet (10 mg total) by mouth every 6 (six) hours as needed for nausea or vomiting. 30 tablet 0   No current facility-administered medications for this visit.    PHYSICAL EXAMINATION: ECOG PERFORMANCE STATUS: 2 - Symptomatic, <50% confined to bed  BP 111/79 mmHg  Pulse 103  Temp(Src) 99.2 F (37.3 C) (Tympanic)  Resp 18  Wt 120 lb 13 oz (54.8 kg)  SpO2   Filed Weights   01/21/16 0953  Weight: 120 lb 13 oz (54.8 kg)    GENERAL: Thin build cachectic; Alert, no distress and comfortable. Accompanied by his wife.He is in a wheelchair. EYES: no pallor or icterus OROPHARYNX: no thrush or ulceration; poor dentition. NECK: supple, no masses felt LYMPH: no palpable lymphadenopathy in the cervical, axillary or inguinal regions LUNGS: c decreased breath sounds nd No wheeze or crackles HEART/CVS: regular rate & rhythm and no murmurs; No lower extremity edema ABDOMEN: abdomen soft, non-tender and normal bowel sounds Musculoskeletal:no cyanosis of digits and no clubbing  PSYCH: alert & oriented x 3 with fluent speech NEURO: no focal motor/sensory deficits SKIN: no rashes or significant lesions  LABORATORY DATA:  I have reviewed the data as listed    Component Value Date/Time   NA 130* 01/21/2016 0930   NA 141 11/08/2014 1505   K 3.8 01/21/2016 0930   K 3.7 11/08/2014 1505   CL 103 01/21/2016 0930   CL 106 11/08/2014 1505   CO2 20* 01/21/2016 0930   CO2 29 11/08/2014 1505   GLUCOSE 121* 01/21/2016 0930   GLUCOSE 75 11/08/2014 1505   BUN 18  01/21/2016 0930   BUN 13 11/08/2014 1505   CREATININE 0.58* 01/21/2016 0930   CREATININE 1.15 11/08/2014 1505   CALCIUM 7.5* 01/21/2016 0930   CALCIUM 9.1 11/08/2014 1505   PROT 6.8 01/21/2016 0930   PROT 7.7 11/08/2014 1505   ALBUMIN 2.4* 01/21/2016 0930   ALBUMIN 4.0 11/08/2014 1505   AST 31 01/21/2016 0930   AST 31 11/08/2014 1505   ALT 78* 01/21/2016 0930   ALT 53 11/08/2014 1505   ALKPHOS 407* 01/21/2016 0930   ALKPHOS 62 11/08/2014 1505   BILITOT 0.4 01/21/2016 0930   BILITOT 0.6 11/08/2014 1505   GFRNONAA >60 01/21/2016 0930   GFRNONAA >60 11/08/2014 1505   GFRNONAA >60 08/01/2014 1023   GFRAA >60 01/21/2016 0930   GFRAA >60 11/08/2014 1505   GFRAA >60 08/01/2014 1023    No results found for: SPEP, UPEP  Lab Results  Component Value Date   WBC 42.0* 01/21/2016   NEUTROABS 34.0* 01/21/2016   HGB 10.9* 01/21/2016   HCT 32.8* 01/21/2016   MCV 69.5* 01/21/2016   PLT 301 01/21/2016      Chemistry      Component Value Date/Time   NA 130* 01/21/2016 0930   NA 141 11/08/2014 1505   K 3.8 01/21/2016 0930   K 3.7 11/08/2014 1505   CL 103 01/21/2016 0930   CL 106 11/08/2014 1505   CO2 20* 01/21/2016 0930   CO2 29 11/08/2014 1505   BUN 18 01/21/2016 0930   BUN 13 11/08/2014 1505   CREATININE 0.58* 01/21/2016 0930   CREATININE 1.15 11/08/2014 1505      Component Value Date/Time   CALCIUM 7.5* 01/21/2016 0930   CALCIUM 9.1 11/08/2014 1505   ALKPHOS 407* 01/21/2016 0930   ALKPHOS 62 11/08/2014 1505   AST 31 01/21/2016 0930   AST 31 11/08/2014 1505   ALT 78* 01/21/2016 0930   ALT 53 11/08/2014 1505   BILITOT 0.4 01/21/2016 0930   BILITOT 0.6 11/08/2014 1505       RADIOGRAPHIC STUDIES: I have personally reviewed the radiological images as listed and agreed with the findings in the report. No results found.   ASSESSMENT & PLAN:  Primary malignant neoplasm of left upper lobe of lung ([place]) # Currently on palliative chemotherapy with- carboplatin and  Alimta every 3 weeks. Tolerating chemotherapy with moderate-severe side effects [C discussion below]  # Proceed with cycle #2 chemotherapy today.  I had a long discussion with the patient regarding- the difficult situation given the aggressive progression/his poor tolerance to therapy. Discussed also hospice if he continues to have continued poor tolerance to therapy. For now we'll proceed with cycle #2- his understanding of the potential side effects involved.  # Poor appetite on steroids- recommend tapering the steroids down to 40 mg once a day x 10days  # Leucocytosis likely paraneoplastic/steroids.   # anemia- sec to malignancy  # diarrhea from chemo- on lomotil as  Needed.  # Follow up with me in 10 days/labs possible IV fluids; and again on the 14th for IVF  # L-4 bone metastasis on RT.    Patient follow-up with me in approximately 10 days post chemotherapy/labs. We will check labs today.  Orders Placed This Encounter  Procedures  . CBC with Differential    Standing Status: Future     Number of Occurrences:      Standing Expiration Date: 01/20/2017  . Comprehensive metabolic panel    Standing Status: Future     Number of Occurrences:      Standing Expiration Date: 01/20/2017    Order Specific Question:  Has the patient fasted?    Answer:  No   All questions were answered. The patient knows to call the clinic with any problems, questions or concerns.      Cammie Sickle, MD 01/21/2016 5:39 PM

## 2016-01-21 NOTE — Progress Notes (Signed)
Patient states he is very weak.  Went to the ED on June 26 for weakness.  Unable to stand or walk.  Admitted. D/C 01-16-16.  Very little appetite.  Drinks Ensure.  Incontinent of urine.

## 2016-01-24 ENCOUNTER — Other Ambulatory Visit: Payer: Self-pay | Admitting: Internal Medicine

## 2016-01-24 DIAGNOSIS — E860 Dehydration: Secondary | ICD-10-CM | POA: Insufficient documentation

## 2016-01-25 ENCOUNTER — Inpatient Hospital Stay: Payer: BLUE CROSS/BLUE SHIELD

## 2016-01-31 ENCOUNTER — Emergency Department: Payer: BLUE CROSS/BLUE SHIELD

## 2016-01-31 ENCOUNTER — Inpatient Hospital Stay: Payer: BLUE CROSS/BLUE SHIELD

## 2016-01-31 ENCOUNTER — Inpatient Hospital Stay: Payer: BLUE CROSS/BLUE SHIELD | Admitting: Oncology

## 2016-01-31 ENCOUNTER — Other Ambulatory Visit: Payer: Self-pay

## 2016-01-31 ENCOUNTER — Inpatient Hospital Stay
Admission: EM | Admit: 2016-01-31 | Discharge: 2016-02-01 | DRG: 543 | Disposition: A | Discharge status: Hospice | Payer: BLUE CROSS/BLUE SHIELD | Attending: Specialist | Admitting: Specialist

## 2016-01-31 VITALS — BP 112/78 | HR 147 | Temp 97.0°F

## 2016-01-31 DIAGNOSIS — R634 Abnormal weight loss: Secondary | ICD-10-CM

## 2016-01-31 DIAGNOSIS — R64 Cachexia: Secondary | ICD-10-CM

## 2016-01-31 DIAGNOSIS — Z9221 Personal history of antineoplastic chemotherapy: Secondary | ICD-10-CM | POA: Diagnosis not present

## 2016-01-31 DIAGNOSIS — E860 Dehydration: Secondary | ICD-10-CM | POA: Diagnosis present

## 2016-01-31 DIAGNOSIS — R110 Nausea: Secondary | ICD-10-CM

## 2016-01-31 DIAGNOSIS — C7801 Secondary malignant neoplasm of right lung: Secondary | ICD-10-CM

## 2016-01-31 DIAGNOSIS — Z8249 Family history of ischemic heart disease and other diseases of the circulatory system: Secondary | ICD-10-CM | POA: Diagnosis not present

## 2016-01-31 DIAGNOSIS — C3412 Malignant neoplasm of upper lobe, left bronchus or lung: Secondary | ICD-10-CM | POA: Diagnosis present

## 2016-01-31 DIAGNOSIS — R05 Cough: Secondary | ICD-10-CM

## 2016-01-31 DIAGNOSIS — R630 Anorexia: Secondary | ICD-10-CM | POA: Diagnosis not present

## 2016-01-31 DIAGNOSIS — D6489 Other specified anemias: Secondary | ICD-10-CM | POA: Diagnosis not present

## 2016-01-31 DIAGNOSIS — R112 Nausea with vomiting, unspecified: Secondary | ICD-10-CM

## 2016-01-31 DIAGNOSIS — Z7952 Long term (current) use of systemic steroids: Secondary | ICD-10-CM | POA: Diagnosis not present

## 2016-01-31 DIAGNOSIS — D72829 Elevated white blood cell count, unspecified: Secondary | ICD-10-CM | POA: Diagnosis not present

## 2016-01-31 DIAGNOSIS — T451X5S Adverse effect of antineoplastic and immunosuppressive drugs, sequela: Secondary | ICD-10-CM | POA: Diagnosis not present

## 2016-01-31 DIAGNOSIS — C3491 Malignant neoplasm of unspecified part of right bronchus or lung: Secondary | ICD-10-CM | POA: Diagnosis present

## 2016-01-31 DIAGNOSIS — Z79899 Other long term (current) drug therapy: Secondary | ICD-10-CM

## 2016-01-31 DIAGNOSIS — C7951 Secondary malignant neoplasm of bone: Secondary | ICD-10-CM | POA: Diagnosis not present

## 2016-01-31 DIAGNOSIS — Z800 Family history of malignant neoplasm of digestive organs: Secondary | ICD-10-CM

## 2016-01-31 DIAGNOSIS — Z515 Encounter for palliative care: Secondary | ICD-10-CM | POA: Diagnosis not present

## 2016-01-31 DIAGNOSIS — Z923 Personal history of irradiation: Secondary | ICD-10-CM

## 2016-01-31 DIAGNOSIS — C7931 Secondary malignant neoplasm of brain: Secondary | ICD-10-CM | POA: Diagnosis present

## 2016-01-31 DIAGNOSIS — Z66 Do not resuscitate: Secondary | ICD-10-CM | POA: Diagnosis not present

## 2016-01-31 DIAGNOSIS — R531 Weakness: Secondary | ICD-10-CM | POA: Diagnosis not present

## 2016-01-31 DIAGNOSIS — I10 Essential (primary) hypertension: Secondary | ICD-10-CM | POA: Diagnosis not present

## 2016-01-31 DIAGNOSIS — R197 Diarrhea, unspecified: Secondary | ICD-10-CM | POA: Diagnosis not present

## 2016-01-31 HISTORY — DX: Malignant neoplasm of unspecified part of unspecified bronchus or lung: C34.90

## 2016-01-31 LAB — COMPREHENSIVE METABOLIC PANEL
ALK PHOS: 532 U/L — AB (ref 38–126)
ALT: 50 U/L (ref 17–63)
ALT: 50 U/L (ref 17–63)
ANION GAP: 12 (ref 5–15)
AST: 40 U/L (ref 15–41)
AST: 38 U/L (ref 15–41)
Albumin: 2.3 g/dL — ABNORMAL LOW (ref 3.5–5.0)
Albumin: 2.2 g/dL — ABNORMAL LOW (ref 3.5–5.0)
Alkaline Phosphatase: 530 U/L — ABNORMAL HIGH (ref 38–126)
Anion gap: 8 (ref 5–15)
BILIRUBIN TOTAL: 1 mg/dL (ref 0.3–1.2)
BILIRUBIN TOTAL: 1 mg/dL (ref 0.3–1.2)
BUN: 13 mg/dL (ref 6–20)
BUN: 13 mg/dL (ref 6–20)
CALCIUM: 7.8 mg/dL — AB (ref 8.9–10.3)
CO2: 18 mmol/L — AB (ref 22–32)
CO2: 19 mmol/L — ABNORMAL LOW (ref 22–32)
CREATININE: 0.87 mg/dL (ref 0.61–1.24)
Calcium: 8.2 mg/dL — ABNORMAL LOW (ref 8.9–10.3)
Chloride: 100 mmol/L — ABNORMAL LOW (ref 101–111)
Chloride: 101 mmol/L (ref 101–111)
Creatinine, Ser: 0.83 mg/dL (ref 0.61–1.24)
GFR calc Af Amer: >60 mL/min (ref >60)
GFR calc non Af Amer: >60 mL/min (ref >60)
GLUCOSE: 127 mg/dL — AB (ref 65–99)
Glucose, Bld: 123 mg/dL — ABNORMAL HIGH (ref 65–99)
POTASSIUM: 4.4 mmol/L (ref 3.5–5.1)
Potassium: 4.4 mmol/L (ref 3.5–5.1)
SODIUM: 126 mmol/L — AB (ref 135–145)
Sodium: 132 mmol/L — ABNORMAL LOW (ref 135–145)
TOTAL PROTEIN: 6.8 g/dL (ref 6.5–8.1)
Total Protein: 6.9 g/dL (ref 6.5–8.1)

## 2016-01-31 LAB — CBC WITH DIFFERENTIAL/PLATELET
BASOS PCT: 1 %
Band Neutrophils: 13 %
Basophils Absolute: 0.7 10*3/uL — ABNORMAL HIGH (ref 0–0.1)
Basophils Absolute: 0 10*3/uL (ref 0–0.1)
Basophils Relative: 0 %
Blasts: 0 %
EOS ABS: 30.6 10*3/uL — AB (ref 0–0.7)
EOS PCT: 41 %
Eosinophils Absolute: 24.5 10*3/uL — ABNORMAL HIGH (ref 0–0.7)
Eosinophils Relative: 35 %
HEMATOCRIT: 34 % — AB (ref 40.0–52.0)
HEMATOCRIT: 34.4 % — AB (ref 40.0–52.0)
HEMOGLOBIN: 11.1 g/dL — AB (ref 13.0–18.0)
Hemoglobin: 11 g/dL — ABNORMAL LOW (ref 13.0–18.0)
LYMPHS PCT: 3 %
Lymphocytes Relative: 4 %
Lymphs Abs: 2.2 10*3/uL (ref 1.0–3.6)
Lymphs Abs: 2.8 10*3/uL (ref 1.0–3.6)
MCH: 22.7 pg — AB (ref 26.0–34.0)
MCH: 22.7 pg — ABNORMAL LOW (ref 26.0–34.0)
MCHC: 32.7 g/dL (ref 32.0–36.0)
MCHC: 31.8 g/dL — ABNORMAL LOW (ref 32.0–36.0)
MCV: 69.5 fL — ABNORMAL LOW (ref 80.0–100.0)
MCV: 71.3 fL — AB (ref 80.0–100.0)
MONO ABS: 0 10*3/uL — AB (ref 0.2–1.0)
MONOS PCT: 2 %
MYELOCYTES: 0 %
Metamyelocytes Relative: 0 %
Monocytes Absolute: 1.5 10*3/uL — ABNORMAL HIGH (ref 0.2–1.0)
Monocytes Relative: 0 %
NEUTROS PCT: 53 %
NEUTROS PCT: 48 %
NRBC: 0 /100{WBCs}
Neutro Abs: 39.7 10*3/uL — ABNORMAL HIGH (ref 1.4–6.5)
Neutro Abs: 42.6 10*3/uL — ABNORMAL HIGH (ref 1.4–6.5)
Other: 0 %
PLATELETS: 174 10*3/uL (ref 150–440)
PROMYELOCYTES ABS: 0 %
Platelets: 182 10*3/uL (ref 150–440)
RBC: 4.9 MIL/uL (ref 4.40–5.90)
RBC: 4.83 MIL/uL (ref 4.40–5.90)
RDW: 30 % — ABNORMAL HIGH (ref 11.5–14.5)
RDW: 30.2 % — ABNORMAL HIGH (ref 11.5–14.5)
SMEAR REVIEW: ADEQUATE
WBC: 74.7 10*3/uL (ref 3.8–10.6)
WBC: 69.9 10*3/uL — AB (ref 3.8–10.6)

## 2016-01-31 LAB — URINALYSIS COMPLETE WITH MICROSCOPIC (ARMC ONLY)
BILIRUBIN URINE: NEGATIVE
Bacteria, UA: NONE SEEN
Glucose, UA: NEGATIVE mg/dL
Ketones, ur: NEGATIVE mg/dL
LEUKOCYTES UA: NEGATIVE
Nitrite: NEGATIVE
PH: 5 (ref 5.0–8.0)
Protein, ur: 30 mg/dL — AB
SPECIFIC GRAVITY, URINE: 1.018 (ref 1.005–1.030)
SQUAMOUS EPITHELIAL / LPF: NONE SEEN

## 2016-01-31 LAB — LACTIC ACID, PLASMA
LACTIC ACID, VENOUS: 2.9 mmol/L — AB (ref 0.5–1.9)
LACTIC ACID, VENOUS: 1 mmol/L (ref 0.5–1.9)

## 2016-01-31 MED ORDER — SODIUM CHLORIDE 0.9 % IV SOLN
Freq: Once | INTRAVENOUS | Status: DC
  Filled 2016-01-31: qty 4

## 2016-01-31 MED ORDER — GADOBENATE DIMEGLUMINE 529 MG/ML IV SOLN
10.0000 mL | Freq: Once | INTRAVENOUS | Status: AC | PRN
  Administered 2016-01-31: 10 mL via INTRAVENOUS

## 2016-01-31 MED ORDER — ONDANSETRON HCL 4 MG PO TABS: 4.0000 mg | ORAL_TABLET | Freq: Four times a day (QID) | ORAL | Status: DC | PRN

## 2016-01-31 MED ORDER — SODIUM CHLORIDE 0.9% FLUSH
3.0000 mL | Freq: Two times a day (BID) | INTRAVENOUS | Status: DC
Start: 2016-01-31 — End: 2016-02-01
  Administered 2016-01-31 – 2016-02-01 (×2): 3 mL via INTRAVENOUS

## 2016-01-31 MED ORDER — PIPERACILLIN-TAZOBACTAM 3.375 G IVPB 30 MIN
3.3750 g | Freq: Once | INTRAVENOUS | Status: AC
  Administered 2016-01-31: 3.375 g via INTRAVENOUS
  Filled 2016-01-31: qty 50

## 2016-01-31 MED ORDER — SODIUM CHLORIDE 0.9 % IV SOLN
Freq: Once | INTRAVENOUS | Status: DC
Start: 2016-01-31 — End: 2016-01-31
  Filled 2016-01-31: qty 1000

## 2016-01-31 MED ORDER — MORPHINE SULFATE (PF) 2 MG/ML IV SOLN
2.0000 mg | INTRAVENOUS | Status: DC | PRN
Start: 2016-01-31 — End: 2016-02-01

## 2016-01-31 MED ORDER — ACETAMINOPHEN 650 MG RE SUPP: 650.0000 mg | Freq: Four times a day (QID) | RECTAL | Status: DC | PRN

## 2016-01-31 MED ORDER — SODIUM CHLORIDE 0.9 % IV BOLUS (SEPSIS)
1000.0000 mL | Freq: Once | INTRAVENOUS | Status: AC
  Administered 2016-01-31: 1000 mL via INTRAVENOUS

## 2016-01-31 MED ORDER — DEXAMETHASONE SODIUM PHOSPHATE 10 MG/ML IJ SOLN
4.0000 mg | Freq: Three times a day (TID) | INTRAMUSCULAR | Status: DC
  Administered 2016-01-31 – 2016-02-01 (×4): 4 mg via INTRAVENOUS
  Filled 2016-01-31 (×4): qty 1

## 2016-01-31 MED ORDER — FOLIC ACID 1 MG PO TABS
1.0000 mg | ORAL_TABLET | Freq: Every day | ORAL | Status: DC
  Administered 2016-02-01: 09:00:00 1 mg via ORAL
  Filled 2016-01-31: qty 1

## 2016-01-31 MED ORDER — CALCIUM CARBONATE-VITAMIN D 500-200 MG-UNIT PO TABS
1.0000 | ORAL_TABLET | Freq: Two times a day (BID) | ORAL | Status: DC
  Administered 2016-01-31 – 2016-02-01 (×2): 1 via ORAL
  Filled 2016-01-31 (×3): qty 1

## 2016-01-31 MED ORDER — LOPERAMIDE HCL 2 MG PO CAPS: 2.0000 mg | ORAL_CAPSULE | ORAL | Status: DC | PRN

## 2016-01-31 MED ORDER — OXYCODONE HCL 5 MG PO TABS: 10.0000 mg | ORAL_TABLET | Freq: Three times a day (TID) | ORAL | Status: DC | PRN

## 2016-01-31 MED ORDER — ENSURE ENLIVE PO LIQD
237.0000 mL | Freq: Three times a day (TID) | ORAL | Status: DC
  Administered 2016-02-01 (×2): 237 mL via ORAL

## 2016-01-31 MED ORDER — ACETAMINOPHEN 325 MG PO TABS: 650.0000 mg | ORAL_TABLET | Freq: Four times a day (QID) | ORAL | Status: DC | PRN

## 2016-01-31 MED ORDER — SODIUM CHLORIDE 0.9 % IV SOLN
INTRAVENOUS | Status: DC
  Administered 2016-01-31 – 2016-02-01 (×2): via INTRAVENOUS

## 2016-01-31 MED ORDER — ONDANSETRON HCL 4 MG/2ML IJ SOLN: 4.0000 mg | Freq: Four times a day (QID) | INTRAMUSCULAR | Status: DC | PRN

## 2016-01-31 MED ORDER — POLYETHYLENE GLYCOL 3350 17 G PO PACK: 17.0000 g | PACK | Freq: Every day | ORAL | Status: DC | PRN

## 2016-01-31 MED ORDER — DOCUSATE SODIUM 100 MG PO CAPS
100.0000 mg | ORAL_CAPSULE | Freq: Two times a day (BID) | ORAL | Status: DC
  Administered 2016-01-31 – 2016-02-01 (×2): 100 mg via ORAL
  Filled 2016-01-31 (×2): qty 1

## 2016-01-31 NOTE — Progress Notes (Signed)
MRI results discussed with oncology and also patient. Explained the poor prognosis, per oncology- no further chemo or radiation- rapid [rogression of disease inspite of chemo Recommended hospice- patient opted for hospice at home. Continue steroids Care management consult- already followed by [facility]- need to change to hospice completely. Discussed code status- changed to DNR

## 2016-01-31 NOTE — ED Notes (Signed)
MD Malinda at bedside. 

## 2016-01-31 NOTE — ED Notes (Signed)
Patient transported to MRI 

## 2016-01-31 NOTE — ED Notes (Signed)
Pt cannot be transported up to floor yet at this time per Charge RN Olivia Mackie on 1C. States can come up after 0720. ED charge RN made aware and no further orders given at this time

## 2016-01-31 NOTE — Progress Notes (Signed)
Visit made. Patient was sent to the ED directly from the [facility] for evaluation of increased weakness, pain and dehydration. Patient is currently receiving services from of skilled nursing, home health aide and social work through [facility] [employer].   Patient seen lying on the ED stretcher, appeared to be sleeping. No family at bedside. Writer spoke with both staff RN Gabriel Earing and attending physician Dr. Cinda Quest. Patient is now experiencing leg numbness, WBC elevated at 74.7, sodium 126, lactic acid 2.9. Chest x-ray negative for any aute infection. MRI of the lumbar and thoracic spine has been ordered, oncology to be consulted. Patient is receiving IV dexamethasone and has had IV fluid bolus. Will most likely be admitted. Will continue to follow through final disposition. Thank you. Flo Shanks RN, BSN, [place] [facility]ia[facility] [phone] c

## 2016-01-31 NOTE — Progress Notes (Signed)
Dr. Tressia Miners notified r/t MRI results of spinal cord compression and concerns of impending oncological emergency for spinal cord compression.  Dr. Tressia Miners notified Dr. Ignatius Specking who will come to see the patient and speak to the family about desires of care at this point.

## 2016-01-31 NOTE — Progress Notes (Signed)
Patient very weak today. N/V at home unable to keep down fluids. Heart rate 147.

## 2016-01-31 NOTE — Progress Notes (Signed)
°   01/31/16 1200  Clinical Encounter Type  Visited With Patient  Visit Type Initial  Referral From Chaplain  Consult/Referral To Chaplain  Recommendations Follow-up with family  Spiritual Encounters  Spiritual Needs Other (Comment) (Spiritual conversation of comfort)  Stress Factors  Patient Stress Factors Health changes;Other (Comment) (Concerned about family subsequent to death)  Family Stress Factors Not reviewed  Advance Directives (For Healthcare)  Does patient have an advance directive? No  Patient is very candid about impending death as a result of condition.  Expresses concern about children and wife, as well as insuring death ceremony will be handled appropriately and not a burden on his wife.  Seems to be at peace regarding his own transition however.

## 2016-01-31 NOTE — ED Notes (Signed)
Pt returned from MRI at this time. Pt stable, vitals stable, NAD noted. Pt verbalized no further needs at this time

## 2016-01-31 NOTE — Progress Notes (Unsigned)
Pt brought back to infusion for IV fluids. Pt very diaphoretic, weak, fever 100.2 and HR elevated. Spoke with Dr Rogue Bussing who recommended pt be transferred to ED. Pt taken by bed to ED triage.

## 2016-01-31 NOTE — H&P (Addendum)
[facility] NAME: Luis [name]    MR#:  [id number]  DATE OF BIRTH:  10/04/1965  DATE OF ADMISSION:  01/31/2016  PRIMARY CARE PHYSICIAN: Dr. Charlaine Dalton  REQUESTING/REFERRING PHYSICIAN: Dr. Conni Slipper  CHIEF COMPLAINT:   Chief Complaint  Patient presents with  . Weakness    HISTORY OF PRESENT ILLNESS:  Luis [name]  is a 50 y.o. 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 Patient has right cerebellar metastatic brain lesions and also multiple thoracic vertebral involvement from his cancer. He has been currently on chemotherapy with recent progression of his disease, however having severe side effects and unable to tolerate chemotherapy. Has a walker at home and also wheelchair. Worsening weakness lately. Denies any headaches. Has chills, nausea. His oncologist has been recommending palliative care consult and hospice due to poor prognosis at this time.  PAST MEDICAL HISTORY:   Past Medical History  Diagnosis Date  . HTN (hypertension)   . Brain lesion   . 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 History of radiation therapy   . Adenocarcinoma of lung ([place]) 12/05/2014  . Lung cancer ([place])     metastatic to     PAST SURGICAL HISTORY:   Past Surgical History  Procedure Laterality Date  . Laminectomy N/A 03/07/2014    Procedure: T1-T2 Laminectomy with Decompression of Cord and Removal of Cancer  thoracic one/two;  Surgeon: Kristeen Miss, MD;  Location: [facility];  Service: Neurosurgery;  Laterality: N/A;  . Ct guided biopsy  ([facility] hx)  05/28/2014  . Esophagogastroduodenoscopy N/A 10/01/2015    Procedure: ESOPHAGOGASTRODUODENOSCOPY (EGD);  Surgeon: Hulen Luster, MD;  Location: [facility]  ENDOSCOPY;  Service: Endoscopy;  Laterality: N/A;    SOCIAL HISTORY:   Social History  Substance Use Topics  . Smoking status: Never Smoker   . Smokeless tobacco: Never Used  . Alcohol Use: 0.5 oz/week    1 Standard drinks or equivalent per week     Comment: none for a year    FAMILY HISTORY:   Family History  Problem Relation Age of Onset  . Colon cancer Mother   . Heart disease Father   . Hypertension Sister   . Hypertension Brother     DRUG ALLERGIES:  No Known Allergies  REVIEW OF SYSTEMS:   Review of Systems  Constitutional: Positive for weight loss and malaise/fatigue. Negative for fever and chills.  HENT: Negative for ear discharge, ear pain, nosebleeds and tinnitus.   Eyes: Negative for blurred vision, double vision and photophobia.  Respiratory: Positive for shortness of breath. Negative for cough, hemoptysis and wheezing.   Cardiovascular: Negative for chest pain, palpitations, orthopnea and leg swelling.  Gastrointestinal: Positive for nausea and constipation. Negative for heartburn, vomiting, abdominal pain, diarrhea and melena.  Genitourinary: Negative for dysuria, urgency and frequency.  Musculoskeletal: Positive for myalgias and back pain. Negative for neck pain.  Skin: Negative for rash.  Neurological: Positive for sensory change, focal weakness and weakness. Negative for dizziness, tingling, tremors, speech change and headaches.  Endo/Heme/Allergies: Does not bruise/bleed easily.  Psychiatric/Behavioral: Negative for depression.    MEDICATIONS AT HOME:   Prior to Admission medications   Medication Sig Start Date End Date Taking? Authorizing Provider  calcium-vitamin  D (OSCAL WITH D) 500-200 MG-UNIT tablet Take 1 tablet by mouth 2 (two) times daily. 11/19/15  Yes Cammie Sickle, MD  feeding supplement, ENSURE ENLIVE, (ENSURE ENLIVE) LIQD Take 237 mLs by mouth 3 (three) times daily between meals. 01/16/16  Yes Bettey Costa, MD  folic acid (FOLVITE) 1  MG tablet Take 1 tablet (1 mg total) by mouth daily. 12/28/15  Yes Cammie Sickle, MD  loperamide (IMODIUM) 2 MG capsule Take 1 capsule (2 mg total) by mouth as needed for diarrhea or loose stools. 01/16/16  Yes Sital Mody, MD  ondansetron (ZOFRAN) 8 MG tablet Take 1 tablet (8 mg total) by mouth every 8 (eight) hours as needed for nausea or vomiting. 12/13/15  Yes Cammie Sickle, MD  Oxycodone HCl 10 MG TABS Take 10 mg by mouth every 8 (eight) hours as needed (for severe pain).   Yes Historical Provider, MD  predniSONE (DELTASONE) 10 MG tablet Take 4 tablets (40 mg total) by mouth daily with breakfast. 01/16/16  Yes Bettey Costa, MD  prochlorperazine (COMPAZINE) 10 MG tablet Take 1 tablet (10 mg total) by mouth every 6 (six) hours as needed for nausea or vomiting. 12/28/15  Yes Cammie Sickle, MD      VITAL SIGNS:  Blood pressure 112/81, pulse 101, temperature 99 F (37.2 C), temperature source Oral, resp. rate 29, weight 61.508 kg (135 lb 9.6 oz), SpO2 98 %.  PHYSICAL EXAMINATION:   Physical Exam  GENERAL:  50 y.o.-year-old frail appearing patient lying in the bed with no acute distress.  EYES: Pupils equal, round, reactive to light and accommodation. No scleral icterus. Extraocular muscles intact.  HEENT: Head atraumatic, normocephalic. Oropharynx and nasopharynx clear.  NECK:  Supple, no jugular venous distention. No thyroid enlargement, no tenderness.  LUNGS: Normal breath sounds bilaterally, no wheezing, rales,rhonchi or crepitation. No use of accessory muscles of respiration. Decreased bibasilar breath sounds CARDIOVASCULAR: S1, S2 normal. No murmurs, rubs, or gallops.  ABDOMEN: Soft, nontender, nondistended. Bowel sounds present. No organomegaly or mass.  EXTREMITIES: No pedal edema, cyanosis, or clubbing.  NEUROLOGIC: Cranial nerves II through XII are intact. Muscle strength 5/5 RUE, 3/5 RLE, 2/5 LLE and 4/5 LUE- Sensation intact in all extremities except decreased in left  lower extremity. Gait not checked.  PSYCHIATRIC: The patient is alert and oriented x 3.  SKIN: No obvious rash, lesion, or ulcer.   LABORATORY PANEL:   CBC  Recent Labs Lab 01/31/16 1122  WBC 69.9*  HGB 11.0*  HCT 34.4*  PLT 174   ------------------------------------------------------------------------------------------------------------------  Chemistries   Recent Labs Lab 01/31/16 1122  NA 132*  K 4.4  CL 101  CO2 19*  GLUCOSE 123*  BUN 13  CREATININE 0.83  CALCIUM 8.2*  AST 38  ALT 50  ALKPHOS 530*  BILITOT 1.0   ------------------------------------------------------------------------------------------------------------------  Cardiac Enzymes No results for input(s): TROPONINI in the last 168 hours. ------------------------------------------------------------------------------------------------------------------  RADIOLOGY:  Dg Chest 2 View  01/31/2016  CLINICAL DATA:  History of metastatic lung adenocarcinoma. Fatigue today. EXAM: CHEST  2 VIEW COMPARISON:  Single-view of the chest 01/12/2016. PA and lateral chest 01/11/2016. CT chest 12/25/2015. FINDINGS: Heart size is normal. Masslike opacity in the left lung apex is again seen. Small left pleural effusion is again seen. Osseous metastatic disease is again identified with scattered sclerotic lesions and expansion of the right seventh rib again seen. IMPRESSION: No acute abnormality. Small left pleural effusion. No change in left apical opacity and multifocal osseous metastases. Electronically Signed  By: Inge Rise M.D.   On: 01/31/2016 12:35    EKG:   Orders placed or performed in visit on 01/31/16  . EKG 12-Lead    IMPRESSION AND PLAN:   Luis [name]  is a 50 y.o. 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  #1 left lower extremity weakness-MRI of the T-spine has been ordered in the emergency room. If it does not show progression, he will need an MRI of the brain and lumbar spine as well to rule out worsening of his metastatic disease. -Started on Decadron at this time. -Pain control and palliative care consult.  #2 metastatic adenocarcinoma of the lung with bone metastases-progressive disease, and able to tolerate chemotherapy. -Oncology consult ordered. Poor prognosis. -Palliative care consult  #3 worsening leukocytosis-could be secondary to his malignancy (paraneoplastic?). No source of infection noted. -Hasn't received any new last as outpatient. -Hold off on antibiotics. Management per oncology.  Number for constipation-laxatives  #5 DVT prophylaxis-Ted's and SCDs. Risk of bleeding due to brain metastases     All the records are reviewed and case discussed with ED provider. Management plans discussed with the patient, family and they are in agreement.  CODE STATUS: Full Code  TOTAL TIME TAKING CARE OF THIS PATIENT: 50 minutes.    Janielle Mittelstadt M.D on 01/31/2016 at 5:55 PM  Between 7am to 6pm - Pager - [phone]  After 6pm go to [email or web address] - password [email or web address] [email or web address]  Office  [phone]  CC: Primary care physician; PROVIDER NOT IN SYSTEM

## 2016-01-31 NOTE — ED Provider Notes (Signed)
[facility] Emergency Department Provider Note   ____________________________________________  Time seen: Approximately 11:39 AM  I have reviewed the triage vital signs and the nursing notes.   HISTORY  Chief Complaint Weakness    HPI Luis [name] is a 50 y.o. male who has stage IV lung cancer with history of spinal metastasis. Patient reports increasing weakness says he can't walk. Patient also complains of some low back pain and pain in his left leg. Patient's left leg is weak. Patient is tachycardic and has low-grade fever. Patient came by private car for the cancer center. Patient's blood work included a white count of 74,000 with multiple eosinophils. Patient has been getting weak for some time but is now very weak and again cannot walk. Patient denies any chest or abdominal pain is not coughing and does not have a sore throat has no other complaints.   Past Medical History  Diagnosis Date  . HTN (hypertension)   . Brain lesion   . 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 History of radiation therapy   . Adenocarcinoma of lung ([place]) 12/05/2014  . Lung cancer [facility]     metastatic to     Patient Active Problem List   Diagnosis Date Noted  . Dehydration 01/24/2016  . Primary malignant neoplasm of left upper lobe of lung ([place]) 01/21/2016  . Diarrhea   . Malnutrition of moderate degree 01/12/2016  . Sepsis ([place]) 01/11/2016  . Encounter for antineoplastic chemotherapy 12/28/2015  . Metastatic bone cancer ([place]) 12/28/2015  . Brain metastases ([place]) 12/28/2015  . Neoplasm related pain 12/28/2015  . Upper GI bleed 09/30/2015  . Acute blood loss anemia 09/30/2015  . Epigastric pain 09/30/2015  . GI bleed 09/30/2015  . Left hand weakness 09/10/2015  . Spinal cord tumor ([place]) 03/08/2014  . Essential hypertension, benign 03/08/2014  . Weakness 03/06/2014    Past Surgical History  Procedure Laterality Date  .  Laminectomy N/A 03/07/2014    Procedure: T1-T2 Laminectomy with Decompression of Cord and Removal of Cancer  thoracic one/two;  Surgeon: Kristeen Miss, MD;  Location: [place] [facility]UR[facility] ORS;  Service: Neurosurgery;  Laterality: N/A;  . Ct guided biopsy  ([facility] hx)  05/28/2014  . Esophagogastroduodenoscopy N/A 10/01/2015    Procedure: ESOPHAGOGASTRODUODENOSCOPY (EGD);  Surgeon: Hulen Luster, MD;  Location: [facility] ENDOSCOPY;  Service: Endoscopy;  Laterality: N/A;    Current Outpatient Rx  Name  Route  Sig  Dispense  Refill  . calcium-vitamin D (OSCAL WITH D) 500-200 MG-UNIT tablet   Oral   Take 1 tablet by mouth 2 (two) times daily.   60 tablet   3   . feeding supplement, ENSURE ENLIVE, (ENSURE ENLIVE) LIQD   Oral   Take 237 mLs by mouth 3 (three) times daily between meals.   381 mL   12   . folic acid (FOLVITE) 1 MG tablet   Oral   Take 1 tablet (1 mg total) by mouth daily.   90 tablet   1   . loperamide (IMODIUM) 2 MG capsule   Oral   Take 1 capsule (2 mg total) by mouth as needed for diarrhea or loose stools.   30 capsule   0   . ondansetron (ZOFRAN) 8 MG tablet   Oral   Take 1 tablet (8 mg total) by mouth every 8 (eight) hours as needed for nausea or vomiting.   20 tablet   0   .  Oxycodone HCl 10 MG TABS   Oral   Take 10 mg by mouth every 8 (eight) hours as needed (for severe pain).         . predniSONE (DELTASONE) 10 MG tablet   Oral   Take 4 tablets (40 mg total) by mouth daily with breakfast.   50 tablet   0   . prochlorperazine (COMPAZINE) 10 MG tablet   Oral   Take 1 tablet (10 mg total) by mouth every 6 (six) hours as needed for nausea or vomiting.   30 tablet   0     Allergies Review of patient's allergies indicates no known allergies.  Family History  Problem Relation Age of Onset  . Colon cancer Mother   . Heart disease Father   . Hypertension Sister   . Hypertension Brother     Social History Social History  Substance Use Topics  . Smoking status:  Never Smoker   . Smokeless tobacco: Never Used  . Alcohol Use: 0.5 oz/week    1 Standard drinks or equivalent per week     Comment: none for a year    Review of Systems Constitutional: No fever/chills Eyes: No visual changes. ENT: No sore throat. Cardiovascular: Denies chest pain. Respiratory: Denies shortness of breath. Gastrointestinal: No abdominal pain.  No nausea, no vomiting.  No diarrhea.  No constipation. Genitourinary: Negative for dysuria. Musculoskeletal: Negative for back pain. Skin: Negative for rash. Neurological: Negative for headaches, see history of present illness  10-point ROS otherwise negative.  ____________________________________________   PHYSICAL EXAM:  VITAL SIGNS: ED Triage Vitals  Enc Vitals Group     BP 01/31/16 1053 111/79 mmHg     Pulse Rate 01/31/16 1053 127     Resp 01/31/16 1053 20     Temp 01/31/16 1053 99 F (37.2 C)     Temp Source 01/31/16 1053 Oral     SpO2 01/31/16 1053 98 %     Weight 01/31/16 1128 135 lb 9.6 oz (61.508 kg)     Height --      Head Cir --      Peak Flow --      Pain Score 01/31/16 1053 0     Pain Loc --      Pain Edu? --      Excl. in [place]? --     Constitutional: Alert and oriented. Well appearing And looks weak Eyes: Conjunctivae are normal. PERRL. EOMI. Head: Atraumatic. Nose: No congestion/rhinnorhea. Mouth/Throat: Mucous membranes are moist.  Oropharynx non-erythematous. Neck: No stridor.  Cardiovascular: Normal rate, regular rhythm. Grossly normal heart sounds.  Good peripheral circulation. Respiratory: Normal respiratory effort.  No retractions. Lungs CTAB. Gastrointestinal: Soft and nontender. No distention. No abdominal bruits. No CVA tenderness. Musculoskeletal: No lower extremity tenderness nor edema.  No joint effusions. Patient does seem to have a small amount of back pain on palpation of the low back. Neurologic:  Normal speech and language. No gross focal neurologic deficits are appreciated  except for diffuse weakness in the left leg.  Skin:  Skin is warm, dry and intact. No rash noted. Psychiatric: Mood and affect are normal. Speech and behavior are normal.  ____________________________________________   LABS (all labs ordered are listed, but only abnormal results are displayed)  Labs Reviewed  COMPREHENSIVE METABOLIC PANEL - Abnormal; Notable for the following:    Sodium 132 (*)    CO2 19 (*)    Glucose, Bld 123 (*)    Calcium 8.2 (*)  Albumin 2.2 (*)    Alkaline Phosphatase 530 (*)    All other components within normal limits  LACTIC ACID, PLASMA - Abnormal; Notable for the following:    Lactic Acid, Venous 2.9 (*)    All other components within normal limits  CBC WITH DIFFERENTIAL/PLATELET - Abnormal; Notable for the following:    WBC 69.9 (*)    Hemoglobin 11.0 (*)    HCT 34.4 (*)    MCV 71.3 (*)    MCH 22.7 (*)    MCHC 31.8 (*)    RDW 30.2 (*)    Neutro Abs 42.6 (*)    Monocytes Absolute 0.0 (*)    Eosinophils Absolute 24.5 (*)    All other components within normal limits  URINALYSIS COMPLETEWITH MICROSCOPIC ([facility] ONLY) - Abnormal; Notable for the following:    Color, Urine AMBER (*)    APPearance HAZY (*)    Hgb urine dipstick 1+ (*)    Protein, ur 30 (*)    All other components within normal limits  CULTURE, BLOOD (ROUTINE X 2)  CULTURE, BLOOD (ROUTINE X 2)  URINE CULTURE  LACTIC ACID, PLASMA   ____________________________________________  EKG  EKG read and interpreted by me shows apparent right axis. Thinks is limb lead reversal tachycardia at a rate of 119 no acute ST-T wave changes ____________________________________________  RADIOLOGY  CLINICAL DATA: History of metastatic lung adenocarcinoma. Fatigue today.  EXAM: CHEST 2 VIEW  COMPARISON: Single-view of the chest 01/12/2016. PA and lateral chest 01/11/2016. CT chest 12/25/2015.  FINDINGS: Heart size is normal. Masslike opacity in the left lung apex is again seen.  Small left pleural effusion is again seen. Osseous metastatic disease is again identified with scattered sclerotic lesions and expansion of the right seventh rib again seen.  IMPRESSION: No acute abnormality.  Small left pleural effusion.  No change in left apical opacity and multifocal osseous metastases.   Electronically Signed  By: Inge Rise M.D.  On: 01/31/2016 12:35  ____________________________________________   PROCEDURES    Procedures  Critical Care performed: Critical care time half an hour's involved multiple phone calls to the cancer docs and the radiologist and the hospitalist.  ____________________________________________   INITIAL IMPRESSION / Elias-Fela Solis / ED COURSE  Pertinent labs & imaging results that were available during my care of the patient were reviewed by me and considered in my medical decision making (see chart for details).  Discussed with Dr. Zane Herald day in the cancer center. Since his patient. He says that the white count has been elevated for some time with a lot of eosinophils for some time he thinks it's a paraneoplastic syndrome. We will give him some Zosyn anyway. We will also give Dr. Zane Herald days advice from 4 mg of dexamethasone 3 times a day for the known history of thoracic spine tumor and that low back pain and leg weakness in an MRI of the T and L-spine. Plan on admitting the patient Dr. Zane Herald they will see the patient I will give him fluids as well  ----------------------------------------- 7:01 PM on 01/31/2016 -----------------------------------------  I read the MRI report to both the oncologist Dr. Mike Gip and Dr. Tammy Sours any the hospitalist. They recommend putting him in the hospital and getting palliative radiation tomorrow. ____________________________________________   FINAL CLINICAL IMPRESSION(S) / ED DIAGNOSES  Final diagnoses:  Weakness  Weakness      NEW MEDICATIONS STARTED DURING THIS  VISIT:  New Prescriptions   No medications on file     Note:  This document was prepared using Dragon voice recognition software and may include unintentional dictation errors.    Nena Polio, MD 01/31/16 [phone]

## 2016-01-31 NOTE — Progress Notes (Signed)
Patient returns to clinic for IV fluids today. Patient very weak, complains of nausea, diaphoretic, tachycardic  # Currently on palliative chemotherapy with- carboplatin and Alimta every 3 weeks. Tolerating chemotherapy with moderate-severe side effects [C discussion below]  # Dr B discussion with the patient regarding- the difficult situation given the aggressive progression/his poor tolerance to therapy. Discussed also hospice if he continues to have continued poor tolerance to therapy.   # Poor appetite on steroids- recommend tapering the steroids down to 40 mg once a day x 10days  # Leucocytosis likely paraneoplastic/steroids.   # anemia- sec to malignancy  # diarrhea from chemo- on lomotil as Needed  Sent patient to ER.

## 2016-01-31 NOTE — Consult Note (Signed)
[facility] CONSULT NOTE  Patient Care Team: Provider Not In System as PCP - General  CHIEF COMPLAINTS/PURPOSE OF CONSULTATION:  Metastatic lung cancer/lower extremity weakness  HISTORY OF PRESENTING ILLNESS:  Luis [name] 50 y.o.  male is a very pleasant but unfortunate patient male patient with a history of metastatic adenocarcinoma of the lung/EGFR mutated- most recently progressed on Osimertinib- currently status post 2 cycles of carboplatin and Alimta- last treatment approximately 1 week ago.   Patient was seen in the office- for lower extremity weakness/fevers/over all feeling poorly. He was tachycardic/febrile. Patient was in the emergency room- given the lower extremity weakness he had a MRI of the thoracic and lumbar spine that showed- Progression of the disease involving the spine/ concerns for cord compression. Patient was also noted to have significant leukocytosis- 5-70,000.  Patient was seen in the emergency room; accompanied by his wife. Complains of poor appetite. Positive for weight loss. Positive for shortness of breath positive for cough.   ROS: Positive for nausea and vomiting. Positive for diarrhea. Positive for stool incontinence. A complete 10 point review of system is done which is negative except mentioned above in history of present illness  MEDICAL HISTORY:  Past Medical History  Diagnosis Date  . HTN (hypertension)   . Brain lesion   . 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 History of radiation therapy   . Adenocarcinoma of lung ([place]) 12/05/2014  . Lung cancer ([place])     metastatic to     SURGICAL HISTORY: Past Surgical History  Procedure Laterality Date  . Laminectomy N/A 03/07/2014    Procedure: T1-T2 Laminectomy with Decompression of Cord and Removal of Cancer  thoracic one/two;  Surgeon: Kristeen Miss, MD;  Location: [facility] NEURO ORS;  Service: Neurosurgery;  Laterality: N/A;  . Ct guided biopsy  ([facility] hx)   05/28/2014  . Esophagogastroduodenoscopy N/A 10/01/2015    Procedure: ESOPHAGOGASTRODUODENOSCOPY (EGD);  Surgeon: Hulen Luster, MD;  Location[facility] END[facility]COPY;  Service: Endoscopy;  Laterality: N/A;    SOCIAL HISTORY: Social History   Social History  . Marital Status: Married    Spouse Name: N/A  . Number of Children: N/A  . Years of Education: N/A   Occupational History  . Not on file.   Social History Main Topics  . Smoking status: Never Smoker   . Smokeless tobacco: Never Used  . Alcohol Use: 0.5 oz/week    1 Standard drinks or equivalent per week     Comment: none for a year  . Drug Use: No  . Sexual Activity: Yes   Other Topics Concern  . Not on file   Social History Narrative   Lives at home with wife, has a walker at home    FAMILY HISTORY: Family History  Problem Relation Age of Onset  . Colon cancer Mother   . Heart disease Father   . Hypertension Sister   . Hypertension Brother     ALLERGIES:  has No Known Allergies.  MEDICATIONS:  Current Facility-Administered Medications  Medication Dose Route Frequency Provider Last Rate Last Dose  . dexamethasone (DECADRON) injection 4 mg  4 mg Intravenous Q8H Nena Polio, MD   4 mg at 01/31/16 1219   Current Outpatient Prescriptions  Medication Sig Dispense Refill  . calcium-vitamin D (OSCAL WITH D) 500-200 MG-UNIT tablet Take 1 tablet by mouth 2 (two) times daily. 60 tablet 3  . feeding supplement, ENSURE ENLIVE, (ENSURE  ENLIVE) LIQD Take 237 mLs by mouth 3 (three) times daily between meals. 179 mL 12  . folic acid (FOLVITE) 1 MG tablet Take 1 tablet (1 mg total) by mouth daily. 90 tablet 1  . loperamide (IMODIUM) 2 MG capsule Take 1 capsule (2 mg total) by mouth as needed for diarrhea or loose stools. 30 capsule 0  . ondansetron (ZOFRAN) 8 MG tablet Take 1 tablet (8 mg total) by mouth every 8 (eight) hours as needed for nausea or vomiting. 20 tablet 0  . Oxycodone HCl 10 MG TABS Take 10 mg by mouth every 8  (eight) hours as needed (for severe pain).    . predniSONE (DELTASONE) 10 MG tablet Take 4 tablets (40 mg total) by mouth daily with breakfast. 50 tablet 0  . prochlorperazine (COMPAZINE) 10 MG tablet Take 1 tablet (10 mg total) by mouth every 6 (six) hours as needed for nausea or vomiting. 30 tablet 0      .  PHYSICAL EXAMINATION:  Filed Vitals:   01/31/16 1809 01/31/16 1850  BP: 113/82 112/80  Pulse: 103 100  Temp:    Resp: 22 22   Filed Weights   01/31/16 1128  Weight: 135 lb 9.6 oz (61.508 kg)    GENERAL: Cachectic appearing Asian male patient   With his wife. EYES: no pallor or icterus OROPHARYNX: no thrush or ulceration. NECK: supple, no masses felt LYMPH:  no palpable lymphadenopathy in the cervical, axillary or inguinal regions LUNGS: decreased breath sounds to auscultation at bases and  No wheeze or crackles HEART/CVS: regular rate & rhythm and no murmurs; No lower extremity edema ABDOMEN: abdomen soft, non-tender and normal bowel sounds Musculoskeletal:no cyanosis of digits and no clubbing  PSYCH: alert & oriented x 3 with fluent speech NEURO: Decreased strength bilateral lower extremities.  SKIN:  no rashes or significant lesions  LABORATORY DATA:  I have reviewed the data as listed Lab Results  Component Value Date   WBC 69.9* 01/31/2016   HGB 11.0* 01/31/2016   HCT 34.4* 01/31/2016   MCV 71.3* 01/31/2016   PLT 174 01/31/2016    Recent Labs  02/28/15 1445  06/11/15 1531 06/19/15 1409  01/21/16 0930 01/31/16 0945 01/31/16 1122  NA 135  < >  --   --   < > 130* 126* 132*  K 3.2*  < >  --   --   < > 3.8 4.4 4.4  CL 104  < >  --   --   < > 103 100* 101  CO2 25  < >  --   --   < > 20* 18* 19*  GLUCOSE 133*  < >  --   --   < > 121* 127* 123*  BUN 12  < >  --   --   < > 18 13 13   CREATININE 1.01  < >  --   --   < > 0.58* 0.87 0.83  CALCIUM 8.3*  < >  --   --   < > 7.5* 7.8* 8.2*  GFRNONAA >60  < >  --   --   < > >60 >60 >60  GFRAA >60  < >  --   --    < > >60 >60 >60  PROT 8.0  < > 8.7* 8.1  < > 6.8 6.9 6.8  ALBUMIN 3.9  < > 3.3* 3.2*  < > 2.4* 2.3* 2.2*  AST 46*  < > 183* 29  < >  31 40 38  ALT 64*  < > 225* 69*  < > 78* 50 50  ALKPHOS 79  < > 222* 163*  < > 407* 532* 530*  BILITOT 0.5  < > 0.9 0.3  < > 0.4 1.0 1.0  BILIDIR <0.1*  --  0.3 <0.1*  --   --   --   --   IBILI NOT CALCULATED  --  0.6 NOT CALCULATED  --   --   --   --   < > = values in this interval not displayed.  RADIOGRAPHIC STUDIES: I have personally reviewed the radiological images as listed and agreed with the findings in the report. Dg Chest 1 View  01/12/2016  CLINICAL DATA:  Metastatic lung adenocarcinoma on chemotherapy. Shortness of breath. EXAM: CHEST 1 VIEW COMPARISON:  Chest radiograph from one day prior. FINDINGS: Stable cardiomediastinal silhouette with normal heart size. No pneumothorax. No right pleural effusion. Stable blunting of the left costophrenic angle, favor a small left pleural effusion. No pulmonary edema. Stable consolidation in the apical left upper lobe. Stable patchy left lung base opacity. Stable expansile sclerotic right posterior seventh rib metastasis. Stable sclerotic foci throughout the thoracic spine and medial left ribs. IMPRESSION: 1. Stable small left pleural effusion. 2. Stable consolidation in the apical left upper lobe, which could represent posttreatment change and/or tumor. 3. Stable patchy left lung base opacity, either atelectasis or pneumonia. 4. Stable multifocal sclerotic osseous metastases in the medial ribs and thoracic spine. Electronically Signed   By: Ilona Sorrel M.D.   On: 01/12/2016 10:56   Dg Chest 2 View  01/31/2016  CLINICAL DATA:  History of metastatic lung adenocarcinoma. Fatigue today. EXAM: CHEST  2 VIEW COMPARISON:  Single-view of the chest 01/12/2016. PA and lateral chest 01/11/2016. CT chest 12/25/2015. FINDINGS: Heart size is normal. Masslike opacity in the left lung apex is again seen. Small left pleural effusion is  again seen. Osseous metastatic disease is again identified with scattered sclerotic lesions and expansion of the right seventh rib again seen. IMPRESSION: No acute abnormality. Small left pleural effusion. No change in left apical opacity and multifocal osseous metastases. Electronically Signed   By: Inge Rise M.D.   On: 01/31/2016 12:35   Dg Chest 2 View  01/11/2016  CLINICAL DATA:  50 year old male with history of metastatic lung cancer presenting with weakness and possible sepsis EXAM: CHEST  2 VIEW COMPARISON:  Chest CT dated 12/25/2015 FINDINGS: Two views of the chest demonstrate focal area of consolidative change in the left apical region medially. This may represent an area of postradiation, however pneumonia or underlying mass is not excluded. There is a small left pleural effusion, likely malignant. The right lung is clear. There is no pneumothorax. The cardiac silhouette is within normal limits. There are multiple osseous sclerotic lesions. There is expansile sclerotic lesion involving the right posterior seventh rib as well as multiple left ribs compatible with metastatic disease. No acute fracture identified. IMPRESSION: Consolidative change in the left apical region as seen on the prior CT. No new consolidation Small left pleural effusion. Osseous metastatic disease Electronically Signed   By: Anner Crete M.D.   On: 01/11/2016 19:57   Mr Thoracic Spine W Wo Contrast  01/31/2016  CLINICAL DATA:  50 year old male with lung cancer and known osseous metastatic disease. Bilateral leg numbness. Unable to walk. Subsequent encounter. EXAM: MRI THORACIC AND LUMBAR SPINE WITHOUT AND WITH CONTRAST TECHNIQUE: Multiplanar and multiecho pulse sequences of the thoracic and  lumbar spine were obtained without and with intravenous contrast. CONTRAST:  57m MULTIHANCE GADOBENATE DIMEGLUMINE 529 MG/ML IV SOLN COMPARISON:  12/25/2015 CT of the chest, abdomen pelvis. MR thoracic and cervical spine  10/30/2015. MR of the lumbar spine 05/20/2014. FINDINGS: MRI THORACIC SPINE FINDINGS On the sagittal sequence obtained for proper level assignment which includes the cervical spine, there has been progression of diffuse osseous metastatic disease throughout the cervical spine when compared to the 10/30/2015 cervical spine MR. Most notable change is metastatic involvement of C1, C2, C6 and C7. Spread of tumor into the C6-7 and C7-T1 neural foramen and into surrounding paraspinal muscles greater on the left. Epidural extension of tumor greater on the left incompletely assessed on the current exam Diffuse metastatic disease throughout the thoracic spine. T2 compression fracture most notable centrally with 85% loss height centrally. Mild retropulsion of the compressed vertebra contributing to narrowing of the ventral aspect of the thecal sac greater on the left with minimal cord contact similar to prior exam. T5 compression fracture with 80% loss height centrally. Mild retropulsion posterior aspect and narrowing of the ventral thecal sac with minimal cord contact. Similar to prior exam. Bony expansion from metastatic involvement of left T5 pedicle with slight flattening of the left aspect of the thecal sac. Epidural tumor posterior to the T9 vertebral body with narrowing ventral thecal sac and mild cord contact. Minimal epidural tumor left paracentral position T7 and bilaterally T8 new from prior exam. 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 Large lung lesion and adrenal and liver metastatic disease. MRI LUMBAR SPINE FINDINGS When compared to the 07/20/2013 lumbar spine MR, there has been marked progression of metastatic involvement now noted involving will be lumbar vertebra, sacrum and visualized aspect of the ilium. Chronic anterior wedge compression fracture of the L2 vertebral body involving superior endplate with 790%loss height anteriorly. Slight retropulsion posterior superior  aspect with slight flattening of the ventral thecal sac. Conus upper L2 level. New epidural extension of tumor posterior to the L1 vertebral body with narrowing of the ventral aspect of thecal sac crowding the ventral nerve roots and conus which is not significantly compressed. Epidural tumor on the left at the L3 level extends from the medial aspect of the left pedicle into left neural foramen with narrowing of the left lateral aspect of the thecal sac and compressing the left L3 nerve root. Large amount of epidural tumor L4 level fills majority of the canal with moderate to marked thecal sac narrowing. Epidural tumor extends into the neural foramen bilaterally and into the left psoas muscle as well as bordering the right psoas muscle. Marked mass effect upon the exiting left L4 nerve root and mild mass effect upon the exiting right L4 nerve root. Epidural tumor extends below the L4-5 disc space in a left paracentral position filling the left lateral recess with mass effect upon the left L5 nerve root with tumor seen to the L5 pedicle level. Epidural tumor borders the left S1 and S2 foramen. IMPRESSION: MRI THORACIC SPINE On the sagittal sequence obtained for proper level assignment which includes the cervical spine, there has been progression of diffuse osseous metastatic disease throughout the cervical spine when compared to the 10/30/2015 cervical spine MR. Most notable change is metastatic involvement of C1, C2, C6 and C7. Spread of tumor into the C6-7 and C7-T1 neural foramen and into surrounding paraspinal muscles greater on the left. Epidural extension of tumor greater on the left incompletely assessed on the  current exam. Diffuse metastatic disease throughout the thoracic spine. T2 compression fracture with 85% loss height centrally. Mild retropulsion of the compressed vertebra contributing to narrowing of the ventral aspect of the thecal sac greater on the left with minimal cord contact similar to prior  exam. T5 compression fracture with 80% loss height centrally. Mild retropulsion posterior aspect and narrowing of the ventral thecal sac with minimal cord contact. Similar to prior exam. Bony expansion from metastatic involvement of left T5 pedicle with slight flattening of the left aspect of the thecal sac. Epidural tumor posterior to the T9 vertebral body with narrowing ventral thecal sac and mild cord contact. Minimal epidural tumor left paracentral position T7 and bilaterally T8 new from prior exam. No high-grade compression of the thoracic cord. 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 Large lung lesion and adrenal and liver metastatic disease. MRI LUMBAR SPINE When compared to the 07/20/2013 lumbar spine MR, there has been marked progression of metastatic involvement now noted involving will be lumbar vertebra, sacrum and visualized aspect of the ilium. Large amount of epidural tumor L4 level fills majority of the canal with moderate to marked thecal sac narrowing. Epidural tumor extends into the neural foramen bilaterally and into the left psoas muscle as well as bordering the right psoas muscle. Marked mass effect upon the exiting left L4 nerve root and mild mass effect upon the exiting right L4 nerve root. Epidural tumor extends below the L4-5 disc space in a left paracentral position filling the left lateral recess with mass effect upon the left L5 nerve root with tumor seen to the L5 pedicle level. Epidural tumor on the left at the L3 level extends from the medial aspect of the left pedicle into left neural foramen with narrowing of the left lateral aspect of the thecal sac and compressing the left L3 nerve root. Epidural tumor posterior to the L1 vertebral body with narrowing of the ventral aspect of thecal sac crowding the ventral nerve roots and conus which is not significantly compressed. Chronic L2 compression fracture with mild retropulsion posterior superior aspect.  Please see above for further detail. These results were called by telephone at the time of interpretation on 01/31/2016 at 6:31 pm to Dr. Conni Slipper , who verbally acknowledged these results. Electronically Signed   By: Genia Del M.D.   On: 01/31/2016 18:29   Mr Lumbar Spine W Wo Contrast  01/31/2016  CLINICAL DATA:  50 year old male with lung cancer and known osseous metastatic disease. Bilateral leg numbness. Unable to walk. Subsequent encounter. EXAM: MRI THORACIC AND LUMBAR SPINE WITHOUT AND WITH CONTRAST TECHNIQUE: Multiplanar and multiecho pulse sequences of the thoracic and lumbar spine were obtained without and with intravenous contrast. CONTRAST:  54m MULTIHANCE GADOBENATE DIMEGLUMINE 529 MG/ML IV SOLN COMPARISON:  12/25/2015 CT of the chest, abdomen pelvis. MR thoracic and cervical spine 10/30/2015. MR of the lumbar spine 05/20/2014. FINDINGS: MRI THORACIC SPINE FINDINGS On the sagittal sequence obtained for proper level assignment which includes the cervical spine, there has been progression of diffuse osseous metastatic disease throughout the cervical spine when compared to the 10/30/2015 cervical spine MR. Most notable change is metastatic involvement of C1, C2, C6 and C7. Spread of tumor into the C6-7 and C7-T1 neural foramen and into surrounding paraspinal muscles greater on the left. Epidural extension of tumor greater on the left incompletely assessed on the current exam Diffuse metastatic disease throughout the thoracic spine. T2 compression fracture most notable centrally with 85% loss  height centrally. Mild retropulsion of the compressed vertebra contributing to narrowing of the ventral aspect of the thecal sac greater on the left with minimal cord contact similar to prior exam. T5 compression fracture with 80% loss height centrally. Mild retropulsion posterior aspect and narrowing of the ventral thecal sac with minimal cord contact. Similar to prior exam. Bony expansion from metastatic  involvement of left T5 pedicle with slight flattening of the left aspect of the thecal sac. Epidural tumor posterior to the T9 vertebral body with narrowing ventral thecal sac and mild cord contact. Minimal epidural tumor left paracentral position T7 and bilaterally T8 new from prior exam. 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 Large lung lesion and adrenal and liver metastatic disease. MRI LUMBAR SPINE FINDINGS When compared to the 07/20/2013 lumbar spine MR, there has been marked progression of metastatic involvement now noted involving will be lumbar vertebra, sacrum and visualized aspect of the ilium. Chronic anterior wedge compression fracture of the L2 vertebral body involving superior endplate with 67% loss height anteriorly. Slight retropulsion posterior superior aspect with slight flattening of the ventral thecal sac. Conus upper L2 level. New epidural extension of tumor posterior to the L1 vertebral body with narrowing of the ventral aspect of thecal sac crowding the ventral nerve roots and conus which is not significantly compressed. Epidural tumor on the left at the L3 level extends from the medial aspect of the left pedicle into left neural foramen with narrowing of the left lateral aspect of the thecal sac and compressing the left L3 nerve root. Large amount of epidural tumor L4 level fills majority of the canal with moderate to marked thecal sac narrowing. Epidural tumor extends into the neural foramen bilaterally and into the left psoas muscle as well as bordering the right psoas muscle. Marked mass effect upon the exiting left L4 nerve root and mild mass effect upon the exiting right L4 nerve root. Epidural tumor extends below the L4-5 disc space in a left paracentral position filling the left lateral recess with mass effect upon the left L5 nerve root with tumor seen to the L5 pedicle level. Epidural tumor borders the left S1 and S2 foramen. IMPRESSION: MRI THORACIC  SPINE On the sagittal sequence obtained for proper level assignment which includes the cervical spine, there has been progression of diffuse osseous metastatic disease throughout the cervical spine when compared to the 10/30/2015 cervical spine MR. Most notable change is metastatic involvement of C1, C2, C6 and C7. Spread of tumor into the C6-7 and C7-T1 neural foramen and into surrounding paraspinal muscles greater on the left. Epidural extension of tumor greater on the left incompletely assessed on the current exam. Diffuse metastatic disease throughout the thoracic spine. T2 compression fracture with 85% loss height centrally. Mild retropulsion of the compressed vertebra contributing to narrowing of the ventral aspect of the thecal sac greater on the left with minimal cord contact similar to prior exam. T5 compression fracture with 80% loss height centrally. Mild retropulsion posterior aspect and narrowing of the ventral thecal sac with minimal cord contact. Similar to prior exam. Bony expansion from metastatic involvement of left T5 pedicle with slight flattening of the left aspect of the thecal sac. Epidural tumor posterior to the T9 vertebral body with narrowing ventral thecal sac and mild cord contact. Minimal epidural tumor left paracentral position T7 and bilaterally T8 new from prior exam. No high-grade compression of the thoracic cord. 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  Large lung lesion and adrenal and liver metastatic disease. MRI LUMBAR SPINE When compared to the 07/20/2013 lumbar spine MR, there has been marked progression of metastatic involvement now noted involving will be lumbar vertebra, sacrum and visualized aspect of the ilium. Large amount of epidural tumor L4 level fills majority of the canal with moderate to marked thecal sac narrowing. Epidural tumor extends into the neural foramen bilaterally and into the left psoas muscle as well as bordering the right  psoas muscle. Marked mass effect upon the exiting left L4 nerve root and mild mass effect upon the exiting right L4 nerve root. Epidural tumor extends below the L4-5 disc space in a left paracentral position filling the left lateral recess with mass effect upon the left L5 nerve root with tumor seen to the L5 pedicle level. Epidural tumor on the left at the L3 level extends from the medial aspect of the left pedicle into left neural foramen with narrowing of the left lateral aspect of the thecal sac and compressing the left L3 nerve root. Epidural tumor posterior to the L1 vertebral body with narrowing of the ventral aspect of thecal sac crowding the ventral nerve roots and conus which is not significantly compressed. Chronic L2 compression fracture with mild retropulsion posterior superior aspect. Please see above for further detail. These results were called by telephone at the time of interpretation on 01/31/2016 at 6:31 pm to Dr. Conni Slipper , who verbally acknowledged these results. Electronically Signed   By: Genia Del M.D.   On: 01/31/2016 18:29    ASSESSMENT & PLAN:   # 50 year old male patient- EGFR mutated- currently on third line chemotherapy with carboplatin and Alimta post 2 cycles- currently admitted to the hospital for lower extremity weakness/ poor feeling poorly.  # Lower extremity weakness- likely secondary to disease progression of the disease- cord compression noted at thoracic vertebra- multiple levels; also progression of disease noted lumbar region.   # Patient Has a rather aggressive clinical course- since the last progression on EGFR TKI/osimertinib. Patient had prior radiation to the cervical region/thoracic region. Patient has previous Radiation to the brain.  # Patient has poor tolerance to chemotherapy. Also progressive disease while on chemotherapy. Declining performance status. I would not recommend any further chemotherapy/ any palliative radiation- I do not think is  going to improve his quality of life. I had a long discussion regarding hospice- the patient and his wife. Discuss option of hospice at home versus hospice home. They are not decided/leaning towards hospice patient's home.  # I also discussed with Dr.Kalisetti regarding my recommendations. # I reviewed the blood work- with the patient in detail; also reviewed the imaging independently [as summarized above].   Thank you Dr.Kalisetti for the referral and allowing me to participate in the care of your pleasant patient.     Cammie Sickle, MD 01/31/2016 7:42 PM

## 2016-01-31 NOTE — ED Notes (Addendum)
Pt arrives to ER via POV from [place]. Pt being seen for labwork today and reports fatigue/dehydration. Pt brought to ER by [facility] Staff to ER on stretcher. Pt alert and oriented X4, active, cooperative, pt in NAD. RR even and unlabored, color WNL.

## 2016-02-01 ENCOUNTER — Telehealth: Payer: Self-pay

## 2016-02-01 LAB — URINE CULTURE: Culture: NO GROWTH

## 2016-02-01 LAB — BASIC METABOLIC PANEL
Anion gap: 4 — ABNORMAL LOW (ref 5–15)
BUN: 16 mg/dL (ref 6–20)
CALCIUM: 7.2 mg/dL — AB (ref 8.9–10.3)
CO2: 21 mmol/L — AB (ref 22–32)
CREATININE: 0.62 mg/dL (ref 0.61–1.24)
Chloride: 113 mmol/L — ABNORMAL HIGH (ref 101–111)
GFR calc non Af Amer: >60 mL/min (ref >60)
GLUCOSE: 130 mg/dL — AB (ref 65–99)
Potassium: 4.2 mmol/L (ref 3.5–5.1)
Sodium: 138 mmol/L (ref 135–145)

## 2016-02-01 LAB — CBC
HCT: 30.8 % — ABNORMAL LOW (ref 40.0–52.0)
Hemoglobin: 9.9 g/dL — ABNORMAL LOW (ref 13.0–18.0)
MCH: 23.1 pg — ABNORMAL LOW (ref 26.0–34.0)
MCHC: 32.1 g/dL (ref 32.0–36.0)
MCV: 71.8 fL — ABNORMAL LOW (ref 80.0–100.0)
Platelets: 175 K/uL (ref 150–440)
RBC: 4.29 MIL/uL — ABNORMAL LOW (ref 4.40–5.90)
RDW: 29.9 % — ABNORMAL HIGH (ref 11.5–14.5)
WBC: 54.3 K/uL (ref 3.8–10.6)

## 2016-02-01 MED ORDER — FENTANYL 12 MCG/HR TD PT72: 12.5000 ug | MEDICATED_PATCH | TRANSDERMAL | Status: DC

## 2016-02-01 MED ORDER — FENTANYL 12 MCG/HR TD PT72
12.5000 ug | MEDICATED_PATCH | TRANSDERMAL | Status: DC
  Administered 2016-02-01: 13:00:00 12.5 ug via TRANSDERMAL
  Filled 2016-02-01: qty 1

## 2016-02-01 NOTE — Care Management Note (Signed)
Case Management Note  Patient Details  Name: Luis [name] MRN: [id number] Date of Birth: 1966-02-07  Subjective/Objective:                 Spoke with patient at the bedside. Provided choice for Home Hospice Care. Patient states he would like to use [facility]. Patient currently receives [facility] services from [facility].  Action/Plan:  Referral made to [facility] [phone]  Expected Discharge Date:                  Expected Discharge Plan:  Home w Hospice Care  In-House Referral:     Discharge planning Services  CM Consult  Post Acute Care Choice:    Choice offered to:  Patient  DME Arranged:    DME Agency:     HH Arranged:    [facility] Agency:  [facility]  Status of Service:  Completed, signed off  If discussed at [place] of Stay Meetings, dates discussed:    Additional Comments:  Carles Collet, RN 02/01/2016, 9:43 AM

## 2016-02-01 NOTE — Progress Notes (Addendum)
Visit made. Patient now states he would like a hospital bed and he would like it to be delivered before he goes home. Patient requests patch for pain management. Dr. Verdell Carmine is in patient's room and stated that he will prescribe a Fentanyl Patch for pain management and have the nurse to apply one before patient discharges home today. Patient's son, Myna Bright who states he is 42 and patient's son, Carmelia Bake who is 52 years old are in the room as well as patient's spouse Mya Thin. Hospice folder with information given to family and instructed patient, Mya and Myna Bright how to call our office if they need [place] [phone]. Patient's spouse and sons will be at home awaiting delivery of the hospital bed. Patient's spouse Mya also stated that her mother will be coming into town next Tuesday to assist in caregiving needs for patient. Mya and Myna Bright stated they are from [place] and they often speak in [place]. Myna Bright speaks both [place] and [place]. Updated referral intake at hospice agency, Dedra, RN, Darden Dates, CSW and Carles Collet, Care Manager with [facility]. Will continue to follow. Ebbie Ridge MSN, [employer] [phone]

## 2016-02-01 NOTE — Progress Notes (Addendum)
Visit made. Patient is a new referral for hospice services at home. Patient is currently being served by [facility]. Review of patient notes reveal patient had a discussion with Dr. Rogue Bussing on 01-31-16 that discussed progression of his disease and Dr. Rogue Bussing stated that he would not recommend any further chemotherapy/any palliative radiation and that he had a long discussion regarding hospice with the patient and his wife. The patient spoke with Carles Collet, RN, nurse case manager with [facility] this morning and chose to go home with hospice services from [facility]. Upon my visit, patient was lying in bed, alert and oriented. Patient agreeable to my visit to provide education on hospice services at home. Education provided on hospice services and patient in agreement to go home with hospice services. 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 Patient also has hypertension, and weakness. Patient expresses his wishes for his care as "when I die, I want to be comfortable and I want to be surrounded by family and friends. Even with my condition, I want to be happy." Patient will need a same day hospice admission. Patient has a spouse Mya Thin, two sons, one he says is "67 or 26" and one who is 10 years old. He also states he has support from neighbors and friends in the community who visit him and assist with transportation to appointments. Discussed patient status with his nurse Dedra. Dedra, RN states patient has not complained with pain today and has denied pain medication. Patient described his pain to me today as "getting better, there are good days and bad days." When I asked about the type of pain he has, he stated "weakness, it comes and goes." Dedra, RN stated there are no skin issues or wound care needs at this time. Dedra stated that he  is able to ambulate with assistance. Dedra states patient has two peripheral IV's one in right arm and one in left arm. Patient describes current DME in the home as a wheelchair and walker. Patient declines a home hospital bed at this time. He sleeps in a recliner most days as he finds this most comfortable. Patient may need DME for showering and may also need a transport wheelchair. Home hospice team to follow up on shower and transport chair DME needs upon discharge from [facility]. Patient requests that I do not contact his wife in addition to my conversation with him today. He stated that he will follow up with her and explain to her the plan. He stated that she is aware that hospice will be involved. Patient is DNR and portable DNR is on chart awaiting signature. I updated Darden Dates, CSW and Carles Collet, care manager of patient decision to go home with hospice. Will await discharge orders. I notified referral intake staff with hospice. Will continue to follow. Ebbie Ridge MSN, [facility] Liaison [phone]

## 2016-02-01 NOTE — Discharge Summary (Signed)
[facility] NAME: Luis [name]    MR#:  [id number]  DATE OF BIRTH:  May 22, 1966  DATE OF ADMISSION:  01/31/2016 ADMITTING PHYSICIAN: Gladstone Lighter, MD  DATE OF DISCHARGE: 02/01/2016  PRIMARY CARE PHYSICIAN: PROVIDER NOT IN SYSTEM    ADMISSION DIAGNOSIS:  Weakness [R53.1]  DISCHARGE DIAGNOSIS:  Active Problems:   Weakness   SECONDARY DIAGNOSIS:   Past Medical History  Diagnosis Date  . HTN (hypertension)   . Brain lesion   . 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 History of radiation therapy   . Adenocarcinoma of lung ([place]) 12/05/2014  . Lung cancer Luis [name])     metastatic to     HOSPITAL COURSE:   Luis [name] is a 50 y.o. 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  #1 left lower extremity weakness-This was secondary to metastatic carcinoma. Patient underwent a MRI of the thoracic spine which showed significant metastatic disease. Patient was seen by his oncologist and opted for palliative/hospice care. The oncologist did not think that he would benefit from palliative radiation given the extent of his disease. -While in the hospital patient was given IV Decadron, IV morphine, oral oxycodone. His pain has improved. At present he is being discharged on oral oxycodone, fentanyl patch. He will be followed by hospice at home.  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 Prognosis poor. Patient has opted for hospice services at home which is where he is presently being discharged.  #3 worsening leukocytosis-this is probably stress mediated versus paraneoplastic process secondary to malignancy. -No acute evidence of infection. Patient  was given 1 dose of IV antibiotics in the ER and now not being discharged in a antibiotics.   DISCHARGE CONDITIONS:   Stable  CONSULTS OBTAINED:     DRUG ALLERGIES:  No Known Allergies  DISCHARGE MEDICATIONS:   Current Discharge Medication List    START taking these medications   Details  fentaNYL (DURAGESIC - DOSED MCG/HR) 12 MCG/HR Place 1 patch (12.5 mcg total) onto the skin every 3 (three) days. Qty: 5 patch, Refills: 0      CONTINUE these medications which have NOT CHANGED   Details  calcium-vitamin D (OSCAL WITH D) 500-200 MG-UNIT tablet Take 1 tablet by mouth 2 (two) times daily. Qty: 60 tablet, Refills: 3    feeding supplement, ENSURE ENLIVE, (ENSURE ENLIVE) LIQD Take 237 mLs by mouth 3 (three) times daily between meals. Qty: 237 mL, Refills: 12    folic acid (FOLVITE) 1 MG tablet Take 1 tablet (1 mg total) by mouth daily. Qty: 90 tablet, Refills: 1   Associated Diagnoses: Metastatic primary lung cancer, right (HCC)    loperamide (IMODIUM) 2 MG capsule Take 1 capsule (2 mg total) by mouth as needed for diarrhea or loose stools. Qty: 30 capsule, Refills: 0   Associated Diagnoses: Diarrhea, unspecified type    ondansetron (ZOFRAN) 8 MG tablet Take 1 tablet (8 mg total) by mouth every 8 (eight) hours as needed for nausea or vomiting. Qty: 20 tablet, Refills: 0   Associated Diagnoses: Adenocarcinoma of lung, right ([place]); Chemotherapy induced nausea and vomiting    Oxycodone HCl 10 MG TABS Take 10 mg by mouth every 8 (eight) hours as  needed (for severe pain).    predniSONE (DELTASONE) 10 MG tablet Take 4 tablets (40 mg total) by mouth daily with breakfast. Qty: 50 tablet, Refills: 0    prochlorperazine (COMPAZINE) 10 MG tablet Take 1 tablet (10 mg total) by mouth every 6 (six) hours as needed for nausea or vomiting. Qty: 30 tablet, Refills: 0   Associated Diagnoses: Metastatic primary lung cancer, right ([place])         DISCHARGE INSTRUCTIONS:   DIET:   Regular diet  DISCHARGE CONDITION:  Stable  ACTIVITY:  Activity as tolerated  OXYGEN:  Home Oxygen: No.   Oxygen Delivery: room air  DISCHARGE LOCATION:  Home with Hospice.    If you experience worsening of your admission symptoms, develop shortness of breath, life threatening emergency, suicidal or homicidal thoughts you must seek medical attention immediately by calling 911 or calling your MD immediately  if symptoms less severe.  You Must read complete instructions/literature along with all the possible adverse reactions/side effects for all the Medicines you take and that have been prescribed to you. Take any new Medicines after you have completely understood and accpet all the possible adverse reactions/side effects.   Please note  You were cared for by a hospitalist during your hospital stay. If you have any questions about your discharge medications or the care you received while you were in the hospital after you are discharged, you can call the unit and asked to speak with the hospitalist on call if the hospitalist that took care of you is not available. Once you are discharged, your primary care physician will handle any further medical issues. Please note that NO REFILLS for any discharge medications will be authorized once you are discharged, as it is imperative that you return to your primary care physician (or establish a relationship with a primary care physician if you do not have one) for your aftercare needs so that they can reassess your need for medications and monitor your lab values.     Today   Back pain has improved.  Weakness improved and ambulating with assistance. Family at bedside.   VITAL SIGNS:  Blood pressure 114/71, pulse 101, temperature 97.6 F (36.4 C), temperature source Oral, resp. rate 18, height '5\' 7"'$  (1.702 m), weight 55.021 kg (121 lb 4.8 oz), SpO2 100 %.  I/O:   Intake/Output Summary (Last 24 hours) at 02/01/16 1305 Last data filed at  02/01/16 0900  Gross per 24 hour  Intake   1270 ml  Output    500 ml  Net    770 ml    PHYSICAL EXAMINATION:  GENERAL:  50 y.o.-year-old patient lying in the bed in no acute distress.  EYES: Pupils equal, round, reactive to light and accommodation. No scleral icterus. Extraocular muscles intact.  HEENT: Head atraumatic, normocephalic. Oropharynx and nasopharynx clear.  NECK:  Supple, no jugular venous distention. No thyroid enlargement, no tenderness.  LUNGS: Normal breath sounds bilaterally, no wheezing, rales,rhonchi. No use of accessory muscles of respiration.  CARDIOVASCULAR: S1, S2 normal. No murmurs, rubs, or gallops.  ABDOMEN: Soft, non-tender, non-distended. Bowel sounds present. No organomegaly or mass.  EXTREMITIES: No pedal edema, cyanosis, or clubbing.  NEUROLOGIC: Cranial nerves II through XII are intact. No focal motor or sensory defecits b/l. Globally weak PSYCHIATRIC: The patient is alert and oriented x 3.  SKIN: No obvious rash, lesion, or ulcer.   DATA REVIEW:   CBC  Recent Labs Lab 02/01/16 0525  WBC 54.3*  HGB 9.9*  HCT 30.8*  PLT 175    Chemistries   Recent Labs Lab 01/31/16 1122 02/01/16 0525  NA 132* 138  K 4.4 4.2  CL 101 113*  CO2 19* 21*  GLUCOSE 123* 130*  BUN 13 16  CREATININE 0.83 0.62  CALCIUM 8.2* 7.2*  AST 38  --   ALT 50  --   ALKPHOS 530*  --   BILITOT 1.0  --     Cardiac Enzymes No results for input(s): TROPONINI in the last 168 hours.  Microbiology Results  Results for orders placed or performed during the hospital encounter of 01/31/16  Culture, blood (Routine x 2)     Status: None (Preliminary result)   Collection Time: 01/31/16 11:22 AM  Result Value Ref Range Status   Specimen Description BLOOD LEFT HAND  Final   Special Requests BOTTLES DRAWN AEROBIC AND ANAEROBIC 2CCAERO,2CCANA  Final   Culture NO GROWTH < 12 HOURS  Final   Report Status PENDING  Incomplete  Culture, blood (Routine x 2)     Status: None  (Preliminary result)   Collection Time: 01/31/16 11:22 AM  Result Value Ref Range Status   Specimen Description BLOOD RIGHT WRIST  Final   Special Requests BOTTLES DRAWN AEROBIC AND ANAEROBIC 5CCAERO,6CCANA  Final   Culture NO GROWTH < 24 HOURS  Final   Report Status PENDING  Incomplete  Urine culture     Status: None   Collection Time: 01/31/16 11:28 AM  Result Value Ref Range Status   Specimen Description URINE, RANDOM  Final   Special Requests NONE  Final   Culture NO GROWTH Performed at [facility]   Final   Report Status 02/01/2016 FINAL  Final    RADIOLOGY:  Dg Chest 2 View  01/31/2016  CLINICAL DATA:  History of metastatic lung adenocarcinoma. Fatigue today. EXAM: CHEST  2 VIEW COMPARISON:  Single-view of the chest 01/12/2016. PA and lateral chest 01/11/2016. CT chest 12/25/2015. FINDINGS: Heart size is normal. Masslike opacity in the left lung apex is again seen. Small left pleural effusion is again seen. Osseous metastatic disease is again identified with scattered sclerotic lesions and expansion of the right seventh rib again seen. IMPRESSION: No acute abnormality. Small left pleural effusion. No change in left apical opacity and multifocal osseous metastases. Electronically Signed   By: Inge Rise M.D.   On: 01/31/2016 12:35   Mr Thoracic Spine W Wo Contrast  01/31/2016  CLINICAL DATA:  50 year old male with lung cancer and known osseous metastatic disease. Bilateral leg numbness. Unable to walk. Subsequent encounter. EXAM: MRI THORACIC AND LUMBAR SPINE WITHOUT AND WITH CONTRAST TECHNIQUE: Multiplanar and multiecho pulse sequences of the thoracic and lumbar spine were obtained without and with intravenous contrast. CONTRAST:  34m MULTIHANCE GADOBENATE DIMEGLUMINE 529 MG/ML IV SOLN COMPARISON:  12/25/2015 CT of the chest, abdomen pelvis. MR thoracic and cervical spine 10/30/2015. MR of the lumbar spine 05/20/2014. FINDINGS: MRI THORACIC SPINE FINDINGS On the sagittal  sequence obtained for proper level assignment which includes the cervical spine, there has been progression of diffuse osseous metastatic disease throughout the cervical spine when compared to the 10/30/2015 cervical spine MR. Most notable change is metastatic involvement of C1, C2, C6 and C7. Spread of tumor into the C6-7 and C7-T1 neural foramen and into surrounding paraspinal muscles greater on the left. Epidural extension of tumor greater on the left incompletely assessed on the current exam Diffuse metastatic disease throughout the thoracic spine. T2 compression fracture most notable centrally  with 85% loss height centrally. Mild retropulsion of the compressed vertebra contributing to narrowing of the ventral aspect of the thecal sac greater on the left with minimal cord contact similar to prior exam. T5 compression fracture with 80% loss height centrally. Mild retropulsion posterior aspect and narrowing of the ventral thecal sac with minimal cord contact. Similar to prior exam. Bony expansion from metastatic involvement of left T5 pedicle with slight flattening of the left aspect of the thecal sac. Epidural tumor posterior to the T9 vertebral body with narrowing ventral thecal sac and mild cord contact. Minimal epidural tumor left paracentral position T7 and bilaterally T8 new from prior exam. 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 Large lung lesion and adrenal and liver metastatic disease. MRI LUMBAR SPINE FINDINGS When compared to the 07/20/2013 lumbar spine MR, there has been marked progression of metastatic involvement now noted involving will be lumbar vertebra, sacrum and visualized aspect of the ilium. Chronic anterior wedge compression fracture of the L2 vertebral body involving superior endplate with 10% loss height anteriorly. Slight retropulsion posterior superior aspect with slight flattening of the ventral thecal sac. Conus upper L2 level. New epidural extension of  tumor posterior to the L1 vertebral body with narrowing of the ventral aspect of thecal sac crowding the ventral nerve roots and conus which is not significantly compressed. Epidural tumor on the left at the L3 level extends from the medial aspect of the left pedicle into left neural foramen with narrowing of the left lateral aspect of the thecal sac and compressing the left L3 nerve root. Large amount of epidural tumor L4 level fills majority of the canal with moderate to marked thecal sac narrowing. Epidural tumor extends into the neural foramen bilaterally and into the left psoas muscle as well as bordering the right psoas muscle. Marked mass effect upon the exiting left L4 nerve root and mild mass effect upon the exiting right L4 nerve root. Epidural tumor extends below the L4-5 disc space in a left paracentral position filling the left lateral recess with mass effect upon the left L5 nerve root with tumor seen to the L5 pedicle level. Epidural tumor borders the left S1 and S2 foramen. IMPRESSION: MRI THORACIC SPINE On the sagittal sequence obtained for proper level assignment which includes the cervical spine, there has been progression of diffuse osseous metastatic disease throughout the cervical spine when compared to the 10/30/2015 cervical spine MR. Most notable change is metastatic involvement of C1, C2, C6 and C7. Spread of tumor into the C6-7 and C7-T1 neural foramen and into surrounding paraspinal muscles greater on the left. Epidural extension of tumor greater on the left incompletely assessed on the current exam. Diffuse metastatic disease throughout the thoracic spine. T2 compression fracture with 85% loss height centrally. Mild retropulsion of the compressed vertebra contributing to narrowing of the ventral aspect of the thecal sac greater on the left with minimal cord contact similar to prior exam. T5 compression fracture with 80% loss height centrally. Mild retropulsion posterior aspect and  narrowing of the ventral thecal sac with minimal cord contact. Similar to prior exam. Bony expansion from metastatic involvement of left T5 pedicle with slight flattening of the left aspect of the thecal sac. Epidural tumor posterior to the T9 vertebral body with narrowing ventral thecal sac and mild cord contact. Minimal epidural tumor left paracentral position T7 and bilaterally T8 new from prior exam. No high-grade compression of the thoracic cord. Metastatic rib lesions largest right seventh rib  with bony expansion. Large lung lesion and adrenal and liver metastatic disease. MRI LUMBAR SPINE When compared to the 07/20/2013 lumbar spine MR, there has been marked progression of metastatic involvement now noted involving will be lumbar vertebra, sacrum and visualized aspect of the ilium. Large amount of epidural tumor L4 level fills majority of the canal with moderate to marked thecal sac narrowing. Epidural tumor extends into the neural foramen bilaterally and into the left psoas muscle as well as bordering the right psoas muscle. Marked mass effect upon the exiting left L4 nerve root and mild mass effect upon the exiting right L4 nerve root. Epidural tumor extends below the L4-5 disc space in a left paracentral position filling the left lateral recess with mass effect upon the left L5 nerve root with tumor seen to the L5 pedicle level. Epidural tumor on the left at the L3 level extends from the medial aspect of the left pedicle into left neural foramen with narrowing of the left lateral aspect of the thecal sac and compressing the left L3 nerve root. Epidural tumor posterior to the L1 vertebral body with narrowing of the ventral aspect of thecal sac crowding the ventral nerve roots and conus which is not significantly compressed. Chronic L2 compression fracture with mild retropulsion posterior superior aspect. Please see above for further detail. These results were called by telephone at the time of  interpretation on 01/31/2016 at 6:31 pm to Dr. Conni Slipper , who verbally acknowledged these results. Electronically Signed   By: Genia Del M.D.   On: 01/31/2016 18:29   Mr Lumbar Spine W Wo Contrast  01/31/2016  CLINICAL DATA:  50 year old male with lung cancer and known osseous metastatic disease. Bilateral leg numbness. Unable to walk. Subsequent encounter. EXAM: MRI THORACIC AND LUMBAR SPINE WITHOUT AND WITH CONTRAST TECHNIQUE: Multiplanar and multiecho pulse sequences of the thoracic and lumbar spine were obtained without and with intravenous contrast. CONTRAST:  30m MULTIHANCE GADOBENATE DIMEGLUMINE 529 MG/ML IV SOLN COMPARISON:  12/25/2015 CT of the chest, abdomen pelvis. MR thoracic and cervical spine 10/30/2015. MR of the lumbar spine 05/20/2014. FINDINGS: MRI THORACIC SPINE FINDINGS On the sagittal sequence obtained for proper level assignment which includes the cervical spine, there has been progression of diffuse osseous metastatic disease throughout the cervical spine when compared to the 10/30/2015 cervical spine MR. Most notable change is metastatic involvement of C1, C2, C6 and C7. Spread of tumor into the C6-7 and C7-T1 neural foramen and into surrounding paraspinal muscles greater on the left. Epidural extension of tumor greater on the left incompletely assessed on the current exam Diffuse metastatic disease throughout the thoracic spine. T2 compression fracture most notable centrally with 85% loss height centrally. Mild retropulsion of the compressed vertebra contributing to narrowing of the ventral aspect of the thecal sac greater on the left with minimal cord contact similar to prior exam. T5 compression fracture with 80% loss height centrally. Mild retropulsion posterior aspect and narrowing of the ventral thecal sac with minimal cord contact. Similar to prior exam. Bony expansion from metastatic involvement of left T5 pedicle with slight flattening of the left aspect of the thecal sac.  Epidural tumor posterior to the T9 vertebral body with narrowing ventral thecal sac and mild cord contact. Minimal epidural tumor left paracentral position T7 and bilaterally T8 new from prior exam. 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 Large lung lesion and adrenal and liver metastatic disease. MRI LUMBAR SPINE FINDINGS When compared to the 07/20/2013 lumbar  spine MR, there has been marked progression of metastatic involvement now noted involving will be lumbar vertebra, sacrum and visualized aspect of the ilium. Chronic anterior wedge compression fracture of the L2 vertebral body involving superior endplate with 96% loss height anteriorly. Slight retropulsion posterior superior aspect with slight flattening of the ventral thecal sac. Conus upper L2 level. New epidural extension of tumor posterior to the L1 vertebral body with narrowing of the ventral aspect of thecal sac crowding the ventral nerve roots and conus which is not significantly compressed. Epidural tumor on the left at the L3 level extends from the medial aspect of the left pedicle into left neural foramen with narrowing of the left lateral aspect of the thecal sac and compressing the left L3 nerve root. Large amount of epidural tumor L4 level fills majority of the canal with moderate to marked thecal sac narrowing. Epidural tumor extends into the neural foramen bilaterally and into the left psoas muscle as well as bordering the right psoas muscle. Marked mass effect upon the exiting left L4 nerve root and mild mass effect upon the exiting right L4 nerve root. Epidural tumor extends below the L4-5 disc space in a left paracentral position filling the left lateral recess with mass effect upon the left L5 nerve root with tumor seen to the L5 pedicle level. Epidural tumor borders the left S1 and S2 foramen. IMPRESSION: MRI THORACIC SPINE On the sagittal sequence obtained for proper level assignment which includes the  cervical spine, there has been progression of diffuse osseous metastatic disease throughout the cervical spine when compared to the 10/30/2015 cervical spine MR. Most notable change is metastatic involvement of C1, C2, C6 and C7. Spread of tumor into the C6-7 and C7-T1 neural foramen and into surrounding paraspinal muscles greater on the left. Epidural extension of tumor greater on the left incompletely assessed on the current exam. Diffuse metastatic disease throughout the thoracic spine. T2 compression fracture with 85% loss height centrally. Mild retropulsion of the compressed vertebra contributing to narrowing of the ventral aspect of the thecal sac greater on the left with minimal cord contact similar to prior exam. T5 compression fracture with 80% loss height centrally. Mild retropulsion posterior aspect and narrowing of the ventral thecal sac with minimal cord contact. Similar to prior exam. Bony expansion from metastatic involvement of left T5 pedicle with slight flattening of the left aspect of the thecal sac. Epidural tumor posterior to the T9 vertebral body with narrowing ventral thecal sac and mild cord contact. Minimal epidural tumor left paracentral position T7 and bilaterally T8 new from prior exam. No high-grade compression of the thoracic cord. 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 Large lung lesion and adrenal and liver metastatic disease. MRI LUMBAR SPINE When compared to the 07/20/2013 lumbar spine MR, there has been marked progression of metastatic involvement now noted involving will be lumbar vertebra, sacrum and visualized aspect of the ilium. Large amount of epidural tumor L4 level fills majority of the canal with moderate to marked thecal sac narrowing. Epidural tumor extends into the neural foramen bilaterally and into the left psoas muscle as well as bordering the right psoas muscle. Marked mass effect upon the exiting left L4 nerve root and mild mass  effect upon the exiting right L4 nerve root. Epidural tumor extends below the L4-5 disc space in a left paracentral position filling the left lateral recess with mass effect upon the left L5 nerve root with tumor seen to the L5 pedicle  level. Epidural tumor on the left at the L3 level extends from the medial aspect of the left pedicle into left neural foramen with narrowing of the left lateral aspect of the thecal sac and compressing the left L3 nerve root. Epidural tumor posterior to the L1 vertebral body with narrowing of the ventral aspect of thecal sac crowding the ventral nerve roots and conus which is not significantly compressed. Chronic L2 compression fracture with mild retropulsion posterior superior aspect. Please see above for further detail. These results were called by telephone at the time of interpretation on 01/31/2016 at 6:31 pm to Dr. Conni Slipper , who verbally acknowledged these results. Electronically Signed   By: Genia Del M.D.   On: 01/31/2016 18:29      Management plans discussed with the patient, family and they are in agreement.  CODE STATUS:     Code Status Orders        Start     Ordered   01/31/16 2059  Do not attempt resuscitation (DNR)   Continuous    Question Answer Comment  In the event of cardiac or respiratory ARREST Do not call a "code blue"   In the event of cardiac or respiratory ARREST Do not perform Intubation, CPR, defibrillation or ACLS   In the event of cardiac or respiratory ARREST Use medication by any route, position, wound care, and other measures to relive pain and suffering. May use oxygen, suction and manual treatment of airway obstruction as needed for comfort.      01/31/16 2058    Code Status History    Date Active Date Inactive Code Status Order ID Comments User Context   01/31/2016  7:59 PM 01/31/2016  8:58 PM Full Code [id number]  Gladstone Lighter, MD Inpatient   01/12/2016 12:47 AM 01/16/2016 10:02 PM Full Code [id number]  Lance Coon,  MD Inpatient   09/30/2015  7:53 PM 10/03/2015  4:32 PM Full Code [id number]  Idelle Crouch, MD Inpatient   09/10/2015  1:58 PM 09/12/2015  9:13 PM Full Code [id number]  Loletha Grayer, MD ED   05/29/2015 10:13 AM 05/30/2015  3:29 AM Full Code [id number]  Inez Catalina, MD [place]   03/07/2014  7:04 PM 03/11/2014  6:11 PM Full Code [id number]  Kristeen Miss, MD Inpatient   03/06/2014 10:45 PM 03/07/2014  7:04 PM Full Code [id number]  Shanda Howells, MD Inpatient      TOTAL TIME TAKING CARE OF THIS PATIENT: 40 minutes.    Henreitta Leber M.D on 02/01/2016 at 1:05 PM  Between 7am to 6pm - Pager - [phone]  After 6pm go to [email or web address] - password [email or web address] [facility]  Office  [phone]  CC: Primary care physician; PROVIDER NOT IN SYSTEM

## 2016-02-01 NOTE — Progress Notes (Signed)
3:13pm Phone call to Mya, patient's spouse, and she stated she would like patient to arrive home at 5:00pm if possible. Hospital bed has been delivered and they are in the process of setting it up in the home. DNR has been signed. Will call EMS for transport at approximately 4:00pm today. All information faxed to referral intake. Thank you for the opportunity to care for this patient. Ebbie Ridge MSN, [facility] Liaison

## 2016-02-01 NOTE — Progress Notes (Signed)
Patient discharged home with hospice per MD order. Prescription given to patient. All discharge instructions given and all questions answered. EMS called for transport.

## 2016-02-01 NOTE — Telephone Encounter (Signed)
Returned Hilda Blades from [facility] phone call regarding a direct admit from Hospital to hospice care.  Per Dr. Jacinto Reap RN pt hospice appropriate.  Verbal order given for hospice evaluation and admission into hospice care.  Per Hospice ok to have Dr. B sign order upon returning to office Monday.

## 2016-02-01 NOTE — Progress Notes (Signed)
Received a newly admitted Patient from the ED. Came to the unit per stretcher, escorted by ED Tech. Patient awake, conscious and coherent, not in any form of distress. Admitted with a medical problem of weakness. Routine admission care provided. Orientation to room rendered. Comforts and safety measures in place. Needs attended.

## 2016-02-04 ENCOUNTER — Telehealth: Payer: Self-pay | Admitting: *Deleted

## 2016-02-04 NOTE — Telephone Encounter (Signed)
Called asking if his Oxycodone 5 mg can be changed form every 8 hours to every 4 hours. Family not ready to increase Fentanyl yet, but wants him to have med available if needed

## 2016-02-04 NOTE — Telephone Encounter (Signed)
Per Dr B ok to change Oxycodone to q 4 h prn. Sarah informed

## 2016-02-05 LAB — CULTURE, BLOOD (ROUTINE X 2)
Culture: NO GROWTH
Culture: NO GROWTH

## 2016-02-06 ENCOUNTER — Telehealth: Payer: Self-pay | Admitting: *Deleted

## 2016-02-06 MED ORDER — MORPHINE SULFATE ER 15 MG PO TBCR: 15.0000 mg | EXTENDED_RELEASE_TABLET | Freq: Two times a day (BID) | ORAL | 0 refills | Status: AC

## 2016-02-06 MED ORDER — LORAZEPAM 0.5 MG PO TABS: 0.5000 mg | ORAL_TABLET | Freq: Three times a day (TID) | ORAL | 0 refills | Status: AC

## 2016-02-06 NOTE — Telephone Encounter (Signed)
Called to request a change in his pain meds as he is so thin she does not think the Fentanyl is absorbing and he is using the Oxycodone 10 mg every 4 hours. Requesting to stop fentanyl and start him on MS Contin for control and increased shortness of breath. Also requesting med for anxiety.Please advise

## 2016-02-06 NOTE — Telephone Encounter (Signed)
Per Dr B, Stop fentanyl and start MS Contin 30 mg bid, Continue Oxycodone, Ativan 0.5 mg tid prn ordered as well. Message left for Sarah.

## 2016-02-08 ENCOUNTER — Inpatient Hospital Stay: Payer: BLUE CROSS/BLUE SHIELD | Admitting: Internal Medicine

## 2016-02-08 ENCOUNTER — Inpatient Hospital Stay: Payer: BLUE CROSS/BLUE SHIELD

## 2016-02-14 ENCOUNTER — Telehealth: Payer: Self-pay | Admitting: *Deleted

## 2016-03-14 NOTE — Telephone Encounter (Signed)
Called to inform us that Luis [name] died at 1039 this morning

## 2016-03-14 DEATH — deceased

## 2016-06-21 ENCOUNTER — Other Ambulatory Visit: Payer: Self-pay | Admitting: Nurse Practitioner

## 2016-11-04 IMAGING — CR DG CHEST 1V
1 series · 1 of 1 positions shown · non-contrast
Comparison: 05/16/2015

CLINICAL DATA: Status post left lung biopsy

EXAM:
CHEST  1 VIEW

[ap]
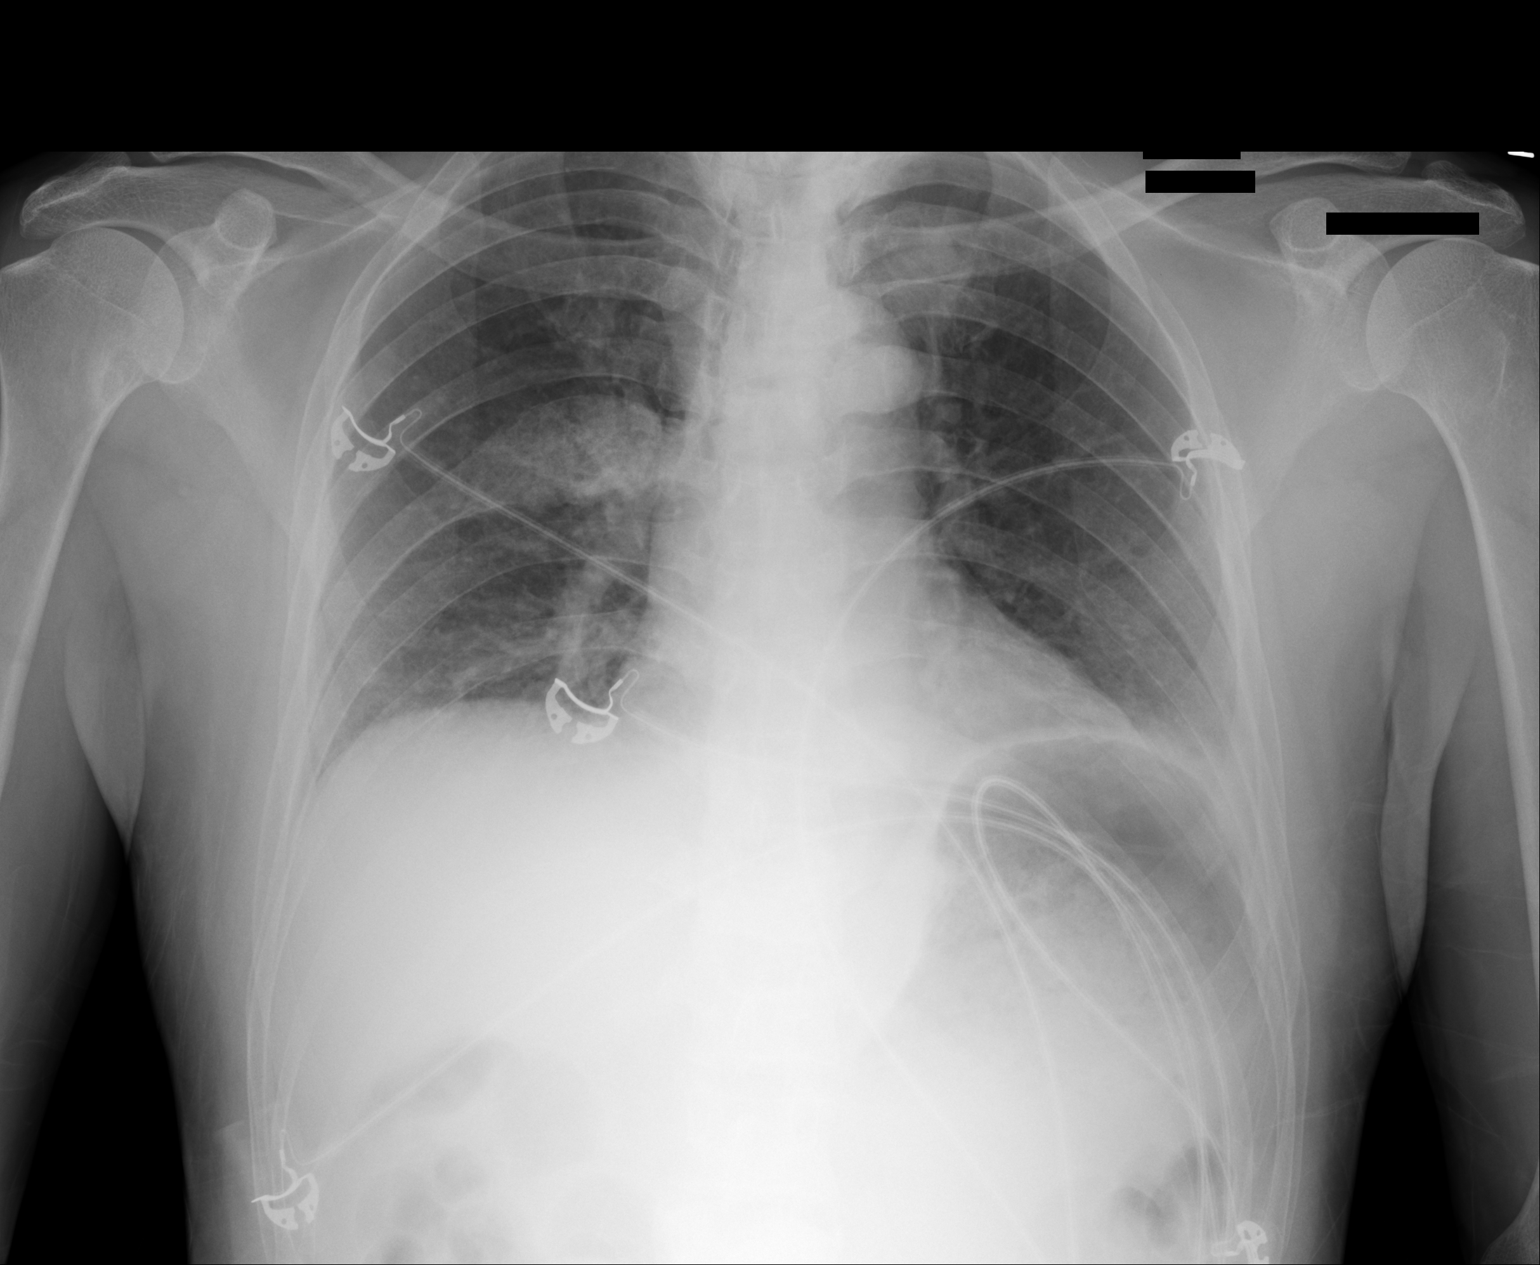

[1 of 1 positions shown; findings below may reference images not displayed]

FINDINGS: The known left upper lobe mass lesion is again identified. No
pneumothorax is seen. Expansile lesion of the right seventh rib is
noted posteriorly stable from the previous CT.
IMPRESSION: No evidence of post biopsy pneumothorax. Patient is scheduled for
discharge to home.

## 2016-11-04 IMAGING — CT CT BIOPSY
1 of 3 series · 13 of 32 positions shown, 19 images · non-contrast
Comparison: none

CLINICAL DATA: Known left lung cancer, repeat biopsy for mutation
testing

[Series 3: biopsy 2.4 b30s · axial · 0.74mm/px · z∈[-378,-366]mm · 13 of 48 slices shown, 19 images]
[im 4/48  soft-tissue]
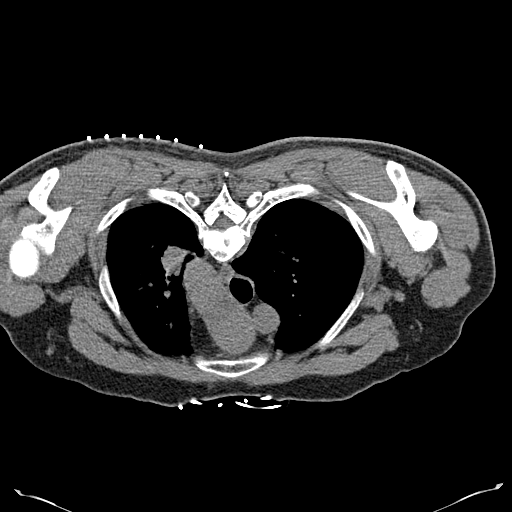
[im 4/48  bone]
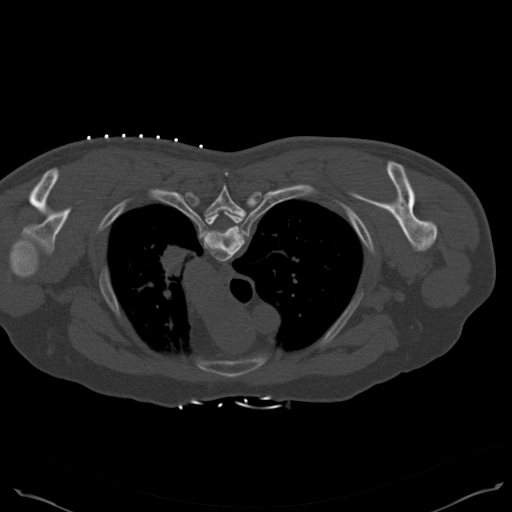
[im 7/48  soft-tissue]
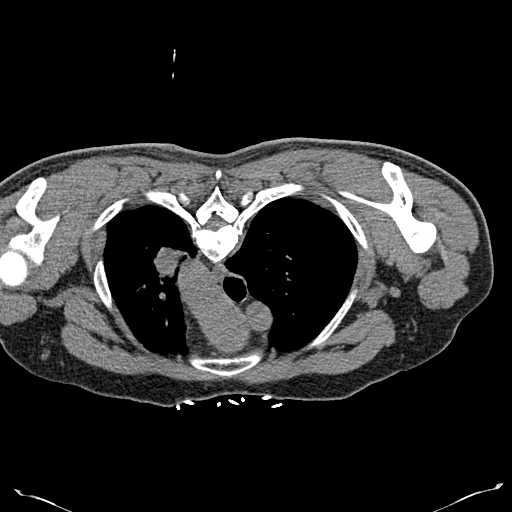
[im 11/48  soft-tissue]
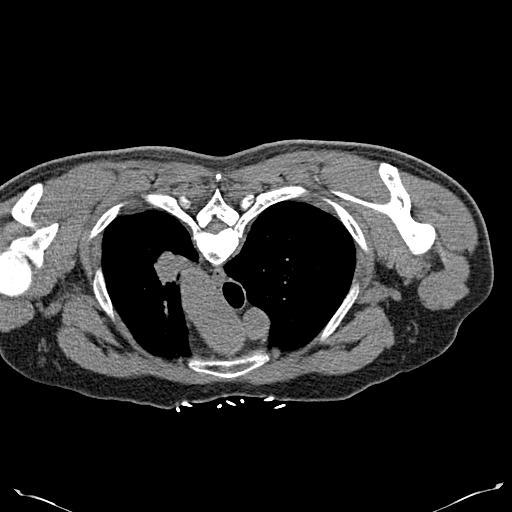
[im 14/48  soft-tissue]
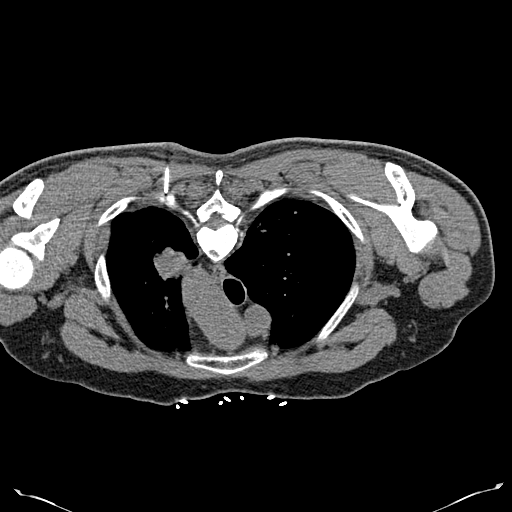
[im 17/48  soft-tissue]
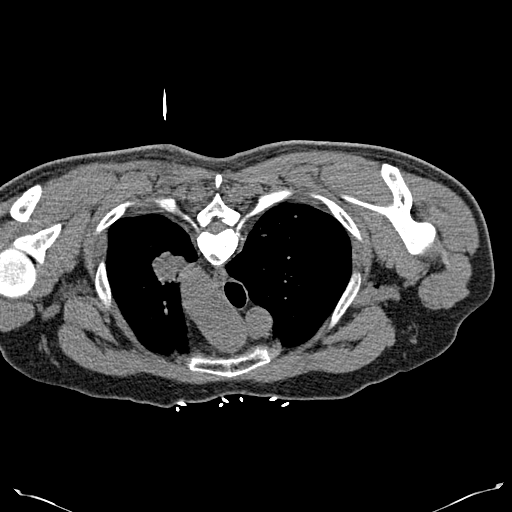
[im 21/48  soft-tissue]
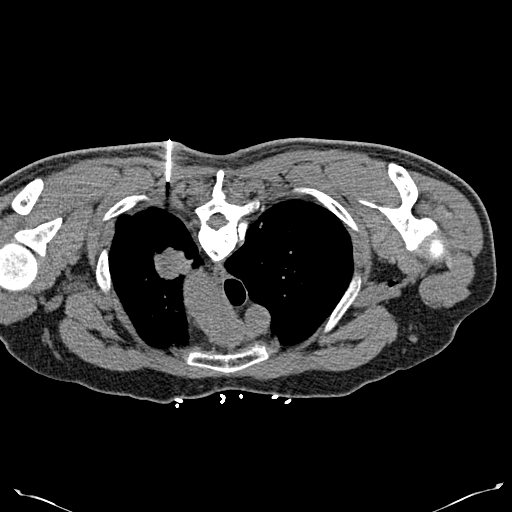
[im 24/48  soft-tissue]
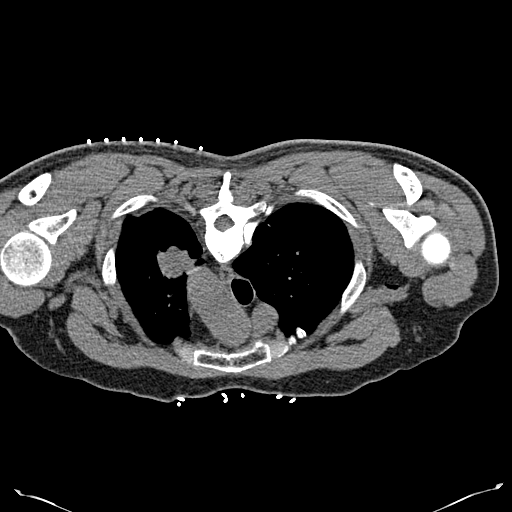
[im 27/48  soft-tissue]
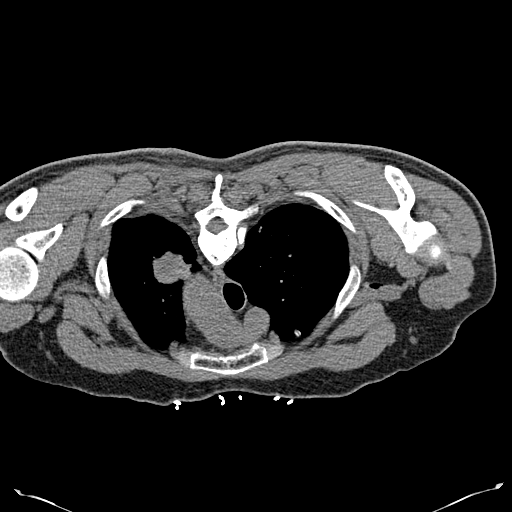
[im 31/48  soft-tissue]
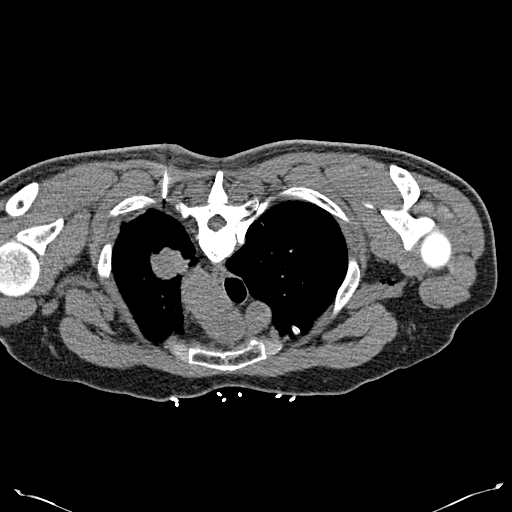
[im 31/48  bone]
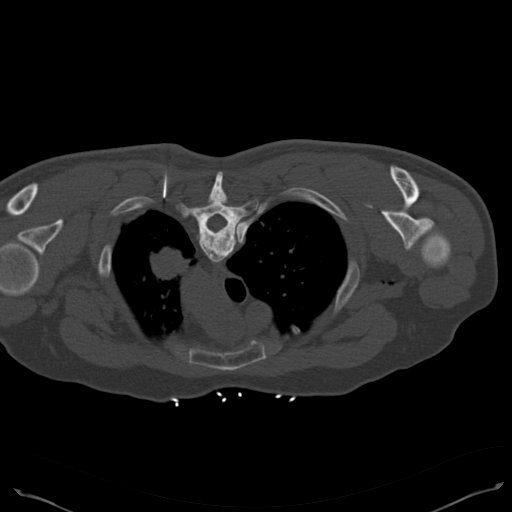
[im 34/48  soft-tissue]
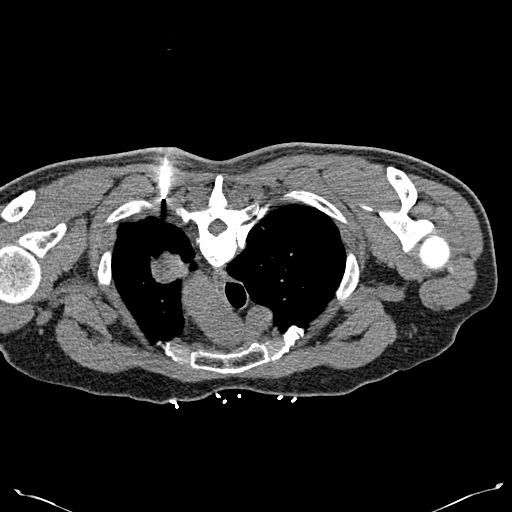
[im 34/48  lung]
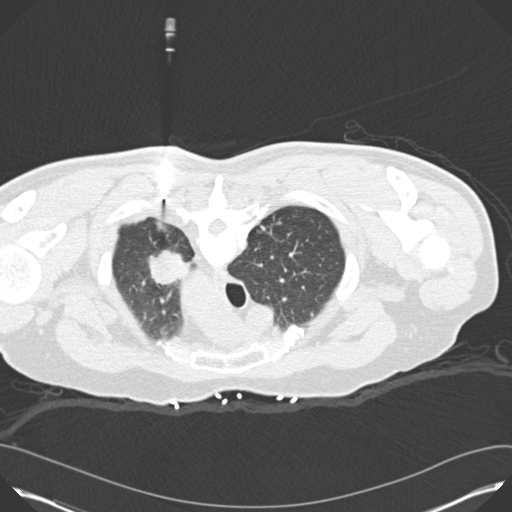
[im 37/48  soft-tissue]
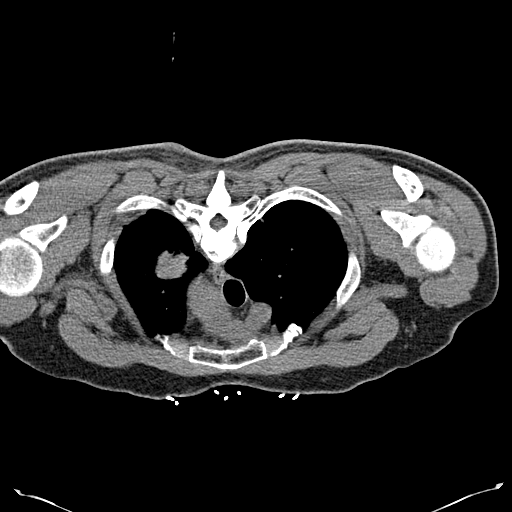
[im 37/48  lung]
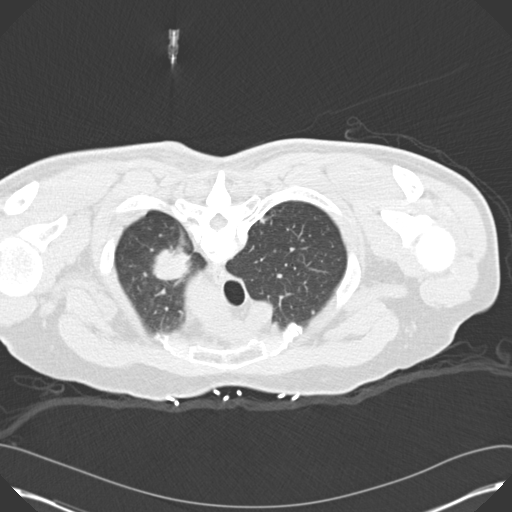
[im 41/48  soft-tissue]
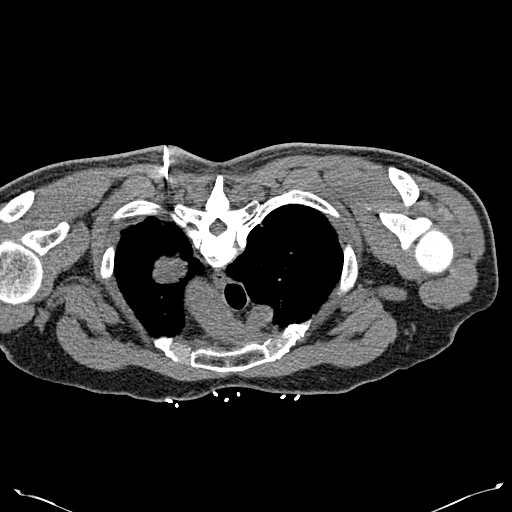
[im 41/48  lung]
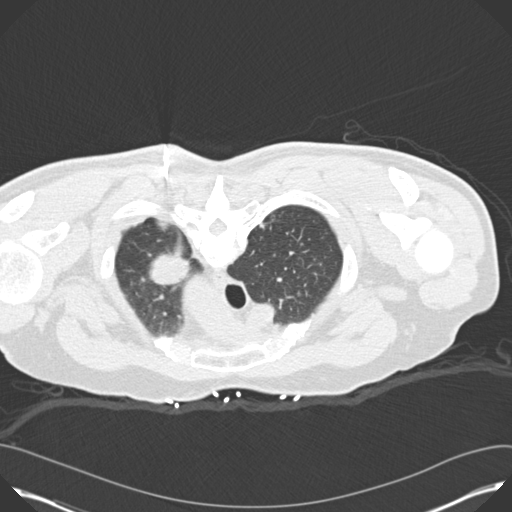
[im 44/48  soft-tissue]
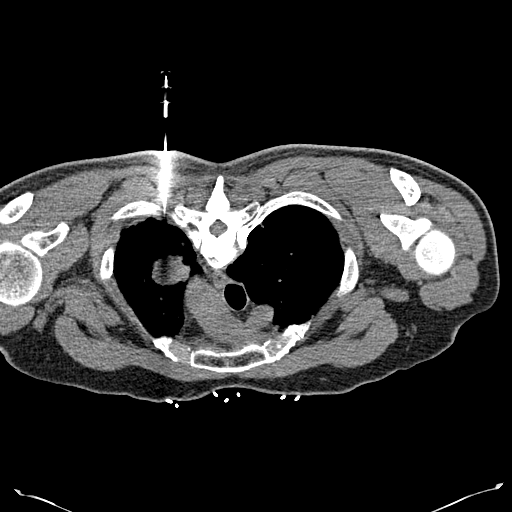
[im 44/48  lung]
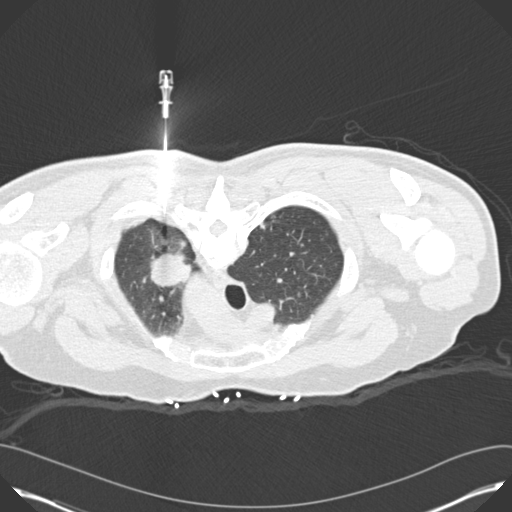

[13 of 32 positions shown; findings below may reference images not displayed]

EXAM:
CT GUIDED NEEDLE ASPIRATE BIOPSY OF BODY PART

ANESTHESIA/SEDATION:
None

PROCEDURE:
The procedure risks, benefits, and alternatives were explained to
the patient. Questions regarding the procedure were encouraged and
answered. The patient understands and consents to the procedure.

The left posterior chest wall was prepped with chlorhexidinein a
sterile fashion, and a sterile drape was applied covering the
operative field. A sterile gown and sterile gloves were used for the
procedure. Local anesthesia was provided with 1% Lidocaine.

Utilizing CT fluoroscopic guidance a 17 gauge guiding needle was
placed percutaneously into the left upper lobe adjacent to the known
mass lesion. Multiple 18 gauge core biopsies were then obtained. The
guiding needle was then removed and a blood patch placed to aid in
prevention of pneumothorax. Followup scanning shows no significant
pneumothorax. The patient tolerated the procedure well was returned
to his room in satisfactory condition.

COMPLICATIONS:
None immediate
IMPRESSION: Successful CT-guided lung biopsy on the left as described.
